# Patient Record
Sex: Female | Born: 1955 | Race: White | Hispanic: No | Marital: Married | Smoking: Former smoker
Health system: Southern US, Community
[De-identification: ages and names within clinical notes are randomized; demographics above are authoritative.]

## PROBLEM LIST (undated history)

## (undated) DIAGNOSIS — M5431 Sciatica, right side: Secondary | ICD-10-CM

## (undated) DIAGNOSIS — M797 Fibromyalgia: Secondary | ICD-10-CM

## (undated) DIAGNOSIS — M1990 Unspecified osteoarthritis, unspecified site: Secondary | ICD-10-CM

## (undated) DIAGNOSIS — I639 Cerebral infarction, unspecified: Secondary | ICD-10-CM

## (undated) DIAGNOSIS — Z87442 Personal history of urinary calculi: Secondary | ICD-10-CM

## (undated) DIAGNOSIS — I10 Essential (primary) hypertension: Secondary | ICD-10-CM

## (undated) DIAGNOSIS — F329 Major depressive disorder, single episode, unspecified: Secondary | ICD-10-CM

## (undated) DIAGNOSIS — R87619 Unspecified abnormal cytological findings in specimens from cervix uteri: Secondary | ICD-10-CM

## (undated) DIAGNOSIS — Z8673 Personal history of transient ischemic attack (TIA), and cerebral infarction without residual deficits: Secondary | ICD-10-CM

## (undated) DIAGNOSIS — F32A Depression, unspecified: Secondary | ICD-10-CM

## (undated) DIAGNOSIS — F419 Anxiety disorder, unspecified: Secondary | ICD-10-CM

## (undated) DIAGNOSIS — I493 Ventricular premature depolarization: Secondary | ICD-10-CM

## (undated) DIAGNOSIS — K219 Gastro-esophageal reflux disease without esophagitis: Secondary | ICD-10-CM

## (undated) DIAGNOSIS — N189 Chronic kidney disease, unspecified: Secondary | ICD-10-CM

## (undated) HISTORY — DX: Personal history of transient ischemic attack (TIA), and cerebral infarction without residual deficits: Z86.73

## (undated) HISTORY — DX: Essential (primary) hypertension: I10

## (undated) HISTORY — PX: APPENDECTOMY: SHX54

## (undated) HISTORY — DX: Major depressive disorder, single episode, unspecified: F32.9

## (undated) HISTORY — DX: Anxiety disorder, unspecified: F41.9

## (undated) HISTORY — PX: ADENOIDECTOMY: SUR15

## (undated) HISTORY — DX: Sciatica, right side: M54.31

## (undated) HISTORY — DX: Unspecified abnormal cytological findings in specimens from cervix uteri: R87.619

## (undated) HISTORY — DX: Depression, unspecified: F32.A

## (undated) HISTORY — PX: WRIST SURGERY: SHX841

## (undated) HISTORY — DX: Chronic kidney disease, unspecified: N18.9

## (undated) HISTORY — PX: TUBAL LIGATION: SHX77

## (undated) HISTORY — PX: BUNIONECTOMY: SHX129

## (undated) HISTORY — DX: Ventricular premature depolarization: I49.3

## (undated) HISTORY — PX: TONSILLECTOMY: SUR1361

---

## 1998-09-27 ENCOUNTER — Ambulatory Visit (HOSPITAL_COMMUNITY): Admission: RE | Admit: 1998-09-27 | Discharge: 1998-09-27 | Payer: Self-pay | Admitting: Neurosurgery

## 1998-09-27 ENCOUNTER — Encounter: Payer: Self-pay | Admitting: Neurosurgery

## 1999-04-21 ENCOUNTER — Inpatient Hospital Stay (HOSPITAL_COMMUNITY): Admission: EM | Admit: 1999-04-21 | Discharge: 1999-04-28 | Payer: Self-pay | Admitting: Neurosurgery

## 1999-04-22 ENCOUNTER — Encounter: Payer: Self-pay | Admitting: Neurosurgery

## 1999-04-26 ENCOUNTER — Encounter: Payer: Self-pay | Admitting: Neurosurgery

## 1999-09-17 ENCOUNTER — Ambulatory Visit (HOSPITAL_COMMUNITY): Admission: RE | Admit: 1999-09-17 | Discharge: 1999-09-17 | Payer: Self-pay | Admitting: Neurosurgery

## 1999-09-17 ENCOUNTER — Encounter: Payer: Self-pay | Admitting: Neurosurgery

## 2002-01-08 ENCOUNTER — Ambulatory Visit (HOSPITAL_COMMUNITY): Admission: RE | Admit: 2002-01-08 | Discharge: 2002-01-08 | Payer: Self-pay | Admitting: Unknown Physician Specialty

## 2002-01-08 ENCOUNTER — Encounter: Payer: Self-pay | Admitting: Unknown Physician Specialty

## 2002-01-29 ENCOUNTER — Encounter (HOSPITAL_COMMUNITY): Admission: RE | Admit: 2002-01-29 | Discharge: 2002-02-28 | Payer: Self-pay | Admitting: Orthopaedic Surgery

## 2002-02-27 ENCOUNTER — Encounter (HOSPITAL_COMMUNITY): Admission: RE | Admit: 2002-02-27 | Discharge: 2002-03-29 | Payer: Self-pay | Admitting: Orthopaedic Surgery

## 2002-03-30 ENCOUNTER — Encounter (HOSPITAL_COMMUNITY): Admission: RE | Admit: 2002-03-30 | Discharge: 2002-04-29 | Payer: Self-pay | Admitting: Orthopaedic Surgery

## 2002-04-29 ENCOUNTER — Encounter (HOSPITAL_COMMUNITY): Admission: RE | Admit: 2002-04-29 | Discharge: 2002-05-29 | Payer: Self-pay | Admitting: Orthopaedic Surgery

## 2003-02-02 ENCOUNTER — Inpatient Hospital Stay (HOSPITAL_COMMUNITY): Admission: AD | Admit: 2003-02-02 | Discharge: 2003-02-06 | Payer: Self-pay | Admitting: Pulmonary Disease

## 2003-07-07 ENCOUNTER — Ambulatory Visit (HOSPITAL_COMMUNITY): Admission: RE | Admit: 2003-07-07 | Discharge: 2003-07-07 | Payer: Self-pay | Admitting: Pulmonary Disease

## 2004-10-26 ENCOUNTER — Ambulatory Visit (HOSPITAL_COMMUNITY): Admission: RE | Admit: 2004-10-26 | Discharge: 2004-10-26 | Payer: Self-pay | Admitting: Pulmonary Disease

## 2004-10-27 ENCOUNTER — Ambulatory Visit: Payer: Self-pay | Admitting: *Deleted

## 2006-09-06 ENCOUNTER — Ambulatory Visit (HOSPITAL_COMMUNITY): Admission: RE | Admit: 2006-09-06 | Discharge: 2006-09-06 | Payer: Self-pay | Admitting: Pulmonary Disease

## 2006-11-13 ENCOUNTER — Ambulatory Visit (HOSPITAL_COMMUNITY): Admission: RE | Admit: 2006-11-13 | Discharge: 2006-11-13 | Payer: Self-pay | Admitting: Pulmonary Disease

## 2007-02-24 ENCOUNTER — Encounter (INDEPENDENT_AMBULATORY_CARE_PROVIDER_SITE_OTHER): Payer: Self-pay | Admitting: General Surgery

## 2007-02-24 ENCOUNTER — Observation Stay (HOSPITAL_COMMUNITY): Admission: RE | Admit: 2007-02-24 | Discharge: 2007-02-25 | Payer: Self-pay | Admitting: Obstetrics and Gynecology

## 2007-05-01 ENCOUNTER — Ambulatory Visit (HOSPITAL_COMMUNITY): Admission: RE | Admit: 2007-05-01 | Discharge: 2007-05-01 | Payer: Self-pay | Admitting: Pulmonary Disease

## 2007-05-05 ENCOUNTER — Ambulatory Visit (HOSPITAL_COMMUNITY): Admission: RE | Admit: 2007-05-05 | Discharge: 2007-05-05 | Payer: Self-pay | Admitting: Obstetrics and Gynecology

## 2007-09-26 ENCOUNTER — Ambulatory Visit (HOSPITAL_COMMUNITY): Admission: RE | Admit: 2007-09-26 | Discharge: 2007-09-26 | Payer: Self-pay | Admitting: Pulmonary Disease

## 2007-09-26 ENCOUNTER — Ambulatory Visit: Payer: Self-pay | Admitting: Cardiology

## 2007-09-30 ENCOUNTER — Ambulatory Visit (HOSPITAL_COMMUNITY): Admission: RE | Admit: 2007-09-30 | Discharge: 2007-09-30 | Payer: Self-pay | Admitting: Pulmonary Disease

## 2008-02-23 ENCOUNTER — Ambulatory Visit: Payer: Self-pay | Admitting: Orthopedic Surgery

## 2008-02-23 DIAGNOSIS — M21619 Bunion of unspecified foot: Secondary | ICD-10-CM

## 2008-03-23 ENCOUNTER — Ambulatory Visit: Admission: RE | Admit: 2008-03-23 | Discharge: 2008-03-23 | Payer: Self-pay | Admitting: Pulmonary Disease

## 2008-04-30 ENCOUNTER — Ambulatory Visit (HOSPITAL_COMMUNITY): Admission: RE | Admit: 2008-04-30 | Discharge: 2008-04-30 | Payer: Self-pay | Admitting: Pulmonary Disease

## 2008-05-06 ENCOUNTER — Ambulatory Visit (HOSPITAL_COMMUNITY): Admission: RE | Admit: 2008-05-06 | Discharge: 2008-05-06 | Payer: Self-pay | Admitting: Pulmonary Disease

## 2008-09-17 ENCOUNTER — Ambulatory Visit (HOSPITAL_COMMUNITY): Admission: RE | Admit: 2008-09-17 | Discharge: 2008-09-17 | Payer: Self-pay | Admitting: Pulmonary Disease

## 2008-10-07 ENCOUNTER — Emergency Department (HOSPITAL_COMMUNITY): Admission: EM | Admit: 2008-10-07 | Discharge: 2008-10-07 | Payer: Self-pay | Admitting: Emergency Medicine

## 2008-11-26 ENCOUNTER — Other Ambulatory Visit: Admission: RE | Admit: 2008-11-26 | Discharge: 2008-11-26 | Payer: Self-pay | Admitting: Obstetrics and Gynecology

## 2008-12-01 ENCOUNTER — Ambulatory Visit (HOSPITAL_COMMUNITY): Admission: RE | Admit: 2008-12-01 | Discharge: 2008-12-01 | Payer: Self-pay | Admitting: Obstetrics and Gynecology

## 2009-01-11 ENCOUNTER — Ambulatory Visit (HOSPITAL_COMMUNITY): Admission: RE | Admit: 2009-01-11 | Discharge: 2009-01-11 | Payer: Self-pay | Admitting: Obstetrics and Gynecology

## 2009-01-14 ENCOUNTER — Ambulatory Visit (HOSPITAL_COMMUNITY): Admission: RE | Admit: 2009-01-14 | Discharge: 2009-01-14 | Payer: Self-pay | Admitting: Obstetrics and Gynecology

## 2009-05-09 ENCOUNTER — Ambulatory Visit (HOSPITAL_COMMUNITY): Admission: RE | Admit: 2009-05-09 | Discharge: 2009-05-09 | Payer: Self-pay | Admitting: Obstetrics and Gynecology

## 2010-01-21 ENCOUNTER — Emergency Department (HOSPITAL_COMMUNITY): Admission: EM | Admit: 2010-01-21 | Discharge: 2010-01-21 | Payer: Self-pay | Admitting: Emergency Medicine

## 2010-06-13 ENCOUNTER — Ambulatory Visit (HOSPITAL_COMMUNITY): Admission: RE | Admit: 2010-06-13 | Discharge: 2010-06-13 | Payer: Self-pay | Admitting: Obstetrics and Gynecology

## 2010-09-10 ENCOUNTER — Encounter: Payer: Self-pay | Admitting: Neurology

## 2010-11-28 LAB — SEDIMENTATION RATE: Sed Rate: 6 mm/hr (ref 0–22)

## 2010-11-28 LAB — BASIC METABOLIC PANEL
BUN: 11 mg/dL (ref 6–23)
CO2: 25 mEq/L (ref 19–32)
Calcium: 9.3 mg/dL (ref 8.4–10.5)
Chloride: 102 mEq/L (ref 96–112)
Creatinine, Ser: 0.71 mg/dL (ref 0.4–1.2)
GFR calc Af Amer: >60 mL/min (ref >60)
GFR calc non Af Amer: >60 mL/min (ref >60)
Glucose, Bld: 93 mg/dL (ref 70–99)
Potassium: 4.5 mEq/L (ref 3.5–5.1)
Sodium: 135 mEq/L (ref 135–145)

## 2010-11-28 LAB — CBC
HCT: 37.5 % (ref 36.0–46.0)
Hemoglobin: 12.9 g/dL (ref 12.0–15.0)
MCHC: 34.5 g/dL (ref 30.0–36.0)
MCV: 84.9 fL (ref 78.0–100.0)
Platelets: 331 10*3/uL (ref 150–400)
RBC: 4.42 MIL/uL (ref 3.87–5.11)
RDW: 15.8 % — ABNORMAL HIGH (ref 11.5–15.5)
WBC: 10.7 10*3/uL — ABNORMAL HIGH (ref 4.0–10.5)

## 2011-01-02 NOTE — H&P (Signed)
NAMEJALAYA, [name]           ACCOUNT NO.:  [id number]   MEDICAL RECORD NO.:  [id number]          PATIENT TYPE:  AMB   LOCATION:  DAY                           FACILITY:  [facility]   PHYSICIAN:  Tilda Burrow, M.D. DATE OF BIRTH:  19-Oct-1955   DATE OF ADMISSION:  DATE OF DISCHARGE:  LH                              HISTORY & PHYSICAL   ADMISSION DIAGNOSES:  1. Pelvic relaxation with rectocele.  2. Chronic fatigue syndrome.  3. 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   HISTORY OF PRESENT ILLNESS:  This 55 year old female is admitted at this  time for posterior repair for symptomatic rectocele which is interfering  with defecation. She also has a small hemorrhoid skin tag which he needs  removed at the same time. Dr. Lovell Sheehan will be consulted to take care of  this. General medical history is extensive. She has a history of a  suspected fibromyalgia managed by Dr. Sandria Manly and Dr. Juanetta Gosling. She sees Dr.  Loa Socks, and she is know to Dr. Channing Mutters for her concerns over deterioration  of vision in the right eye. Her general energy levels have decreased  markedly over the past few years, and with the last decade, she has gone  from high energy back to a person who is someone who sleeps 12 hours a  day and usually sleeps on a sofa. She complains of losing motor strength  in her right hand with dropping of things. She never feels caught up on  rest and has a low stamina. 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  Dr. Nile Riggs has seen her. She now complains of having a foggy sensation  in her right eye.   MEDICATIONS:  1. Cymbalta 60 mg at bedtime.  2. Omeprazole 40 mg p.o. daily along with sodium bicarbonate.  3. Albuterol as taken daily.  4. She is on Pulmicort 180 mcg 4 puffs twice daily.   ALLERGIES:  SHE CANNOT TAKE LYRICA AS IT INCREASES HER ASTHMA.   SOCIAL HISTORY:  She is married.  Four children at home  ages 64, 66, 27,  and 52.   GENERAL EXAM:  Shows a cheerful, moderately overweight, Caucasian female  who is alert and oriented x3 but quite aware of her changes in her  overall condition.  Pupils are equal, round, reactive.  NECK:  Supple.  CHEST:  Clear to auscultation.  ABDOMEN:  Without masses.  EXTERNAL GENITALIA:  Lax introitus. Rectocele present which is  symptomatic. Vaginal apex is well supported.  EXTREMITIES:  Are within normal limits without clubbing, cyanosis, or  edema. She walks without any gait abnormalities in the office.   PLAN:  Posterior repair February 24, 2007, removal of hemorrhoidal skin tag  by Dr. Lovell Sheehan when available.   The patient has questions regarding Lyme disease today. Will investigate  this with patient after surgery.      Tilda Burrow, M.D.  Electronically Signed     JVF/MEDQ  D:  02/20/2007  T:  02/21/2007  Job:  [id number]  cc:   [facility]

## 2011-01-02 NOTE — Op Note (Signed)
[name], [name]           ACCOUNT NO.:  [id number]   MEDICAL RECORD NO.:  [id number]          PATIENT TYPE:  INP   LOCATION:  A208                          FACILITY:  [facility]   PHYSICIAN:  Tilda Burrow, M.D. DATE OF BIRTH:  1956-03-13   DATE OF PROCEDURE:  02/24/2007  DATE OF DISCHARGE:                               OPERATIVE REPORT   PREOPERATIVE DIAGNOSES:  1. Rectocele, symptomatic.  2. External hemorrhoids.   POSTOPERATIVE DIAGNOSES:  1. Rectocele, symptomatic.  2. External hemorrhoids.  3. Central pelvic mass.   PROCEDURES:  1. Posterior repair by Tilda Burrow, M.D.  2. Hemorrhoidectomy x2 by Dalia Heading, M.D.   ANESTHESIA:  General anesthesia.   COMPLICATIONS:  None.   FINDINGS:  Generalized lax pelvic tissues, responding nicely to  posterior repair.  External hemorrhoids removed as per Dr. Lovell Sheehan'  note.  Examination under anesthesia reveals a central pelvic fullness  very high in the midline that cannot be clearly delineated on  examination under anesthesia, only the inferior aspects of firmness, I  do not believe it to be the sacral promontory but will follow up with CT  postoperatively.   DESCRIPTION OF PROCEDURE:  Patient was taken to the operating room,  prepped and draped for vaginal procedure.  Dr. Lovell Sheehan proceeded with  hemorrhoidectomy as per his notes dictated elsewhere.  On his completion  of the procedure, I replaced him in the central surgeon's position and  proceeded with posterior repair by opening the posterior perineal body  at the hymen remnants, elevating the vaginal mucosa off the underlying  fatty tissue and rectovaginal septum.  Lateral dissection was performed  to get on either side of the rectum, whereupon a double gloved digital  right index finger could be placed in the rectum and confirm  satisfactory dissection position.  Allis clamps could then be used to  grasp lateral pararectal tissue on either side. There was  more tissue on  the patient's left than right, indicating a pararectal defect was to the  right of the midline.  A series of 0 Vicryl pop-off interrupted sutures  were used to reapproximate the supportive tissues across the  rectovaginal septum and rebuild the perineal body.  The vaginal mucosa  just beneath the posterior fourchette was dissected free.  The first  layer consisted of a series of four interrupted 0 Vicryl sutures that  were placed, tagged and then after the double glove removed and replaced  with a sterile glove, we tied down the sutures with good construction of  a posterior perineal body.  The second layer was placed just near the  introitus rebuilding the perineum even further reducing introitus size  and elevating the perineal body nicely resulting in normal dimpling on  either side of the perineal body.  Vaginal mucosa was trimmed of a small  amount of redundant tissue and then 2-0 chromic used in an interrupted  fashion to close the vaginal mucosa.  Examination under anesthesia was  repeated and there was absolutely no evidence of any suspicion of  abnormality upon the area of surgical field.  Well above the surgical  field on deep bimanual exam, there was a sense of central mid pelvis  fullness that was difficult to delineate.  I do not believe this was  sacral promontory but there was a central firm irregular surface that  will warrant further investigation.  The ovaries could not be distinctly  felt on exam.  The patient then went to the recovery room with vaginal  packing in place and will be followed up with outpatient observation for  pain management overnight.   ADDENDUM:  Preoperatively, the patient complained of a bad weekend of  neck pain and weakness in her upper arms.  Consideration will be given  to an MRI of the head and neck while in the hospital.      Tilda Burrow, M.D.  Electronically Signed     JVF/MEDQ  D:  02/24/2007  T:  02/24/2007   Job:  [id number]

## 2011-01-02 NOTE — Op Note (Signed)
[name], [name]           ACCOUNT NO.:  [id number]   MEDICAL RECORD NO.:  [id number]          PATIENT TYPE:  AMB   LOCATION:  DAY                           FACILITY:  [facility]   PHYSICIAN:  Tilda Burrow, M.D. DATE OF BIRTH:  07/03/1956   DATE OF PROCEDURE:  DATE OF DISCHARGE:                               OPERATIVE REPORT   PREOPERATIVE DIAGNOSES:  1. Menorrhagia.2.  Dysmenorrhea.   POSTOPERATIVE DIAGNOSES:  1. Menorrhagia.2.  Dysmenorrhea.   PROCEDURE:  Hysteroscopy, dilation and curettage, and endometrial  ablation.   SURGEON:  Tilda Burrow, MD   ASSISTANT:  None.   ANESTHESIA:  General with LMA as well as paracervical block.   COMPLICATIONS:  None.   FINDINGS:  Uterus sounds to 10 cm; scanty, thin endometrium with minimal  tissue obtained on curettage.   INDICATIONS:  A 55 year old female with fibromyalgia, tolerating  periods, progressively worse as the disease progresses, requesting  endometrial ablation for dysmenorrhea.  Secretory endometrium identified  on biopsy earlier during preop assessment.   DETAILS OF PROCEDURE:  The patient was taken to the operating room,  prepped and draped for vaginal procedure with time-out conducted and  confirmed.  Cervix was grasped with single-tooth tenaculum.  Uterus  sounded to 10 cm in the anteflexed position, dilated to 25-French,  allowing introduction of a rigid 30-degree operative hysteroscope with  good visualization of the endometrial cavity including visualization of  both tubal ostia.  Photo documentation of 1 and 2 shows the thin  atrophic endometrium of the fundus.  Photos 3 and 4 showed a lower  uterine segment.  Minimal tissue was obtained on curettage and was  discarded based on prior normal biopsy.  The patient then had the  Gynecare ThermaChoice III endometrial ablation device primed, inserted,  and filled with 14 mL of D5W and 8-minute thermal ablation sequence  activated.  Upon removal of all 14  mL of fluid, the device was  removed and the procedure considered successfully completed without  complications.  Sponge and needle counts were correct.  The patient had  paracervical block with 10 mL of Marcaine solution injected and went to  recovery room in stable condition.      Tilda Burrow, M.D.  Electronically Signed     JVF/MEDQ  D:  01/14/2009  T:  01/15/2009  Job:  [id number]   cc:   Ramon Dredge L. Juanetta Gosling, M.D.  Fax: [phone]   [facility]

## 2011-01-02 NOTE — Procedures (Signed)
NAMEASYAH, CANDLER           ACCOUNT NO.:  [id number]   MEDICAL RECORD NO.:  [id number]          PATIENT TYPE:  OUT   LOCATION:  RAD                           FACILITY:  [facility]   PHYSICIAN:  Gerrit Friends. Dietrich Pates, MD, FACCDATE OF BIRTH:  02/06/1956   DATE OF PROCEDURE:  09/26/2007  DATE OF DISCHARGE:  09/26/2007                                ECHOCARDIOGRAM   CLINICAL DATA:  A 55 year old woman with dyspnea and tachycardia.   M-MODE:  Aorta 3.1, left atrium 3.5, septum 1.6, posterior wall 1.6, LV  diastole 3.1, LV systole 2.8.   1. Technically adequate echocardiographic study.  2. Normal left atrium, right atrium and right ventricle.  3. Normal aortic valve; normal proximal ascending aorta.  4. Normal mitral and tricuspid valve; physiologic tricuspid      regurgitation.  5. Normal left ventricular size; mild hypertrophy; normal regional and      global function.  6. Normal IVC.      Gerrit Friends. Dietrich Pates, MD, [facility]  Electronically Signed     RMR/MEDQ  D:  09/26/2007  T:  09/29/2007  Job:  [id number]

## 2011-01-02 NOTE — Procedures (Signed)
[name], HLADIK           ACCOUNT NO.:  [id number]   MEDICAL RECORD NO.:  [id number]          PATIENT TYPE:  OUT   LOCATION:  SLEE                          FACILITY:  [facility]   PHYSICIAN:  Kofi A. Gerilyn Pilgrim, M.D. DATE OF BIRTH:  08/12/1956   DATE OF PROCEDURE:  03/23/2008  DATE OF DISCHARGE:  03/23/2008                             SLEEP DISORDER REPORT   POLYSOMNOGRAPHY REPORT   REFERRING PHYSICIAN:  Edward L. Juanetta Gosling, MD.   INDICATIONS:  This is a 55 year old lady who presents with daytime  sleepiness and fatigue.  She is being evaluated for possible obstructive  status syndrome.   MEDICATIONS:  Azor, Symbicort, and Ventolin.   Epworth sleepiness scale 13.  BMI 3.   SLEEP STAGE SUMMARY:  The total recording time is 380 minutes.  Sleep  efficiency 85%.  Sleep latency 17 minutes.  REM latency 61 minutes.  Stage N1 10%, N2 61%, N3 0%, and REM sleep 30%.   RESPIRATORY SUMMARY:  Baseline oxygen saturation 98%.  Lowest saturation  91%.  AHI 0.7.   LIMB MOVEMENT SUMMARY:  The PLM index is 0.   ELECTROCARDIOGRAM SUMMARY:  Average heart rate 71 with no arrhythmias  observed.   IMPRESSION:  There is abnormal architecture with increased REM and early  REM latency.  Given the history of hypersomnia, I will suggest sleep  consultation.   Thank you for this referral.      Kofi A. Gerilyn Pilgrim, M.D.  Electronically Signed     KAD/MEDQ  D:  03/29/2008  T:  03/29/2008  Job:  [id number]

## 2011-01-02 NOTE — H&P (Signed)
NAMEJUNI, [name]           ACCOUNT NO.:  [id number]   MEDICAL RECORD NO.:  [id number]          PATIENT TYPE:  AMB   LOCATION:  DAY                           FACILITY:  [facility]   PHYSICIAN:  Tilda Burrow, M.D. DATE OF BIRTH:  [birth year]   DATE OF ADMISSION:  DATE OF DISCHARGE:  LH                              HISTORY & PHYSICAL   ADMISSION DIAGNOSES:  Menorrhagia, dysmenorrhea, fibromyalgia.   HISTORY OF PRESENT ILLNESS:  This 55 year old female well known to our  practice, status post several deliveries over the years, tubal ligation  as well as posterior vaginal repair, who was admitted at this time for D  and C and endometrial ablation.  She has noticed her periods are  becoming more uncomfortable, periods lasting 5 days with cramps and  passage of clots with size of quarter.  This has increased  __________aged and her fibromyalgia has gotten worse.  She has no  intermenstrual bleeding or post coital bleeding.   PAST MEDICAL HISTORY:  1. Positive for fibromyalgia, managed by Dr. Juanetta Gosling.  2. History of multiple sclerosis versus optic neuritis.  3. Asthma.  4. GERD.  5. Weight gain since age 84, turning moderate obesity with BMI of 32.   PHYSICAL EXAMINATION:  VITAL SIGNS:  Weight 210.2, BMI 32, blood  pressure 120/70, height 5 feet 9.5 inches.  HEENT:  Pupils equal, round, and reactive.  NECK:  Supple.  CHEST:  Clear to auscultation.  ABDOMEN:  Normal.  BREASTS:  Normal at last office visit.  ABDOMEN:  Moderate obesity without masses or hernias.  EXTERNAL GENITALIA:  Normal multiparous female with adequate pelvic  support,  menstrual blood in introitus.  Vaginal exam shows adequate  pelvic support, small cervix, nonpurulent.  Posterior vaginal wall  support is adequate, bladder is adequately supported.   MEDICATIONS:  1. Oxycodone 60 mg p.o. nightly, Dr. Juanetta Gosling.  2. Nexium 40 mg daily.  3. Lotrel 10/40 p.o. daily.  4. Bystolic 10 mg p.o. daily.  5. Symbicort  2 tablets daily.   Pelvic ultrasound shows a small uterus with normal follicular  premenstrual endometrium 7 mm in thickness only.  Uterus that measures  9.8 length x 3.9 transverse x 5.9 width with normal ovaries and no  pelvic abnormalities otherwise noted.   PLAN:  Hysteroscopy, D and C, endometrial ablation at [facility] on 11 Jan 2009 at 7:30.      Tilda Burrow, M.D.  Electronically Signed     JVF/MEDQ  D:  01/07/2009  T:  01/08/2009  Job:  [id number]   cc:   [facility]   Oneal Deputy. Juanetta Gosling, M.D.  Fax: [phone]

## 2011-01-02 NOTE — Procedures (Signed)
Lisa, Atkinson           ACCOUNT NO.:  [id number]   MEDICAL RECORD NO.:  [id number]          PATIENT TYPE:  OUT   LOCATION:  RESP                          FACILITY:  [facility]   PHYSICIAN:  Edward L. Juanetta Gosling, M.D.DATE OF BIRTH:  [birth year]   DATE OF PROCEDURE:  09/30/2007  DATE OF DISCHARGE:  09/30/2007                            PULMONARY FUNCTION TEST   1. Spirometry shows no ventilatory defect but evidence of airflow      obstruction.  2. Lung volumes show supranormal lung volume.  3. DLCO is normal.  4. Arterial blood gases are normal.  5. There is no significant bronchodilator response.  This study is      similar to the study of November 13, 2006.      Edward L. Juanetta Gosling, M.D.  Electronically Signed     ELH/MEDQ  D:  10/03/2007  T:  10/04/2007  Job:  [id number]

## 2011-01-05 NOTE — Group Therapy Note (Signed)
   NAME:  [name], KAMM                     ACCOUNT NO.:  [id number]   MEDICAL RECORD NO.:  [id number]                   PATIENT TYPE:  INP   LOCATION:  A219                                 FACILITY:  [facility]   PHYSICIAN:  Edward L. Juanetta Gosling, M.D.             DATE OF BIRTH:  05-29-1956   DATE OF PROCEDURE:  02/05/2003  DATE OF DISCHARGE:                                   PROGRESS NOTE   PROBLEMS:  1. Asthma.  2. Unilateral vision loss.   SUBJECTIVE:  Ms. Wilcher continues to be short of breath and coughing.  In  the past she has cleared very quickly when she has been on maximum steroid  treatment, etc, so my concern is that she may have something else going on  such as pulmonary emboli.  I have discussed this with Dr. Tyron Russell in the  radiology department.  We plan to do a CT of the chest.  In addition to that  she is complaining that she has something stuck in her throat which has been  there and seems to be precipitating some of her problems.  She also has a  unilateral vision loss which is still a problem.  My plan is to go ahead and  have an MRI of the brain done, go ahead with CT of the chest, continue with  her other treatments, and follow.   OBJECTIVE:  Her physical exam today shows her temperature is 99.2, pulse  117, respirations 20, blood pressure 111/58, O2 saturation 98% on room air.  Her chest is still with marked rhonchi.   PLAN:  My plan is to go ahead with the MRI, etc. as mentioned.  In addition  to that I am going to go ahead and order a BNP.                                               Edward L. Juanetta Gosling, M.D.    ELH/MEDQ  D:  02/05/2003  T:  02/05/2003  Job:  [id number]

## 2011-01-05 NOTE — Group Therapy Note (Signed)
   NAME:  [name], [name]                     ACCOUNT NO.:  [id number]   MEDICAL RECORD NO.:  [id number]                   PATIENT TYPE:  INP   LOCATION:  A219                                 FACILITY:  [facility]   PHYSICIAN:  Edward L. Juanetta Gosling, M.D.             DATE OF BIRTH:  15-Aug-1956   DATE OF PROCEDURE:  02/03/2003  DATE OF DISCHARGE:                                   PROGRESS NOTE   PROBLEMS:  1. Asthma with exacerbation.  2. Possible multiple sclerosis.  3. Back pain.   SUBJECTIVE:  [name] said that she is doing a little better as far as  her breathing is concerned but she is still having a lot of cough.  This  morning she has a great deal of back pain.  She is having spasms in the  back.   OBJECTIVE:  Her physical examination otherwise shows that her chest is  actually clearer than it has been, her heart is regular, her abdomen is  soft.  Her temperature is 98.2, pulse 126 but it actually got to 160  earlier, respirations 20, blood pressure 157/80, O2 saturation 100% on 2  liters.   ASSESSMENT:  She has spasm.   PLAN:  Continue her treatments and medications; no changes today.  I have  given her some morphine for her back spasms and we will put her on Flexeril  as well.                                               Edward L. Juanetta Gosling, M.D.    ELH/MEDQ  D:  02/03/2003  T:  02/03/2003  Job:  [id number]

## 2011-01-05 NOTE — Group Therapy Note (Signed)
   NAME:  [name], JOFFE                     ACCOUNT NO.:  [id number]   MEDICAL RECORD NO.:  [id number]                   PATIENT TYPE:  INP   LOCATION:  A219                                 FACILITY:  [facility]   PHYSICIAN:  Edward L. Juanetta Gosling, M.D.             DATE OF BIRTH:  [birth year]   DATE OF PROCEDURE:  DATE OF DISCHARGE:  02/06/2003                                   PROGRESS NOTE   PROBLEMS:  1. Asthma.  2. Unilateral blindness.   SUBJECTIVE:  [name] says she feels great and wants to go home.  She  still has some wheezes but not as bad.   OBJECTIVE:  Her physical exam shows her temperature is 98.5, pulse 103,  respirations 22, blood pressure 147/87, O2 saturation 98%.  Chest shows  rhonchi and minimal wheezes.   ASSESSMENT:  She is doing better.   PLAN:  To discharge her home today.  Please see discharge summary for  details.                                               Edward L. Juanetta Gosling, M.D.    ELH/MEDQ  D:  02/06/2003  T:  02/07/2003  Job:  [id number]

## 2011-01-05 NOTE — Group Therapy Note (Signed)
   NAME:  [name], [name]                     ACCOUNT NO.:  [id number]   MEDICAL RECORD NO.:  [id number]                   PATIENT TYPE:  INP   LOCATION:  A219                                 FACILITY:  [facility]   PHYSICIAN:  Edward L. Juanetta Gosling, M.D.             DATE OF BIRTH:  Dec 13, 1955   DATE OF PROCEDURE:  02/04/2003  DATE OF DISCHARGE:                                   PROGRESS NOTE   PROBLEMS:  1. Asthma.  2. Muscle spasm.   SUBJECTIVE:  [name] says she is feeling pretty well.  She has no  complaints.  She says that everything is going okay and she has no new  difficulties.  She says that she is the best she has been in at least two to  three weeks.   OBJECTIVE:  Exam shows that she looks comfortable.  Temperature is 98, pulse  96, respirations 22, blood pressure 123/69, O2 saturation 98% on 2 liters.  Her chest is clear today.  She does not seem to be having any more muscle  spasms.   ASSESSMENT:  She is much improved.   PLAN:  Discontinue her IV fluids, switch her to a heparin lock, try to get  her up and around, and then I will probably discharge her in the morning.                                               Edward L. Juanetta Gosling, M.D.    ELH/MEDQ  D:  02/04/2003  T:  02/04/2003  Job:  [id number]

## 2011-01-05 NOTE — Procedures (Signed)
NAMELEE, KALT           ACCOUNT NO.:  [id number]   MEDICAL RECORD NO.:  [id number]          PATIENT TYPE:  OUT   LOCATION:  RESP                          FACILITY:  [facility]   PHYSICIAN:  Edward L. Juanetta Gosling, M.D.DATE OF BIRTH:  [birth year]   DATE OF PROCEDURE:  11/13/2006  DATE OF DISCHARGE:                            PULMONARY FUNCTION TEST   PULMONARY FUNCTION TEST:  1. Spirometry shows no definite ventilatory defect but evidence of      significant airflow obstruction.  2. Lung volumes are normal.  3. DLCO is normal.  4. Arterial blood gases are normal.  5. There is significant bronchodilator improvement.      Edward L. Juanetta Gosling, M.D.  Electronically Signed     ELH/MEDQ  D:  11/13/2006  T:  11/14/2006  Job:  [id number]

## 2011-01-05 NOTE — Discharge Summary (Signed)
   NAME:  [name], [name]                     ACCOUNT NO.:  [id number]   MEDICAL RECORD NO.:  [id number]                   PATIENT TYPE:  INP   LOCATION:  A219                                 FACILITY:  [facility]   PHYSICIAN:  Edward L. Juanetta Gosling, M.D.             DATE OF BIRTH:  10-28-1955   DATE OF ADMISSION:  02/02/2003  DATE OF DISCHARGE:  02/06/2003                                 DISCHARGE SUMMARY   FINAL DISCHARGE DIAGNOSES:  1. Status asthmaticus.  2. Unilateral visual loss, etiology unknown.  3. Muscle spasms.   HISTORY:  [name] is a 55 year old who has had a long known history of  asthma.  She was in her usual state of fairly poor health at home, has had  increasing asthma for the last two weeks.  She has also had unilateral  visual loss and is being evaluated for that by Dr. Nile Riggs and Dr. Sandria Manly, the  neurologist, in [place].  She had been sick for several days and just  continued to get worse.  She came to my office clearly was not able to get  home and was admitted from there.   EXAM:  GENERAL:  Exam showed that she was wheezing markedly which was  audible across the room.  CHEST:  Showed wheezes and rhonchi.  HEART:  Regular without gallop.  She did have a tachycardia at about 120.  ABDOMEN:  Soft.   HOSPITAL COURSE:  She was started on IV steroids, antibiotics, given inhaled  bronchodilators.  She improved but then developed muscle spasm which  required acute treatment.  She had an MRI of the brain which did not show  any abnormalities in the brain.  She had a CT of the chest to rule out  pulmonary embolism which was negative.  By the 19th she was feeling much  better and although she was not clear she wanted to go home.   She was discharged home on prednisone 60 mg x 3 days, 50 mg x 3 days, 40 mg  x 3 days and with a continued progression.  She has Tussionex 5 cc q.12h.  p.r.n. cough.  She has antibiotic Avelox and she is going to followup with  Dr. Sandria Manly  next week, followup with my office in about two weeks.  She is also  going to be on Lasix 40 mg daily p.o. and Diflucan 150 mg x one dose.                                                Edward L. Juanetta Gosling, M.D.    ELH/MEDQ  D:  02/06/2003  T:  02/06/2003  Job:  [id number]

## 2011-01-05 NOTE — H&P (Signed)
   NAME:  [name], [name]                     ACCOUNT NO.:  [id number]   MEDICAL RECORD NO.:  [id number]                   PATIENT TYPE:  INP   LOCATION:  A219                                 FACILITY:  [facility]   PHYSICIAN:  Edward L. Juanetta Gosling, M.D.             DATE OF BIRTH:  1955-11-20   DATE OF ADMISSION:  02/02/2003  DATE OF DISCHARGE:                                HISTORY & PHYSICAL   PROBLEM:  Shortness of breath.   HISTORY:  [name] is a 55 year old who has a long known history of  asthma. She had been in her usual state of fairly poor health when about two  weeks ago, she developed increasing shortness of breath.  She became very  congested.  She has had a number of treatments including steroids, inhaled  bronchodilators, etc., but is just not getting much better.  She has  recently had some difficulty with vision in her right eye and saw a  neurologist who told her that she might have multiple sclerosis.  She had  received IV steroids at home for the possible MS but still had problems.   PAST MEDICAL HISTORY:  Positive for the asthma mostly.  She has had some  problems with her back as well.   ALLERGIES:  No known drug allergies.   SOCIAL HISTORY:  She is a nonsmoker.  She does not drink any alcohol.   PAST SURGICAL HISTORY:  Appendectomy and tonsillectomy.   FAMILY HISTORY:  Positive for asthma.   REVIEW OF SYSTEMS:  Except as mentioned is negative.   PHYSICAL EXAMINATION:  GENERAL:  Well developed, well nourished female who  is in no acute distress now.  VITAL SIGNS:  Temperature 97.6, pulse 93, respirations 26, blood pressure  156/68.  White count is 16,800, hemoglobin 13.6, platelets 318.  HEENT:  Pupils are equal, round, and reactive to light and accommodation.  Nose and throat are clear.  NECK:  Supple without masses.  CHEST: Clear without wheezes, rales, or rhonchi.  HEART:  Regular without murmur, gallop, or rub.  ABDOMEN:  Soft.   LABORATORY DATA:   Chest x-ray does not show pneumonia.   ASSESSMENT:  She has asthma.    PLAN:  Treat her with intravenous steroids, inhaled bronchodilators, and  antibiotics and see if she improves.  If she does not improve, she will  probably need CT scan of the chest.                                               Edward L. Juanetta Gosling, M.D.    ELH/MEDQ  D:  02/02/2003  T:  02/02/2003  Job:  [id number]

## 2011-05-11 LAB — BLOOD GAS, ARTERIAL
Acid-base deficit: 2.3 — ABNORMAL HIGH
Bicarbonate: 21.3
O2 Saturation: 97.8
Patient temperature: 37
TCO2: 19.3
pCO2 arterial: 32.4 — ABNORMAL LOW
pH, Arterial: 7.434 — ABNORMAL HIGH
pO2, Arterial: 97.6

## 2011-05-22 ENCOUNTER — Other Ambulatory Visit: Payer: Self-pay | Admitting: Obstetrics and Gynecology

## 2011-05-22 DIAGNOSIS — Z139 Encounter for screening, unspecified: Secondary | ICD-10-CM

## 2011-06-05 LAB — CBC
MCHC: 33
Platelets: 349
RBC: 4.48
WBC: 8.1

## 2011-06-05 LAB — DIFFERENTIAL
Basophils Relative: 1
Eosinophils Absolute: 0.2
Lymphs Abs: 1.4
Monocytes Relative: 9
Neutro Abs: 5.8
Neutrophils Relative %: 72

## 2011-06-05 LAB — BASIC METABOLIC PANEL
BUN: 9
Calcium: 8.8
Creatinine, Ser: 0.81
GFR calc Af Amer: >60

## 2011-06-05 LAB — HCG, SERUM, QUALITATIVE: Preg, Serum: NEGATIVE

## 2011-06-05 LAB — FERRITIN: Ferritin: 6 — ABNORMAL LOW (ref 10–291)

## 2011-06-05 LAB — TYPE AND SCREEN: ABO/RH(D): A POS

## 2011-06-15 ENCOUNTER — Ambulatory Visit (HOSPITAL_COMMUNITY)
Admission: RE | Admit: 2011-06-15 | Discharge: 2011-06-15 | Disposition: A | Discharge status: Home / Self Care | Payer: Medicare Other | Source: Ambulatory Visit | Attending: Obstetrics and Gynecology | Admitting: Obstetrics and Gynecology

## 2011-06-15 DIAGNOSIS — Z139 Encounter for screening, unspecified: Secondary | ICD-10-CM

## 2011-06-15 DIAGNOSIS — Z1231 Encounter for screening mammogram for malignant neoplasm of breast: Secondary | ICD-10-CM | POA: Insufficient documentation

## 2011-09-25 ENCOUNTER — Other Ambulatory Visit (HOSPITAL_COMMUNITY): Payer: Self-pay | Admitting: Pulmonary Disease

## 2011-09-25 DIAGNOSIS — G459 Transient cerebral ischemic attack, unspecified: Secondary | ICD-10-CM

## 2011-09-26 ENCOUNTER — Ambulatory Visit (HOSPITAL_COMMUNITY)
Admission: RE | Admit: 2011-09-26 | Discharge: 2011-09-26 | Disposition: A | Discharge status: Home / Self Care | Payer: Medicare Other | Source: Ambulatory Visit | Attending: Pulmonary Disease | Admitting: Pulmonary Disease

## 2011-09-26 DIAGNOSIS — G459 Transient cerebral ischemic attack, unspecified: Secondary | ICD-10-CM

## 2011-09-26 DIAGNOSIS — R209 Unspecified disturbances of skin sensation: Secondary | ICD-10-CM | POA: Insufficient documentation

## 2011-09-26 DIAGNOSIS — R51 Headache: Secondary | ICD-10-CM | POA: Insufficient documentation

## 2011-09-27 ENCOUNTER — Telehealth: Payer: Self-pay

## 2011-09-27 ENCOUNTER — Other Ambulatory Visit: Payer: Self-pay

## 2011-09-27 DIAGNOSIS — Z139 Encounter for screening, unspecified: Secondary | ICD-10-CM

## 2011-09-27 NOTE — Telephone Encounter (Signed)
Rx and instructions mailed.  

## 2011-09-27 NOTE — Telephone Encounter (Signed)
MOVI PREP SPLIT DOSING, REGULAR BREAKFAST. CLEAR LIQUIDS AFTER 9 AM.  

## 2011-09-27 NOTE — Telephone Encounter (Signed)
Gastroenterology Pre-Procedure Form   Request Date: 09/27/2011             Requesting Physician: Dr, Juanetta Gosling      PATIENT INFORMATION:  [name] is a 56 y.o., female (DOB=21-Mar-1956).  PROCEDURE: Procedure(s) requested: colonoscopy Procedure Reason: screening for colon cancer  PATIENT REVIEW QUESTIONS: The patient reports the following:   1. Diabetes Melitis: no 2. Joint replacements in the past 12 months: no 3. Major health problems in the past 3 months: no 4. Has an artificial valve or MVP:no 5. Has been advised in past to take antibiotics in advance of a procedure like teeth cleaning: no}    MEDICATIONS & ALLERGIES:    Patient reports the following regarding taking any blood thinners:   Plavix? no Aspirin?no Coumadin?  no  Patient confirms/reports the following medications:  Current Outpatient Prescriptions  Medication Sig Dispense Refill  . albuterol (PROVENTIL HFA;VENTOLIN HFA) 108 (90 BASE) MCG/ACT inhaler Inhale 2 puffs into the lungs every 6 (six) hours as needed.      . bisoprolol (ZEBETA) 5 MG tablet Take 5 mg by mouth daily.      . budesonide-formoterol (SYMBICORT) 160-4.5 MCG/ACT inhaler Inhale 2 puffs into the lungs 2 (two) times daily.      . chlorzoxazone (PARAFON) 500 MG tablet Take 500 mg by mouth 4 (four) times daily as needed. Takes only as needed      . esomeprazole (NEXIUM) 40 MG capsule Take 40 mg by mouth daily before breakfast.      . Milnacipran (SAVELLA) 50 MG TABS Take 50 mg by mouth once.      . NON FORMULARY Lidoderm patches/ as directed/ uses one every night      . topiramate (TOPAMAX) 50 MG tablet Take 50 mg by mouth 3 (three) times daily. Pt said she takes three 50 mg tablets bid      . traMADol (ULTRAM) 50 MG tablet Take 50 mg by mouth every 6 (six) hours as needed. Pt said she takes two tablets in the AM and two tablets in the PM        Patient confirms/reports the following allergies:  No Known Allergies  Patient is appropriate to  schedule for requested procedure(s): yes  AUTHORIZATION INFORMATION Primary Insurance:   ID #:   Group #:  Pre-Cert / Auth required: Pre-Cert / Auth #:   Secondary Insurance: ID #:   Group #:  Pre-Cert / Auth required:  Pre-Cert / Auth #:   No orders of the defined types were placed in this encounter.    SCHEDULE INFORMATION: Procedure has been scheduled as follows:  Date: 10/08/2011               Time: 1:45 PM  Location: [facility] Short Stay  This Gastroenterology Pre-Precedure Form is being routed to the following provider(s) for review: Jonette Eva, MD

## 2011-10-05 MED ORDER — SODIUM CHLORIDE 0.45 % IV SOLN
Freq: Once | INTRAVENOUS | Status: AC
  Administered 2011-10-08: 13:00:00 via INTRAVENOUS

## 2011-10-08 ENCOUNTER — Ambulatory Visit (HOSPITAL_COMMUNITY)
Admission: RE | Admit: 2011-10-08 | Discharge: 2011-10-08 | Disposition: A | Discharge status: Home / Self Care | Payer: Medicare Other | Source: Ambulatory Visit | Attending: Gastroenterology | Admitting: Gastroenterology

## 2011-10-08 ENCOUNTER — Other Ambulatory Visit: Payer: Self-pay | Admitting: Gastroenterology

## 2011-10-08 ENCOUNTER — Encounter (HOSPITAL_COMMUNITY)
Admission: RE | Disposition: A | Discharge status: Home / Self Care | Payer: Self-pay | Source: Ambulatory Visit | Attending: Gastroenterology

## 2011-10-08 ENCOUNTER — Encounter (HOSPITAL_COMMUNITY): Payer: Self-pay | Admitting: *Deleted

## 2011-10-08 DIAGNOSIS — Z139 Encounter for screening, unspecified: Secondary | ICD-10-CM

## 2011-10-08 DIAGNOSIS — K648 Other hemorrhoids: Secondary | ICD-10-CM

## 2011-10-08 DIAGNOSIS — Z1211 Encounter for screening for malignant neoplasm of colon: Secondary | ICD-10-CM | POA: Insufficient documentation

## 2011-10-08 DIAGNOSIS — D126 Benign neoplasm of colon, unspecified: Secondary | ICD-10-CM | POA: Insufficient documentation

## 2011-10-08 DIAGNOSIS — K5730 Diverticulosis of large intestine without perforation or abscess without bleeding: Secondary | ICD-10-CM

## 2011-10-08 HISTORY — DX: Fibromyalgia: M79.7

## 2011-10-08 HISTORY — PX: COLONOSCOPY: SHX5424

## 2011-10-08 SURGERY — COLONOSCOPY
Anesthesia: Moderate Sedation

## 2011-10-08 MED ORDER — MIDAZOLAM HCL 5 MG/5ML IJ SOLN
INTRAMUSCULAR | Status: AC
  Filled 2011-10-08: qty 10

## 2011-10-08 MED ORDER — MEPERIDINE HCL 100 MG/ML IJ SOLN
INTRAMUSCULAR | Status: AC
  Filled 2011-10-08: qty 1

## 2011-10-08 MED ORDER — MIDAZOLAM HCL 5 MG/5ML IJ SOLN
INTRAMUSCULAR | Status: DC | PRN
  Administered 2011-10-08: 2 mg via INTRAVENOUS
  Administered 2011-10-08: 1 mg via INTRAVENOUS
  Administered 2011-10-08: 2 mg via INTRAVENOUS

## 2011-10-08 MED ORDER — MEPERIDINE HCL 100 MG/ML IJ SOLN
INTRAMUSCULAR | Status: DC | PRN
  Administered 2011-10-08: 25 mg via INTRAVENOUS
  Administered 2011-10-08: 50 mg via INTRAVENOUS

## 2011-10-08 MED ORDER — STERILE WATER FOR IRRIGATION IR SOLN
Status: DC | PRN
  Administered 2011-10-08: 14:00:00

## 2011-10-08 NOTE — H&P (Signed)
  Primary Care Physician:  Fredirick Maudlin, MD, MD Primary Gastroenterologist:  Dr. Darrick Penna  Pre-Procedure History & Physical: HPI:  [name] is a 56 y.o. female here for COLON CANCER SCREENING.   Past Medical History  Diagnosis Date  . Fibromyalgia   . Asthma     Past Surgical History  Procedure Date  . Appendectomy   . Tonsillectomy   . Bunionectomy   . Tubal ligation     Prior to Admission medications   Medication Sig Start Date End Date Taking? Authorizing Provider  albuterol (PROVENTIL HFA;VENTOLIN HFA) 108 (90 BASE) MCG/ACT inhaler Inhale 2 puffs into the lungs every 6 (six) hours as needed.   Yes Historical Provider, MD  bisoprolol (ZEBETA) 5 MG tablet Take 5 mg by mouth daily.   Yes Historical Provider, MD  budesonide-formoterol (SYMBICORT) 160-4.5 MCG/ACT inhaler Inhale 2 puffs into the lungs 2 (two) times daily.   Yes Historical Provider, MD  esomeprazole (NEXIUM) 40 MG capsule Take 40 mg by mouth daily before breakfast.   Yes Historical Provider, MD  Milnacipran (SAVELLA) 50 MG TABS Take 50 mg by mouth once.   Yes Historical Provider, MD  NON FORMULARY Lidoderm patches/ as directed/ uses one every night   Yes Historical Provider, MD  topiramate (TOPAMAX) 50 MG tablet Take 50 mg by mouth 3 (three) times daily. Pt said she takes three 50 mg tablets bid   Yes Historical Provider, MD  traMADol (ULTRAM) 50 MG tablet Take 50 mg by mouth every 6 (six) hours as needed. Pt said she takes two tablets in the AM and two tablets in the PM   Yes Historical Provider, MD  chlorzoxazone (PARAFON) 500 MG tablet Take 500 mg by mouth 4 (four) times daily as needed. Takes only as needed    Historical Provider, MD    Allergies as of 09/27/2011  . (No Known Allergies)    History reviewed. No pertinent family history.  NO COLON CA OR POLYPS   History   Social History  . Marital Status: Married    Spouse Name: N/A    Number of Children: N/A  . Years of Education: N/A    Occupational History  . Not on file.   Social History Main Topics  . Smoking status: Never Smoker   . Smokeless tobacco: Not on file  . Alcohol Use: Yes  . Drug Use: No  . Sexually Active:    Other Topics Concern  . Not on file   Social History Narrative  . No narrative on file    Review of Systems: See HPI, otherwise negative ROS   Physical Exam: BP 124/80  Pulse 72  Temp(Src) 98.6 F (37 C) (Oral)  Resp 18  Ht 5\' 8"  (1.727 m)  Wt 140 lb (63.504 kg)  BMI 21.29 kg/m2  SpO2 100%  LMP 10/04/2011 General:   Alert,  pleasant and cooperative in NAD Head:  Normocephalic and atraumatic. Neck:  Supple;  Lungs:  Clear throughout to auscultation.    Heart:  Regular rate and rhythm. Abdomen:  Soft, nontender and nondistended. Normal bowel sounds, without guarding, and without rebound.   Neurologic:  Alert and  oriented x4;  grossly normal neurologically.  Impression/Plan:     AVERAGE RISK  PLAN: TCS TODAY

## 2011-10-08 NOTE — Discharge Instructions (Signed)
You had 1 small polyps removed. You have small internal hemorrhoids and diverticulosis.  Next colonoscopy in 10 years with 2 days of  Clear liquids. FOLLOW A HIGH FIBER DIET. AVOID ITEMS THAT CAUSE BLOATING. SEE INFO BELOW. YOUR BIOPSY RESULTS SHOULD BE BACK IN 7 DAYS.  Colonoscopy Care After Read the instructions outlined below and refer to this sheet in the next week. These discharge instructions provide you with general information on caring for yourself after you leave the hospital. While your treatment has been planned according to the most current medical practices available, unavoidable complications occasionally occur. If you have any problems or questions after discharge, call DR. Larrissa Stivers, [phone].  ACTIVITY  You may resume your regular activity, but move at a slower pace for the next 24 hours.   Take frequent rest periods for the next 24 hours.   Walking will help get rid of the air and reduce the bloated feeling in your belly (abdomen).   No driving for 24 hours (because of the medicine (anesthesia) used during the test).   You may shower.   Do not sign any important legal documents or operate any machinery for 24 hours (because of the anesthesia used during the test).    NUTRITION  Drink plenty of fluids.   You may resume your normal diet as instructed by your doctor.   Begin with a light meal and progress to your normal diet. Heavy or fried foods are harder to digest and may make you feel sick to your stomach (nauseated).   Avoid alcoholic beverages for 24 hours or as instructed.    MEDICATIONS  You may resume your normal medications.   WHAT YOU CAN EXPECT TODAY  Some feelings of bloating in the abdomen.   Passage of more gas than usual.   Spotting of blood in your stool or on the toilet paper  .  IF YOU HAD POLYPS REMOVED DURING THE COLONOSCOPY:  Eat a soft diet IF YOU HAVE NAUSEA, BLOATING, ABDOMINAL PAIN, OR VOMITING.    FINDING OUT THE  RESULTS OF YOUR TEST Not all test results are available during your visit. DR. Darrick Penna WILL CALL YOU WITHIN 7 DAYS OF YOUR PROCEDUE WITH YOUR RESULTS. Do not assume everything is normal if you have not heard from DR. Esther Bradstreet IN ONE WEEK, CALL HER OFFICE AT [phone].  SEEK IMMEDIATE MEDICAL ATTENTION AND CALL THE OFFICE: [phone] IF:  You have more than a spotting of blood in your stool.   Your belly is swollen (abdominal distention).   You are nauseated or vomiting.   You have a temperature over 101F.   You have abdominal pain or discomfort that is severe or gets worse throughout the day.  Polyps, Colon  A polyp is extra tissue that grows inside your body. Colon polyps grow in the large intestine. The large intestine, also called the colon, is part of your digestive system. It is a long, hollow tube at the end of your digestive tract where your body makes and stores stool. Most polyps are not dangerous. They are benign. This means they are not cancerous. But over time, some types of polyps can turn into cancer. Polyps that are smaller than a pea are usually not harmful. But larger polyps could someday become or may already be cancerous. To be safe, doctors remove all polyps and test them.   WHO GETS POLYPS? Anyone can get polyps, but certain people are more likely than others. You may have a greater chance of getting  polyps if:  You are over 50.   You have had polyps before.   Someone in your family has had polyps.   Someone in your family has had cancer of the large intestine.   Find out if someone in your family has had polyps. You may also be more likely to get polyps if you:   Eat a lot of fatty foods   Smoke   Drink alcohol   Do not exercise  Eat too much   TREATMENT  The caregiver will remove the polyp during sigmoidoscopy or colonoscopy.  PREVENTION There is not one sure way to prevent polyps. You might be able to lower your risk of getting them if you:  Eat  more fruits and vegetables and less fatty food.   Do not smoke.   Avoid alcohol.   Exercise every day.   Lose weight if you are overweight.   Eating more calcium and folate can also lower your risk of getting polyps. Some foods that are rich in calcium are milk, cheese, and broccoli. Some foods that are rich in folate are chickpeas, kidney beans, and spinach.   High-Fiber Diet A high-fiber diet changes your normal diet to include more whole grains, legumes, fruits, and vegetables. Changes in the diet involve replacing refined carbohydrates with unrefined foods. The calorie level of the diet is essentially unchanged. The Dietary Reference Intake (recommended amount) for adult males is 38 grams per day. For adult females, it is 25 grams per day. Pregnant and lactating women should consume 28 grams of fiber per day. Fiber is the intact part of a plant that is not broken down during digestion. Functional fiber is fiber that has been isolated from the plant to provide a beneficial effect in the body. PURPOSE  Increase stool bulk.   Ease and regulate bowel movements.   Lower cholesterol.  INDICATIONS THAT YOU NEED MORE FIBER  Constipation and hemorrhoids.   Uncomplicated diverticulosis (intestine condition) and irritable bowel syndrome.   Weight management.   As a protective measure against hardening of the arteries (atherosclerosis), diabetes, and cancer.   GUIDELINES FOR INCREASING FIBER IN THE DIET  Start adding fiber to the diet slowly. A gradual increase of about 5 more grams (2 slices of whole-wheat bread, 2 servings of most fruits or vegetables, or 1 bowl of high-fiber cereal) per day is best. Too rapid an increase in fiber may result in constipation, flatulence, and bloating.   Drink enough water and fluids to keep your urine clear or pale yellow. Water, juice, or caffeine-free drinks are recommended. Not drinking enough fluid may cause constipation.   Eat a variety of  high-fiber foods rather than one type of fiber.   Try to increase your intake of fiber through using high-fiber foods rather than fiber pills or supplements that contain small amounts of fiber.   The goal is to change the types of food eaten. Do not supplement your present diet with high-fiber foods, but replace foods in your present diet.  INCLUDE A VARIETY OF FIBER SOURCES  Replace refined and processed grains with whole grains, canned fruits with fresh fruits, and incorporate other fiber sources. White rice, white breads, and most bakery goods contain little or no fiber.   Brown whole-grain rice, buckwheat oats, and many fruits and vegetables are all good sources of fiber. These include: broccoli, Brussels sprouts, cabbage, cauliflower, beets, sweet potatoes, white potatoes (skin on), carrots, tomatoes, eggplant, squash, berries, fresh fruits, and dried fruits.   Cereals  appear to be the richest source of fiber. Cereal fiber is found in whole grains and bran. Bran is the fiber-rich outer coat of cereal grain, which is largely removed in refining. In whole-grain cereals, the bran remains. In breakfast cereals, the largest amount of fiber is found in those with "bran" in their names. The fiber content is sometimes indicated on the label.   You may need to include additional fruits and vegetables each day.   In baking, for 1 cup white flour, you may use the following substitutions:   1 cup whole-wheat flour minus 2 tablespoons.   1/2 cup white flour plus 1/2 cup whole-wheat flour.   Diverticulosis Diverticulosis is a common condition that develops when small pouches (diverticula) form in the wall of the colon. The risk of diverticulosis increases with age. It happens more often in people who eat a low-fiber diet. Most individuals with diverticulosis have no symptoms. Those individuals with symptoms usually experience belly (abdominal) pain, constipation, or loose stools (diarrhea).  HOME CARE  INSTRUCTIONS  Increase the amount of fiber in your diet as directed by your caregiver or dietician. This may reduce symptoms of diverticulosis.   Drink at least 6 to 8 glasses of water each day to prevent constipation.   Try not to strain when you have a bowel movement.   Avoiding nuts and seeds to prevent complications is still an uncertain benefit.       FOODS HAVING HIGH FIBER CONTENT INCLUDE:  Fruits. Apple, peach, pear, tangerine, raisins, prunes.   Vegetables. Brussels sprouts, asparagus, broccoli, cabbage, carrot, cauliflower, romaine lettuce, spinach, summer squash, tomato, winter squash, zucchini.   Starchy Vegetables. Baked beans, kidney beans, lima beans, split peas, lentils, potatoes (with skin).   Grains. Whole wheat bread, brown rice, bran flake cereal, plain oatmeal, white rice, shredded wheat, bran muffins.    SEEK IMMEDIATE MEDICAL CARE IF:  You develop increasing pain or severe bloating.   You have an oral temperature above 101F.   You develop vomiting or bowel movements that are bloody or black.   Hemorrhoids Hemorrhoids are dilated (enlarged) veins around the rectum. Sometimes clots will form in the veins. This makes them swollen and painful. These are called thrombosed hemorrhoids. Causes of hemorrhoids include:  Constipation.   Straining to have a bowel movement.   HEAVY LIFTING HOME CARE INSTRUCTIONS  Eat a well balanced diet and drink 6 to 8 glasses of water every day to avoid constipation. You may also use a bulk laxative.   Avoid straining to have bowel movements.   Keep anal area dry and clean.   Do not use a donut shaped pillow or sit on the toilet for long periods. This increases blood pooling and pain.   Move your bowels when your body has the urge; this will require less straining and will decrease pain and pressure.

## 2011-10-10 NOTE — Op Note (Signed)
[facility] [address] [place], [place]  [address]  COLONOSCOPY PROCEDURE REPORT  PATIENT:  [name], [name]  MR#:  [id number] BIRTHDATE:  23-Jun-1956, 56 yrs. old  GENDER:  female  ENDOSCOPIST:  Jonette Eva, MD REF. BY:  Kari Baars, M.D. ASSISTANT:  PROCEDURE DATE:  10/08/2011 PROCEDURE:  Colonoscopy with biopsy  INDICATIONS:  SCREENING  MEDICATIONS:   Demerol 75 mg IV, Versed 5 mg IV  DESCRIPTION OF PROCEDURE:    Physical exam was performed. Informed consent was obtained from the patient after explaining the benefits, risks, and alternatives to procedure.  The patient was connected to monitor and placed in left lateral position. Continuous oxygen was provided by nasal cannula and IV medicine administered through an indwelling cannula.  After administration of sedation and rectal exam, the patient's rectum was intubated and the EC-[id number] ([id number]) colonoscope was advanced under direct visualization to the cecum.  The scope was removed slowly by carefully examining the color, texture, anatomy, and integrity mucosa on the way out.  The patient was recovered in endoscopy and discharged home in satisfactory condition. <<PROCEDUREIMAGES>>  FINDINGS:  A 4 MM sessile polyp was found in the cecum & REMOVED VIA COLD FORCEPS.  FREQUENT Diverticula were found throughout the colon.  SMALL Internal Hemorrhoids were found.  PREP QUALITY: GOOD CECAL W/D TIME:    27 minutes  COMPLICATIONS:    None  ENDOSCOPIC IMPRESSION: 1) Sessile polyp in the cecum 2) Diverticula throughout the colon 3) Internal hemorrhoids  RECOMMENDATIONS: TCS IN 10 YEARS WITH 2 DAYS OF CLEAR LIQUIDS HIGH FIBER DIET AWAIT BIOPSIES  REPEAT EXAM:  No  ______________________________ Jonette Eva, MD  CC:  Kari Baars, M.D.  n. eSIGNED:   Shikita Vaillancourt at 10/10/2011 07:40 AM  Eda Keys, [phone]

## 2011-10-12 ENCOUNTER — Telehealth: Payer: Self-pay | Admitting: Gastroenterology

## 2011-10-12 NOTE — Telephone Encounter (Signed)
Records Cc to PCP & 99yr reminder is in the computer

## 2011-10-12 NOTE — Telephone Encounter (Signed)
Please call pt. She had A HYPERPLASTIC POLYP removed from her colon. TCS in 10 years. High fiber diet.  

## 2011-10-15 NOTE — Telephone Encounter (Signed)
LMOM to call.

## 2011-10-16 ENCOUNTER — Encounter (HOSPITAL_COMMUNITY): Payer: Self-pay | Admitting: Gastroenterology

## 2011-10-16 NOTE — Telephone Encounter (Signed)
Called and informed pt.  

## 2012-01-23 ENCOUNTER — Other Ambulatory Visit: Payer: Self-pay | Admitting: Neurology

## 2012-01-23 DIAGNOSIS — R200 Anesthesia of skin: Secondary | ICD-10-CM

## 2012-01-26 ENCOUNTER — Ambulatory Visit
Admission: RE | Admit: 2012-01-26 | Discharge: 2012-01-26 | Disposition: A | Discharge status: Home / Self Care | Payer: Medicare Other | Source: Ambulatory Visit | Attending: Neurology | Admitting: Neurology

## 2012-01-26 DIAGNOSIS — R200 Anesthesia of skin: Secondary | ICD-10-CM

## 2012-06-03 ENCOUNTER — Other Ambulatory Visit: Payer: Self-pay | Admitting: Obstetrics and Gynecology

## 2012-06-03 DIAGNOSIS — Z139 Encounter for screening, unspecified: Secondary | ICD-10-CM

## 2012-06-16 ENCOUNTER — Ambulatory Visit (HOSPITAL_COMMUNITY)
Admission: RE | Admit: 2012-06-16 | Discharge: 2012-06-16 | Disposition: A | Discharge status: Home / Self Care | Payer: Medicare Other | Source: Ambulatory Visit | Attending: Obstetrics and Gynecology | Admitting: Obstetrics and Gynecology

## 2012-06-16 DIAGNOSIS — Z139 Encounter for screening, unspecified: Secondary | ICD-10-CM

## 2012-06-16 DIAGNOSIS — Z1231 Encounter for screening mammogram for malignant neoplasm of breast: Secondary | ICD-10-CM | POA: Insufficient documentation

## 2012-12-01 ENCOUNTER — Ambulatory Visit (HOSPITAL_COMMUNITY)
Admission: RE | Admit: 2012-12-01 | Discharge: 2012-12-01 | Disposition: A | Discharge status: Home / Self Care | Payer: Medicare Other | Source: Ambulatory Visit | Attending: Rheumatology | Admitting: Rheumatology

## 2012-12-01 DIAGNOSIS — IMO0001 Reserved for inherently not codable concepts without codable children: Secondary | ICD-10-CM | POA: Insufficient documentation

## 2012-12-01 DIAGNOSIS — M6281 Muscle weakness (generalized): Secondary | ICD-10-CM | POA: Insufficient documentation

## 2012-12-01 DIAGNOSIS — M545 Low back pain, unspecified: Secondary | ICD-10-CM | POA: Insufficient documentation

## 2012-12-01 NOTE — Evaluation (Signed)
Physical Therapy Evaluation  Patient Details  Name: [name] MRN: [id number] Date of Birth: August 16, 1956  Today's Date: 12/01/2012 Time: 1478-2956 PT Time Calculation (min): 39 min Charges: 1 evaluation             Visit#: 1 of 8  Re-eval: 12/31/12 Assessment Diagnosis: Cervical - DDD, Lumbar/ITB pain Next MD Visit: Dr, Corliss Skains - unscheulded  Authorization: Medicare    Authorization Time Period:    Authorization Visit#: 1 of 10   Past Medical History:  Past Medical History  Diagnosis Date  . Fibromyalgia   . Asthma    Past Surgical History:  Past Surgical History  Procedure Laterality Date  . Appendectomy    . Tonsillectomy    . Bunionectomy    . Tubal ligation    . Colonoscopy  10/08/2011    Procedure: COLONOSCOPY;  Surgeon: Arlyce Harman, MD;  Location: [facility];  Service: Endoscopy;  Laterality: N/A;  1:45 PM    Subjective Symptoms/Limitations Symptoms: Significant PMH: Fibromyaligia, migraines Pertinent History: Pt reports that she has back pain for as long as she can remember.  She recently had 2 cortisone shots to her neck and rt hip. C/o is pain, difficulty going up and down steps, increased stumbling, stiffnes throughout the day. How long can you sit comfortably?: less than 30 minutes How long can you stand comfortably?: 10 minutes  How long can you walk comfortably?: 1/4 mile Patient Stated Goals: "Decrease pain, be able to go up and steps."  Pain Assessment Currently in Pain?: Yes Pain Score:   7 Pain Location: Back Pain Type: Chronic pain Pain Onset: More than a month ago Pain Frequency: Constant Pain Relieving Factors: Pain medication Effect of Pain on Daily Activities: difficulty   Precautions/Restrictions  Precautions Precautions: None  Balance Screening Balance Screen Has the patient fallen in the past 6 months: No ("stumbling into my walls") Has the patient had a decrease in activity level because of a fear of falling? :  No Is the patient reluctant to leave their home because of a fear of falling? : No  Prior Functioning  Home Living Lives With: Family Prior Function Comments: She is a mother of 5 children with 2 children at home. States she likes to read occoasionally.   Sensation/Coordination/Flexibility/Functional Tests Coordination Gross Motor Movements are Fluid and Coordinated: No Coordination and Movement Description: requires VC and TC for Transverese abdominous (TrA) awareness, VC for Pelvic Floor (PF) control.  Has imporved TrA contraction with PF contraction.  Hodges Score: TrA: 1/10, PF: 3/10 Flexibility Thomas: Positive 90/90: Positive (Positive Ely's)  Assessment RLE Assessment RLE Assessment: Within Functional Limits LLE Assessment LLE Assessment: Within Functional Limits Cervical Assessment Cervical Assessment: Within Functional Limits Lumbar AROM Lumbar Flexion: decreased 25% with increased pain to her R side Lumbar Extension: decreased 50% Lumbar - Right Side Bend: WNL - no pain Lumbar - Left Side Bend: WNL - stretching to Rt side Palpation Palpation: moderate fascial restrictions to Rt lumbar and gluteal region, Upper trapezius and deltoid muscle  Mobility/Balance  Ambulation/Gait Ambulation/Gait: Yes Gait Pattern: Antalgic Posture/Postural Control Posture/Postural Control: Postural limitations Postural Limitations: slouched Static Standing Balance Static Standing - Comment/# of Minutes: impaired hip and ankle strategy. Single Leg Stance - Right Leg: 5 Single Leg Stance - Left Leg: 4 Tandem Stance - Right Leg: 8 Tandem Stance - Left Leg: 10 Rhomberg - Eyes Opened: 10 Rhomberg - Eyes Closed: 10   Exercise/Treatments Stretches Piriformis Stretch: 1 rep;30 seconds (supine knee to  opp shoulder) Seated Other Seated Lumbar Exercises: Heel and Toe Roll in and outs 2x10 sec holds each Supine Ab Set: Limitations AB Set Limitations: NMR with VC and TC unable to  independently contract Bridge: 10 reps Other Supine Lumbar Exercises: PF: 3x5 sec holds  Physical Therapy Assessment and Plan PT Assessment and Plan Clinical Impression Statement: Pt is a 57 year old female referred to PT for low back DDD with significant hx of fibromyalgia and "fibro fog" causing her difficulty to retain information.  Pt will benefit from skilled therapeutic intervention in order to improve on the following deficits: Decreased balance;Pain;Decreased range of motion;Decreased mobility;Increased fascial restricitons;Impaired perceived functional ability;Impaired flexibility;Decreased strength Rehab Potential: Good PT Frequency: Min 2X/week PT Duration: 6 weeks PT Treatment/Interventions: Functional mobility training;Therapeutic activities;Therapeutic exercise;Balance training;Neuromuscular re-education;Patient/family education;Manual techniques;Modalities PT Plan: Continue with core NMR (TrA, multifidius and PF),  Continue to progress LE functional strength (progress towards stair training). Continue with manual techniques to cervical and lumbar/pirifromis region (light pressure)    Goals Home Exercise Program Pt will Perform Home Exercise Program: Independently PT Goal: Perform Home Exercise Program - Progress: Goal set today PT Short Term Goals Time to Complete Short Term Goals: 3 weeks PT Short Term Goal 1: Pt will report pain less than a 5/10 for 75% of her day.  PT Short Term Goal 2: Pt will improve her independent core coordination in order to present with appropriate posture.  PT Long Term Goals Time to Complete Long Term Goals:  (6 weeks) PT Long Term Goal 1: Pt will improve her core strength in order to ascend and descend steps with alternating pattern with out increased pain for greater ease when entering family and friends households.  PT Long Term Goal 2: Pt will present with minimal fascial restrictions and tolerate moderate pressure to her Rt low back region for  imporved QOL.  Long Term Goal 3: Pt will verablaize approprirate posture in order to decrease secondary impairments.   Problem List Patient Active Problem List  Diagnosis  . BUNION  . Low back pain    PT Plan of Care PT Home Exercise Plan: see scanned report PT Patient Instructions: importance of posture, core muscles, increasing time for herself,  and answered questions about diagnosis.  Consulted and Agree with Plan of Care: Patient  GP Functional Assessment Tool Used: ODI: 58% Functional Limitation: Self care Self Care Current Status (Z6109): At least 60 percent but less than 80 percent impaired, limited or restricted Self Care Goal Status (U0454): At least 40 percent but less than 60 percent impaired, limited or restricted   Levada Bowersox, PT 12/01/2012, 1:25 PM  Physician Documentation Your signature is required to indicate approval of the treatment plan as stated above.  Please sign and either send electronically or make a copy of this report for your files and return this physician signed original.   Please mark one 1.__approve of plan  2. ___approve of plan with the following conditions.   ______________________________                                                          _____________________ Physician Signature  Date  

## 2012-12-03 ENCOUNTER — Ambulatory Visit (HOSPITAL_COMMUNITY)
Admission: RE | Admit: 2012-12-03 | Discharge: 2012-12-03 | Disposition: A | Discharge status: Home / Self Care | Payer: Medicare Other | Source: Ambulatory Visit | Attending: Rheumatology | Admitting: Rheumatology

## 2012-12-03 NOTE — Progress Notes (Signed)
Physical Therapy Treatment Patient Details  Name: [name] MRN: [id number] Date of Birth: 08-17-1956  Today's Date: 12/03/2012 Time: 4098-1191 PT Time Calculation (min): 41 min  Visit#: 2 of 8  Re-eval: 12/31/12 Charges: Therex x 38'  Authorization: Medicare  Authorization Visit#: 2 of 10   Subjective: Symptoms/Limitations Symptoms: Pt states that the cold weather has increased her fibro pain. Pain Assessment Currently in Pain?: Yes Pain Score:   5 Pain Location: Back Pain Orientation: Right   Exercise/Treatments Stretches Piriformis Stretch: 2 reps;30 seconds;Limitations Piriformis Stretch Limitations: knee to opposite shoulder Seated Other Seated Lumbar Exercises: Heel and Toe Roll in and outs 5x5 seconds Supine Ab Set: 10 reps AB Set Limitations: NMR with VC and TC; required head lift to contract Bent Knee Raise: 10 reps Bridge: 10 reps Other Supine Lumbar Exercises:  (HEP) Sidelying Clam: 10 reps Hip Abduction: 10 reps Prone  Straight Leg Raise: 10 reps  Physical Therapy Assessment and Plan PT Assessment and Plan Clinical Impression Statement: Pt has moderate difficulty contracting transverse abdominus secondary to decrease strength and coordination. Pt requires head lift to facilitate contraction. Pt completes all other exercises with minima difficulty after initial cueing. HEP given.  Pt will benefit from skilled therapeutic intervention in order to improve on the following deficits: Pain;Decreased range of motion;Decreased mobility;Increased fascial restricitons;Impaired perceived functional ability;Impaired flexibility;Decreased strength;Decreased balance Rehab Potential: Good PT Frequency: Min 2X/week PT Duration: 6 weeks PT Treatment/Interventions: Functional mobility training;Therapeutic activities;Therapeutic exercise;Balance training;Neuromuscular re-education;Patient/family education;Manual techniques;Modalities PT Plan: Continue with core NMR  (TrA, multifidius and PF),  Continue to progress LE functional strength (progress towards stair training). Continue with manual techniques to cervical and lumbar/pirifromis region (light pressure)     Problem List Patient Active Problem List  Diagnosis  . BUNION  . Low back pain    PT - End of Session Activity Tolerance: Patient tolerated treatment well General Behavior During Therapy: WFL for tasks assessed/performed Cognition: WFL for tasks performed  GP Functional Assessment Tool Used: ODI: 58%  Seth Bake, PTA  12/03/2012, 9:56 AM

## 2012-12-08 ENCOUNTER — Ambulatory Visit (HOSPITAL_COMMUNITY)
Admission: RE | Admit: 2012-12-08 | Discharge: 2012-12-08 | Disposition: A | Discharge status: Home / Self Care | Payer: Medicare Other | Source: Ambulatory Visit | Attending: Rheumatology | Admitting: Rheumatology

## 2012-12-08 NOTE — Progress Notes (Addendum)
Physical Therapy Treatment Patient Details  Name: [name] MRN: [id number] Date of Birth: 01-21-1956  Today's Date: 12/08/2012 Time: 4696-2952 PT Time Calculation (min): 41 min  Visit#: 3 of 8  Re-eval: 12/31/12 Charges: Therex x 39'  Authorization: Medicare  Authorization Visit#: 3 of 10   Subjective: Symptoms/Limitations Symptoms: Pt states that her neck is hurting today but she's not having any pain in her back currently. Pain Assessment Currently in Pain?: Yes Pain Score:   5 Pain Location: Back Pain Orientation: Right   Exercise/Treatments Supine Ab Set: 10 reps AB Set Limitations: NMR with VC and TC; required head lift to contract Bent Knee Raise: 10 reps Bridge: 10 reps Isometric Hip Flexion: 10 reps;5 seconds Sidelying Clam: 10 reps Hip Abduction: 10 reps Prone  Straight Leg Raise: 10 reps  Physical Therapy Assessment and Plan PT Assessment and Plan Clinical Impression Statement: Pt continues to require multimodal cueing to initiate transverse abdominus but displays improved contraction once facilitated. Pt also requires multimodal cueing for proper form with side lying clams. Pt will benefit from skilled therapeutic intervention in order to improve on the following deficits: Pain;Decreased range of motion;Decreased mobility;Increased fascial restricitons;Impaired perceived functional ability;Impaired flexibility;Decreased strength;Decreased balance Rehab Potential: Good PT Frequency: Min 2X/week PT Duration: 6 weeks PT Treatment/Interventions: Functional mobility training;Therapeutic activities;Therapeutic exercise;Balance training;Neuromuscular re-education;Patient/family education;Manual techniques;Modalities PT Plan: Continue with core NMR (TrA, multifidius and PF),  Continue to progress LE functional strength (progress towards stair training). Continue with manual techniques to cervical and lumbar/pirifromis region (light pressure)     Problem  List Patient Active Problem List  Diagnosis  . BUNION  . Low back pain    PT - End of Session Activity Tolerance: Patient tolerated treatment well General Behavior During Therapy: WFL for tasks assessed/performed Cognition: WFL for tasks performed   Seth Bake, PTA  12/08/2012, 9:35 AM

## 2012-12-10 ENCOUNTER — Ambulatory Visit (HOSPITAL_COMMUNITY)
Admission: RE | Admit: 2012-12-10 | Discharge: 2012-12-10 | Disposition: A | Discharge status: Home / Self Care | Payer: Medicare Other | Source: Ambulatory Visit | Attending: Rheumatology | Admitting: Rheumatology

## 2012-12-10 NOTE — Progress Notes (Signed)
Physical Therapy Treatment Patient Details  Name: [name] MRN: [id number] Date of Birth: 08/20/1956  Today's Date: 12/10/2012 Time: 0802-0845 PT Time Calculation (min): 43 min Charge: Therex 12'  Visit#: 4 of 8  Re-eval: 12/31/12    Authorization: Medicare  Authorization Time Period:    Authorization Visit#: 4 of 10   Subjective: Symptoms/Limitations Symptoms: Pt reported increased stress with family medical issues.  Increased symptoms from fibromyaglia today. Pt stated LBP scale 5/10 Pain Assessment Currently in Pain?: Yes Pain Score:   5 Pain Location: Back Pain Orientation: Right  Objective:   Exercise/Treatments Stretches Piriformis Stretch: 2 reps;30 seconds;Limitations Piriformis Stretch Limitations: knee to opposite shoulder Seated Other Seated Lumbar Exercises: anterior/posterior SI rotation for multifiducs activation Supine Ab Set: 10 reps AB Set Limitations: NMR with VC and TC; required head lift to contract Bent Knee Raise: 10 reps Bridge: 10 reps Isometric Hip Flexion: 10 reps;5 seconds Other Supine Lumbar Exercises: NMR for PFC 5x 10" holds Sidelying Clam: 10 reps Hip Abduction: 10 reps Prone  Straight Leg Raise: 10 reps Other Prone Lumbar Exercises: multifidus NMR max vc/tc   Physical Therapy Assessment and Plan PT Assessment and Plan Clinical Impression Statement: Multimodal cueing for multifidus and transverse abdominis activation, improved activation with tactile cueing each rep.   PT Plan: Continue with core NMR (TrA, multifidius and PF),  Continue to progress LE functional strength (progress towards stair training). Continue with manual techniques to cervical and lumbar/pirifromis region (light pressure)    Goals    Problem List Patient Active Problem List  Diagnosis  . BUNION  . Low back pain    PT - End of Session Activity Tolerance: Patient tolerated treatment well General Behavior During Therapy: WFL for tasks  assessed/performed Cognition: WFL for tasks performed  GP    Juel Burrow 12/10/2012, 10:38 AM

## 2012-12-15 ENCOUNTER — Ambulatory Visit (HOSPITAL_COMMUNITY)
Admission: RE | Admit: 2012-12-15 | Discharge: 2012-12-15 | Disposition: A | Discharge status: Home / Self Care | Payer: Medicare Other | Source: Ambulatory Visit | Attending: Rheumatology | Admitting: Rheumatology

## 2012-12-15 NOTE — Progress Notes (Signed)
Physical Therapy Treatment Patient Details  Name: [name] MRN: [id number] Date of Birth: Aug 08, 1956  Today's Date: 12/15/2012 Time: 5643-3295 PT Time Calculation (min): 42 min Charge: Therex 24', Manual 18'  Visit#: 5 of 8  Re-eval: 12/31/12 Assessment Diagnosis: Cervical - DDD, Lumbar/ITB pain Next MD Visit: Dr, Corliss Skains - unscheulded  Authorization: Medicare  Authorization Time Period:    Authorization Visit#: 5 of 10   Subjective: Symptoms/Limitations Symptoms: Pt reported increased stress with brother medical problems.  Pt reported noncompliance with HEP due to family issues.  Pt reoprted LBP pain scale 8/10 today. Pain Assessment Currently in Pain?: Yes Pain Score:   8 Pain Location: Back Pain Orientation: Lower;Right  Objective:  Exercise/Treatments Seated Other Seated Lumbar Exercises: anterior/posterior SI rotation for multifiducs activation Supine Dead Bug: 10 reps;5 seconds Bridge: 10 reps Straight Leg Raise: 10 reps Other Supine Lumbar Exercises: NMR for PFC 3x 10" holds with increased TC and VC required  today Prone  Other Prone Lumbar Exercises: multifidus NMR max vc/tc 5x 10"  Manual Therapy Manual Therapy: Myofascial release Myofascial Release: Rt gluteal region in prone position f/b soft tissue massage x 18 minutes  Physical Therapy Assessment and Plan PT Assessment and Plan Clinical Impression Statement: Pt with increased difficulty with core activation today due to secondary stress with family issues.  Session focus on manual techniques to reduce fascial restrictions to Rt gluteal region for pain relief. PT Plan: Continue with core NMR (TrA, multifidius and PF),  Continue to progress LE functional strength (progress towards stair training). Continue with manual techniques to cervical and lumbar/pirifromis region (light pressure)    Goals    Problem List Patient Active Problem List  Diagnosis  . BUNION  . Low back pain    PT -  End of Session Activity Tolerance: Patient tolerated treatment well;Patient limited by pain General Behavior During Therapy: [facility] for tasks assessed/performed Cognition: WFL for tasks performed  GP    Juel Burrow 12/15/2012, 9:37 AM

## 2012-12-17 ENCOUNTER — Ambulatory Visit (HOSPITAL_COMMUNITY)
Admission: RE | Admit: 2012-12-17 | Discharge: 2012-12-17 | Disposition: A | Discharge status: Home / Self Care | Payer: Medicare Other | Source: Ambulatory Visit | Attending: Rheumatology | Admitting: Rheumatology

## 2012-12-17 NOTE — Progress Notes (Signed)
Physical Therapy Treatment Patient Details  Name: [name] MRN: [id number] Date of Birth: 24-May-1956  Today's Date: 12/17/2012 Time: 0840-0920 PT Time Calculation (min): 40 min  Visit#: 6 of 8  Re-eval: 12/31/12 Charges: Therex x 38'  Authorization: Medicare  Authorization Visit#: 6 of 10   Subjective: Symptoms/Limitations Symptoms: Pt reports HEP compliance. Pain Assessment Currently in Pain?: Yes Pain Score:   4 Pain Location: Back Pain Orientation: Lower   Exercise/Treatments Standing Scapular Retraction: 10 reps;Theraband Theraband Level (Scapular Retraction): Level 3 (Green) Row: 10 reps;Theraband Theraband Level (Row): Level 3 (Green) Shoulder Extension: 10 reps;Theraband Theraband Level (Shoulder Extension): Level 3 (Green) Supine Dead Bug: 10 reps;5 seconds Bridge: 15 reps Straight Leg Raise: 10 reps Sidelying Clam: 10 reps Hip Abduction: 15 reps Prone  Straight Leg Raise: 10 reps Other Prone Lumbar Exercises: multifidus NMR max vc/tc 5x 10"  Physical Therapy Assessment and Plan PT Assessment and Plan Clinical Impression Statement: Pt has improved tolerance for exercises this session secondary to decreased pain. Pt completes therex well with minimal vc's for technique. Began scapular tband exercises to improve posture. Pt reports no change in pain at end of session. PT Plan: Continue with core NMR (TrA, multifidius and PF),  Continue to progress LE functional strength (progress towards stair training). Continue with manual techniques to cervical and lumbar/pirifromis region (light pressure)     Problem List Patient Active Problem List   Diagnosis Date Noted  . Low back pain 12/01/2012  . BUNION 02/23/2008    PT - End of Session Activity Tolerance: Patient tolerated treatment well General Behavior During Therapy: [facility] for tasks assessed/performed Cognition: WFL for tasks performed  Seth Bake, PTA  12/17/2012, 9:29 AM

## 2012-12-22 ENCOUNTER — Ambulatory Visit (HOSPITAL_COMMUNITY)
Admission: RE | Admit: 2012-12-22 | Discharge: 2012-12-22 | Disposition: A | Discharge status: Home / Self Care | Payer: Medicare Other | Source: Ambulatory Visit | Attending: Rheumatology | Admitting: Rheumatology

## 2012-12-22 DIAGNOSIS — M545 Low back pain, unspecified: Secondary | ICD-10-CM | POA: Insufficient documentation

## 2012-12-22 DIAGNOSIS — IMO0001 Reserved for inherently not codable concepts without codable children: Secondary | ICD-10-CM | POA: Insufficient documentation

## 2012-12-22 DIAGNOSIS — M6281 Muscle weakness (generalized): Secondary | ICD-10-CM | POA: Insufficient documentation

## 2012-12-22 NOTE — Evaluation (Signed)
Physical Therapy Re-Evaluation  Patient Details  Name: [name] MRN: [id number] Date of Birth: [birth year]  Today's Date: 12/22/2012 Time: 0840-0930 PT Time Calculation (min): 50 min Charges: 1 ROM, TE x30, Manual x18'             Visit#: 7 of 15  Re-eval: 01/21/13 Assessment Diagnosis: Cervical - DDD, Lumbar/ITB pain Next MD Visit: Dr, Corliss Skains - unscheulded  Authorization: Medicare    Authorization Time Period:    Authorization Visit#: 7 of 10   Subjective Symptoms/Limitations Symptoms: Pt states that her low back is doing well.  Reports increased pain and spasms to her right upper trapezius region.  States she is aware of her muscles when she is thinking about them, but is not using them when standing or transitioning.  Pain Assessment Currently in Pain?: Yes Pain Score:   7 Pain Location: Neck  Cognition/Observation Observation/Other Assessments Observations: able to ascend and descend steps 1 round trip with significant fatigue,  ascend and descending steps.   Cervical AROM Cervical - Left Side Bend: decreased 50% (was [facility]) Cervical - Right Rotation: decreased 50% WFL Lumbar AROM Lumbar Flexion: WNL  (was decreased 25% w/increased pain) Lumbar Extension: WNL (decreased 50%) Lumbar - Right Side Bend: WNL (no pain) Lumbar - Left Side Bend: WNL (WNL ) Palpation Palpation: without fascial restrictions to lumbar spine.  Significant fascial restrictions to cervical region to right upper trapezius muscle. decreased cervical 3-4 mobility.   Exercise/Treatments Stretches Upper Trapezius Stretch: 2 reps;20 seconds Levator Stretch: 2 reps;20 seconds Corner Stretch: 2 reps;30 seconds Machines for Strengthening UBE (Upper Arm Bike): 4 minutes backwards 1.0 for postural strength Theraband Exercises Shoulder Extension: 10 reps;Red Rows: 15 reps;Red;Limitations Rows Limitations: TC and VC for contro; Stairs: alternating independent x10 steps w/reports of  fatigue Supine Straight Leg Raises: Both;5 reps;Limitations Straight Leg Raises Limitations: 5 sec holds Sidelying Hip ABduction: Both;5 reps;Limitations Hip ABduction Limitations: 5 sec holds Hip ADduction: Both;5 reps;Limitations Hip ADduction Limitations: 5 sec holds Prone  Hip Extension: Both;5 reps;Limitations Hip Extension Limitations: 5 sec holds   Manual Therapy Manual Therapy: Joint mobilization Joint Mobilization: C3-4 posterior-anterior to spinous process, C3-4 Rt trasnverese process anterior-posteior w/STM after to cervical region to decrease fascial restrictions and muscular spasms.   Physical Therapy Assessment and Plan PT Assessment and Plan Clinical Impression Statement: [name] has attended 7 OP PT visits over the past 4 weeks to address LBP/ITB pain and cervical pain with the following findings: she has significant improvement in core coordination and strength, improved lumbar AROM and decreased pain to lumbar region, she is now able to focus on her cervical pain and dcecreased motion.  Today with manual therapy she was able to have significant release to her cervical region and is able to demonstrate relaxation techniques to decrease her pain. She still has signiificant limitations with activity tolerance and has notable fatigue with climbing 10 steps.  Pt will benefit from skilled therapeutic intervention in order to improve on the following deficits: Pain;Decreased range of motion;Decreased activity tolerance Rehab Potential: Good PT Frequency: Min 2X/week PT Duration: 4 weeks PT Plan: Focus on cervical region and activity tolerance.  Benefit from [employer] next visit for UE and LE strengthening and improve activity tolerance.  Update HEP and provide with theraband and LE exercises performed today to improve activity tolerance and strength.     Goals Home Exercise Program Pt will Perform Home Exercise Program: Independently PT Goal: Perform Home Exercise Program -  Progress: Progressing toward goal PT  Short Term Goals Time to Complete Short Term Goals: 3 weeks PT Short Term Goal 1: Pt will report pain less than a 5/10 for 75% of her day.  PT Short Term Goal 1 - Progress: Progressing toward goal PT Short Term Goal 2: Pt will improve her independent core coordination in order to present with appropriate posture.  PT Short Term Goal 2 - Progress: Met PT Long Term Goals Time to Complete Long Term Goals:  (6 weeks) PT Long Term Goal 1: Pt will improve her core strength in order to ascend and descend steps with alternating pattern with out increased pain for greater ease when entering family and friends households.  PT Long Term Goal 1 - Progress: Progressing toward goal PT Long Term Goal 2: Pt will present with minimal fascial restrictions and tolerate moderate pressure to her Rt low back region for imporved QOL.  PT Long Term Goal 2 - Progress: Met Long Term Goal 3: Pt will verablaize approprirate posture in order to decrease secondary impairments.  Long Term Goal 3 Progress: Progressing toward goal  Problem List Patient Active Problem List   Diagnosis Date Noted  . Low back pain 12/01/2012  . BUNION 02/23/2008    PT - End of Session Activity Tolerance: Patient tolerated treatment well General Behavior During Therapy: WFL for tasks assessed/performed Cognition: WFL for tasks performed PT Plan of Care PT Patient Instructions: pt independent with postural awareness.  discussed continuing with diaphragmatic breathing, core activitation and awareness in standing.  Consulted and Agree with Plan of Care: Patient  GP    Annett Fabian, MPT, ATC 12/22/2012, 10:05 AM  Physician Documentation Your signature is required to indicate approval of the treatment plan as stated above.  Please sign and either send electronically or make a copy of this report for your files and return this physician signed original.   Please mark one 1.__approve of plan  2.  ___approve of plan with the following conditions.   ______________________________                                                          _____________________ Physician Signature                                                                                                             Date

## 2012-12-24 ENCOUNTER — Ambulatory Visit (HOSPITAL_COMMUNITY): Payer: Medicare Other | Admitting: Physical Therapy

## 2012-12-29 ENCOUNTER — Ambulatory Visit (HOSPITAL_COMMUNITY)
Admission: RE | Admit: 2012-12-29 | Discharge: 2012-12-29 | Disposition: A | Discharge status: Home / Self Care | Payer: Medicare Other | Source: Ambulatory Visit | Attending: Rheumatology | Admitting: Rheumatology

## 2012-12-29 NOTE — Progress Notes (Signed)
Physical Therapy Treatment Patient Details  Name: [name] MRN: [id number] Date of Birth: [birth year]  Today's Date: 12/29/2012 Time: 7829-5621 PT Time Calculation (min): 46 min  Visit#: 8 of 15  Re-eval: 01/21/13 Charges: Therex x 32' Manual x 8'  Authorization: Medicare  Authorization Visit#: 8 of 10   Subjective: Symptoms/Limitations Symptoms: Pt states that her pain is always about a six and she's used to it.  Pain Assessment Currently in Pain?: Yes Pain Score:   6 Pain Location: Neck Pain Orientation: Lower  Precautions/Restrictions     Exercise/Treatments Mobility/Balance        Stretches Upper Trapezius Stretch: 2 reps;30 seconds Levator Stretch: 2 reps;30 seconds Corner Stretch: 2 reps;30 seconds Machines for Strengthening UBE (Upper Arm Bike): 4 minutes backwards 1.0 for postural strength Theraband Exercises Shoulder Extension: 15 reps;Red Rows: 15 reps;Green Supine Straight Leg Raises: 10 reps;Both Straight Leg Raises Limitations: 5 sec holds Sidelying Hip ABduction: 10 reps Hip ABduction Limitations: 5 sec holds Hip ADduction: 10 reps Hip ADduction Limitations: 5 sec holds Prone  Hip Extension: 10 reps Hip Extension Limitations: 5 sec holds   Manual Therapy Manual Therapy: Joint mobilization Joint Mobilization: C3-4 posterior-anterior to spinous process, C3-4 Rt trasnverese process anterior-posteior w/STM after to cervical region to decrease fascial restrictions and muscular spasms. (Completed by Annett Fabian, PT)  Physical Therapy Assessment and Plan PT Assessment and Plan Clinical Impression Statement: Pt completes mat exercises with good control and form. Pt requires multimodal cueing with scapular tband exercises. Manual therapy completed at end of session by Annett Fabian, PT. Pt had difficulty relaxing this session. Pt wound benefit from being in a private room with fewer distractions. Pt will benefit from skilled therapeutic  intervention in order to improve on the following deficits: Pain;Decreased range of motion;Decreased activity tolerance Rehab Potential: Good PT Frequency: Min 2X/week PT Duration: 4 weeks PT Plan: Focus on cervical region and activity tolerance.  Benefit from NuStep next visit for UE and LE strengthening and improve activity tolerance.  Update HEP and provide with theraband and LE exercises performed today to improve activity tolerance and strength.      Problem List Patient Active Problem List   Diagnosis Date Noted  . Low back pain 12/01/2012  . BUNION 02/23/2008    PT - End of Session Activity Tolerance: Patient tolerated treatment well General Behavior During Therapy: [facility] for tasks assessed/performed Cognition: WFL for tasks performed  Seth Bake, PTA  12/29/2012, 9:50 AM

## 2012-12-31 ENCOUNTER — Ambulatory Visit (HOSPITAL_COMMUNITY): Payer: Medicare Other

## 2013-01-02 ENCOUNTER — Telehealth (HOSPITAL_COMMUNITY): Payer: Self-pay

## 2013-01-05 ENCOUNTER — Ambulatory Visit (HOSPITAL_COMMUNITY): Payer: Medicare Other | Admitting: *Deleted

## 2013-01-06 ENCOUNTER — Telehealth (HOSPITAL_COMMUNITY): Payer: Self-pay

## 2013-01-07 ENCOUNTER — Ambulatory Visit (HOSPITAL_COMMUNITY): Payer: Medicare Other | Admitting: *Deleted

## 2013-01-14 ENCOUNTER — Ambulatory Visit (HOSPITAL_COMMUNITY): Payer: Medicare Other | Admitting: Physical Therapy

## 2013-01-16 ENCOUNTER — Ambulatory Visit (HOSPITAL_COMMUNITY): Payer: Medicare Other

## 2013-01-19 ENCOUNTER — Other Ambulatory Visit (HOSPITAL_COMMUNITY): Payer: Self-pay | Admitting: Obstetrics and Gynecology

## 2013-01-21 ENCOUNTER — Other Ambulatory Visit (HOSPITAL_COMMUNITY): Payer: Self-pay | Admitting: Obstetrics and Gynecology

## 2013-01-21 DIAGNOSIS — F988 Other specified behavioral and emotional disorders with onset usually occurring in childhood and adolescence: Secondary | ICD-10-CM

## 2013-01-22 ENCOUNTER — Telehealth: Payer: Self-pay | Admitting: *Deleted

## 2013-01-26 NOTE — Telephone Encounter (Signed)
RX sent to pharmacy for phentermine

## 2013-03-05 ENCOUNTER — Other Ambulatory Visit (HOSPITAL_COMMUNITY): Payer: Self-pay | Admitting: Obstetrics and Gynecology

## 2013-03-05 DIAGNOSIS — R5382 Chronic fatigue, unspecified: Secondary | ICD-10-CM

## 2013-03-09 ENCOUNTER — Telehealth: Payer: Self-pay | Admitting: Obstetrics and Gynecology

## 2013-03-09 NOTE — Telephone Encounter (Signed)
Patient aware that Rx is here at office.

## 2013-03-09 NOTE — Telephone Encounter (Signed)
Patient will need to be notified to pick up rx.

## 2013-07-24 ENCOUNTER — Other Ambulatory Visit (HOSPITAL_COMMUNITY): Payer: Self-pay | Admitting: Pulmonary Disease

## 2013-07-24 ENCOUNTER — Ambulatory Visit (HOSPITAL_COMMUNITY)
Admission: RE | Admit: 2013-07-24 | Discharge: 2013-07-24 | Disposition: A | Discharge status: Home / Self Care | Payer: Medicare Other | Source: Ambulatory Visit | Attending: Pulmonary Disease | Admitting: Pulmonary Disease

## 2013-07-24 DIAGNOSIS — M25519 Pain in unspecified shoulder: Secondary | ICD-10-CM | POA: Insufficient documentation

## 2013-07-24 DIAGNOSIS — M25511 Pain in right shoulder: Secondary | ICD-10-CM

## 2013-07-29 ENCOUNTER — Encounter: Payer: Self-pay | Admitting: Orthopedic Surgery

## 2013-07-29 ENCOUNTER — Ambulatory Visit (INDEPENDENT_AMBULATORY_CARE_PROVIDER_SITE_OTHER): Payer: Medicare Other | Admitting: Orthopedic Surgery

## 2013-07-29 VITALS — Ht 68.0 in | Wt 146.0 lb

## 2013-07-29 DIAGNOSIS — M75102 Unspecified rotator cuff tear or rupture of left shoulder, not specified as traumatic: Secondary | ICD-10-CM

## 2013-07-29 DIAGNOSIS — M67919 Unspecified disorder of synovium and tendon, unspecified shoulder: Secondary | ICD-10-CM

## 2013-07-29 DIAGNOSIS — M7501 Adhesive capsulitis of right shoulder: Secondary | ICD-10-CM

## 2013-07-29 DIAGNOSIS — M7500 Adhesive capsulitis of unspecified shoulder: Secondary | ICD-10-CM

## 2013-07-29 NOTE — Patient Instructions (Addendum)
Call to arrange[facility]arpy at [facility]  Adhesive Capsulitis Sometimes the shoulder becomes stiff and is painful to move. Some people say it feels as if the shoulder is frozen in place. Because of this, the condition is called "frozen shoulder." Its medical name is adhesive capsulitis.  The shoulder joint is made up of strong connective tissue that attaches the ball of the humerus to the shallow shoulder socket. This strong connective tissue is called the joint capsule. This tissue can become stiff and swollen. That is when adhesive capsulitis sets in. CAUSES  It is not always clear just what the cause adhesive capsulitis. Possibilities include:  Injury to the shoulder joint.  Strain. This is a repetitive injury brought about by overuse.  Lack of use. Perhaps your arm or hand was otherwise injured. It might have been in a sling for awhile. Or perhaps you were not using it to avoid pain.  Referred pain. This is a sort of trick the body plays. You feel pain in the shoulder. But, the pain actually comes from an injury somewhere else in the body.  Long-standing health problems. Several diseases can cause adhesive capsulitis. They include diabetes, heart disease, stroke, thyroid problems, rheumatoid arthritis and lung disease.  Being a women older than 40. Anyone can develop adhesive capsulitis but it is most common in women in this age group. SYMPTOMS   Pain.  It occurs when the arm is moved.  Parts of the shoulder might hurt if they are touched.  Pain is worse at night or when resting.  Soreness. It might not be strong enough to be called pain. But, the shoulder aches.  The shoulder does not move freely.  Muscle spasms.  Trouble sleeping because of shoulder ache or pain. DIAGNOSIS  To decide if you have adhesive capsulitis, your healthcare provider will probably:  Ask about symptoms you have noticed.  Ask about your history of joint pain and anything that might have caused the  pain.  Ask about your overall health.  Use hands to feel your shoulder and neck.  Ask you to move your shoulder in specific directions. This may indicate the origin of the pain.  Order imaging tests; pictures of the shoulder. They help pinpoint the source of the problem. An X-ray might be used. For more detail, an MRI is often used. An MRI details the tendons, muscles and ligaments as well as the joint. TREATMENT  Adhesive capsulitis can be treated several ways. Most treatments can be done in a clinic or in your healthcare provider's office. Be sure to discuss the different options with your caregiver. They include:  Physical therapy. You will work on specific exercises to get your shoulder moving again. The exercises usually involve stretching. A physical therapist (a caregiver with special training) can show you what to do and what not to do. The exercises will need to be done daily.  Medication.  Over-the-counter medicines may relieve pain and inflammation (the body's way of reacting to injury or infection).  Corticosteroids. These are stronger drugs to reduce pain and inflammation. They are given by injection (shots) into the shoulder joint. Frequent treatment is not recommended.  Muscle relaxants. Medication may be prescribed to ease muscle spasms.  Treatment of underlying conditions. This means treating another condition that is causing your shoulder problem. This might be a rotator cuff (tendon) problem  Shoulder manipulation. The shoulder will be moved by your healthcare provider. You would be under general anesthesia (given a drug that puts you  to sleep). You would not feel anything. Sometimes the joint will be injected with salt water (saline) at high pressure to break down internal scarring in the joint capsule.  Surgery. This is rarely needed. It may be suggested in advanced cases after all other treatment has failed. PROGNOSIS  In time, most people recover from adhesive  capsulitis. Sometimes, however, the pain goes away but full movement of the shoulder does not return.  HOME CARE INSTRUCTIONS   Take any pain medications recommended by your healthcare provider. Follow the directions carefully.  If you have physical therapy, follow through with the therapist's suggestions. Be sure you understand the exercises you will be doing. You should understand:  How often the exercises should be done.  How many times each exercise should be repeated.  How long they should be done.  What other activities you should do, or not do.  That you should warm up before doing any exercise. Just 5 to 10 minutes will help. Small, gentle movements should get your shoulder ready for more.  Avoid high-demand exercise that involves your shoulder such as throwing. This type of exercise can make pain worse.  Consider using cold packs. Cold may ease swelling and pain. Ask your healthcare provider if a cold pack might help you. If so, get directions on how and when to use them. SEEK MEDICAL CARE IF:   You have any questions about your medications.  Your pain continues to increase. Document Released: 06/03/2009 Document Revised: 10/29/2011 Document Reviewed: 06/03/2009 [facility] Patient Information 2014 [place], [place].

## 2013-07-29 NOTE — Progress Notes (Signed)
Patient ID: [name], female   DOB: May 14, 1956, 57 y.o.   MRN: [id number]  Chief Complaint  Patient presents with  . Shoulder Pain    Right shoulder pain, no injury. Referred by Dr. Juanetta Gosling for consult and treat.    Consult requested by Dr. Kari Baars  Chief complaint right shoulder pain  Pain for 8 months. Gradual onset. She received 6 IM cortisone injections.  Symptoms pain constant throbbing right shoulder deltoid area. Intensity 10. Constant. No improvement even with oxycodone. Worse with movement. Loss of motion. Numbness in the upper arm. Denies neck pain other than chronic symptoms.  Review of systems fibromyalgia related muscle and joint pain otherwise normal  The past family and social history as recorded and reviewed  Vital signs Ht 5\' 8"  (1.727 m)  Wt 146 lb (66.225 kg)  BMI 22.20 kg/m2  LMP 07/14/2013   General appearance: Development, nutrition are normal. Body habitus small thin No gross deformities are noted and grooming normal.  Peripheral vascular system no swelling or varicose veins are noted and pulses are palpable without tenderness, temperature warm to touch no edema.  No palpable lymph nodes are noted in the cervical area or axillae.  The skin overlying the right and left shoulder cervical and thoracic spine is normal without rash, lesion or ulceration  Deep tendon reflexes are normal and equal. And pathologic reflexes such as Hoffman sign are negative.  Sensation remains normal.  The patient is oriented to person place and time, the mood and affect are normal  Ambulation remains normal  Cervical spine no mass or tenderness. Range of motion is normal. Muscle tone is normal. Skin is normal.  Right shoulder inspection reveals tenderness in the peri-acromial region without crepitation. Her external rotation is limited to 30 forward elevation limited active and passive to 110. Anterior subluxation test normal. Internal and external rotation  strength normal.   Left shoulder  inspection reveals no tenderness or malalignment. There is no crepitation. The range of motion remains full flexion internal and external rotation. Stability tests are normal in abduction external rotation inferior subluxation test as well as the posterior stress test. Manual muscle testing of the supraspinatus, internal and external rotators 5 over 5  Impingement sign is normal, Hawkins maneuver normal, a.c. joint stress test normal.  X-rays inconclusive for any acute process bone normal  Impression Encounter Diagnoses  Name Primary?  . Adhesive capsulitis, right Yes  . Rotator cuff syndrome of left shoulder     Our recommendations are for physical therapy and subacromial injection  Shoulder Injection Procedure Note   Pre-operative Diagnosis: right  RC Syndrome  Post-operative Diagnosis: same  Indications: pain   Anesthesia: ethyl chloride   Procedure Details   Verbal consent was obtained for the procedure. The shoulder was prepped withalcohol and the skin was anesthetized. A 20 gauge needle was advanced into the subacromial space through posterior approach without difficulty  The space was then injected with 3 ml 1% lidocaine and 1 ml of depomedrol. The injection site was cleansed with isopropyl alcohol and a dressing was applied.  Complications:  None; patient tolerated the procedure well.

## 2013-08-11 ENCOUNTER — Telehealth: Payer: Self-pay | Admitting: Orthopedic Surgery

## 2013-08-11 NOTE — Telephone Encounter (Signed)
Call received from [facility] Flo Shanks, therapist, ph# [phone].  Requests a separate order/prescription for Dexamethasone dilution, per orders for Iontophoresis, due to policy change regarding this medication. According to policy, patient must carry the medication back & forth to visits, and therapy facility can no longer keep there for patients.  A separate fax will be sent to our office outlining the policy change.  In addition, Flo Shanks notes that patient was prescribed a tens unit also.  They request a separate order/prescription for this item as well. Physical therapist also notes that patient has been seen as scheduled for evaluation, and is willing to proceed with the iontophoresis treatment, but has concerns about continuing physical therapy due to cost.  Her next scheduled appointment is 09/29/13.  Patient's ph# is [phone] (Home).  [facility] Ph#(Above) / Fax# [phone].  Please advise.

## 2013-08-17 NOTE — Telephone Encounter (Signed)
Routing to Dr Harrison 

## 2013-08-17 NOTE — Telephone Encounter (Signed)
ok 

## 2013-08-21 ENCOUNTER — Telehealth: Payer: Self-pay | Admitting: Orthopedic Surgery

## 2013-08-21 NOTE — Telephone Encounter (Signed)
Please call [facility], [facility] about Naoma Boxell therapy.  Left a message about Lyra's progress and also about needing  A prescription before continuing  Therapy. Jody's # [phone]

## 2013-08-24 ENCOUNTER — Other Ambulatory Visit: Payer: Self-pay | Admitting: *Deleted

## 2013-08-24 DIAGNOSIS — M7501 Adhesive capsulitis of right shoulder: Secondary | ICD-10-CM

## 2013-08-24 NOTE — Telephone Encounter (Signed)
Addendum... Patient stopped by the office, and was saying that she did not say she didn't want to got to therapy. She was not satisfied with hand and rehab and is willing to go to [facility] for therapy. I advised patient I would send the order over to them.

## 2013-08-24 NOTE — Telephone Encounter (Signed)
Returned call to [name], PT with hand and rehab, she wanted to make Dr. Aline Brochure aware the patient is only wanting to come to therapy for one visit, due to cost. She said She didn't feel that ordering the Iontophresis for one visit would be beneficial.She is asking if she can cancel the order for that? She is also concerned that the patient's shoulder is going to remain stiff, however she is aware the patient has that right.I did try to call patient, but I had to leave a message. Please advise on cancelling order.Thanks.

## 2013-08-31 ENCOUNTER — Ambulatory Visit (HOSPITAL_COMMUNITY)
Admission: RE | Admit: 2013-08-31 | Discharge: 2013-08-31 | Disposition: A | Discharge status: Home / Self Care | Payer: Medicare Other | Source: Ambulatory Visit | Attending: Orthopedic Surgery | Admitting: Orthopedic Surgery

## 2013-08-31 DIAGNOSIS — IMO0001 Reserved for inherently not codable concepts without codable children: Secondary | ICD-10-CM | POA: Insufficient documentation

## 2013-08-31 DIAGNOSIS — M25519 Pain in unspecified shoulder: Secondary | ICD-10-CM | POA: Insufficient documentation

## 2013-08-31 DIAGNOSIS — M7500 Adhesive capsulitis of unspecified shoulder: Secondary | ICD-10-CM | POA: Insufficient documentation

## 2013-08-31 DIAGNOSIS — M25619 Stiffness of unspecified shoulder, not elsewhere classified: Secondary | ICD-10-CM | POA: Insufficient documentation

## 2013-08-31 DIAGNOSIS — M7501 Adhesive capsulitis of right shoulder: Secondary | ICD-10-CM | POA: Insufficient documentation

## 2013-09-02 ENCOUNTER — Ambulatory Visit (HOSPITAL_COMMUNITY)
Admission: RE | Admit: 2013-09-02 | Discharge: 2013-09-02 | Disposition: A | Discharge status: Home / Self Care | Payer: Medicare Other | Source: Ambulatory Visit | Attending: Pulmonary Disease | Admitting: Pulmonary Disease

## 2013-09-08 ENCOUNTER — Ambulatory Visit (HOSPITAL_COMMUNITY): Payer: Medicare Other | Admitting: Occupational Therapy

## 2013-09-08 NOTE — Progress Notes (Signed)
Occupational Therapy Treatment Patient Details  Name: [name] MRN: [id number] Date of Birth: [birth year]  Today's Date: 09/02/2013 Time: 0805-0850 OT Time Calculation (min): 45 min Manual 0805-0830 (25') TherExercises 2585-2778 (20')  Visit#: 2 of 8  Re-eval: 09/28/13    Authorization: [employer] [place] Medicare  Authorization Time Period: before 10th visit  Authorization Visit#: 2 of 10  Subjective Symptoms/Limitations Symptoms: S:  I can get my arm behind my back better than i could two days ago! Pain Assessment Currently in Pain?: Yes Pain Score: 4  Pain Location: Shoulder Pain Orientation: Right   Exercise/Treatments Supine Protraction: PROM;AAROM;10 reps Horizontal ABduction: PROM;AAROM;10 reps External Rotation: PROM;AAROM;10 reps (increased facilitation/encouragement from OTR for AAROM) Internal Rotation: PROM;AAROM;10 reps Flexion: PROM;AAROM;10 reps ABduction: PROM;AAROM;10 reps Seated Elevation: AROM;12 reps Extension: AROM;12 reps Row: AROM;12 reps Other Seated Exercises: attempted combination extension/IR movement x 5 reps wtih mod difficulty   ROM / Strengthening / Isometric Strengthening Proximal Shoulder Strengthening, Supine: 1'      Modalities Modalities: Iontophoresis Manual Therapy Manual Therapy: Myofascial release Myofascial Release: MFR and manual stretching to right upper arm, trapezius and scapular region to improve fascial restrictions for improved range/functional use Iontophoresis Type of Iontophoresis: Dexamethasone Location: right anterior shoulder  Dose: 40 mA Time: review precautions/wear/removal  Occupational Therapy Assessment and Plan OT Assessment and Plan Clinical Impression Statement: A:  Patient with increased complaint of tightness, min increase in pain and reluctant to perform ER movement this date.  demos/reports increased ability to reach behind her with decreased pain this date.  No report of difficulties from  initial ionto treatment.  progressed HEP this date to include exension and combination extension/IR movments with verbalization of good understandning and return demo.  OT Plan: P:  Iontophoresis; pending progress/pain increase seated exercises, wall washing   Goals Short Term Goals Short Term Goal 1: Patient will be educated on HEP Short Term Goal 1 Progress: Progressing toward goal Short Term Goal 2: Patient will increase RUE strength by at least 1/2 muscle grade to improve functional use Short Term Goal 2 Progress: Progressing toward goal Short Term Goal 3: Patient will decrease complaint of pain at rest to </= 3/10 to improve ability to relax/sleep for improved quality of life Short Term Goal 3 Progress: Progressing toward goal Short Term Goal 4: Patient will increase RUE flexion and abduction by at least 10 degrees to increase functional reach for daily activities Short Term Goal 4 Progress: Progressing toward goal Long Term Goals Long Term Goal 1: Patient will return to maxium independent level as prior with RUE for daily tasks/activities (ie. reaching in closet, upper kitchen cabinets, overhead) Long Term Goal 1 Progress: Progressing toward goal Long Term Goal 2: Patient will demo RUE range of motion to [facility] with complaint of pain </= 3/10 Long Term Goal 2 Progress: Progressing toward goal Long Term Goal 3: Patient will demo 4+/5 muscle strength in RUE to improve independent use in daily activities Long Term Goal 3 Progress: Progressing toward goal Long Term Goal 4: Patient will report ability to complete dressing tasks independently with no complaint of pain Long Term Goal 4 Progress: Progressing toward goal  Problem List Patient Active Problem List   Diagnosis Date Noted  . Adhesive capsulitis of right shoulder 08/31/2013  . Adhesive capsulitis 07/29/2013  . Rotator cuff syndrome of left shoulder 07/29/2013  . Low back pain 12/01/2012  . BUNION 02/23/2008    End of  Session Activity Tolerance: Patient tolerated treatment well General Behavior During  Therapy: [facility] for tasks assessed/performed  GO    Donney Rankins, OTR/L 09/02/2013, 12:45 PM

## 2013-09-08 NOTE — Evaluation (Signed)
Occupational Therapy Evaluation  Patient Details  Name: [name] MRN: [id number] Date of Birth: [birth year]  Today's Date: 08/31/2013 Time: 0815-0850 OT Time Calculation (min): 35 min Evaluation 4627-0350 (64') TherExercises 0938-1829 (10')  Visit#: 1 of 8  Re-eval: 09/28/13     Authorization: BCBS of  Medicare  Authorization Time Period: before 10th visit  Authorization Visit#: 1 of 10   Past Medical History:  Past Medical History  Diagnosis Date  . Fibromyalgia   . Asthma    Past Surgical History:  Past Surgical History  Procedure Laterality Date  . Appendectomy    . Tonsillectomy    . Bunionectomy    . Tubal ligation    . Colonoscopy  10/08/2011    Procedure: COLONOSCOPY;  Surgeon: Dorothyann Peng, MD;  Location: [facility];  Service: Endoscopy;  Laterality: N/A;  1:45 PM    Subjective Symptoms/Limitations Symptoms: S:  I have been working on this at home and I have broken it lose alot in the last month  Pertinent History: Patient arrives stating she has had difficulty with her right arm tightening on her and limiting her range of motion as well as functional use  of her non-dominant UE.  Patient states she recently had evaluation at another clinic and now arrives with order for eval and treat this date Patient Stated Goals: to regain full use of  RUE  Pain Assessment Currently in Pain?: Yes Pain Score: 5  Pain Location: Shoulder Pain Orientation: Right Pain Type: Acute pain (patient reports this has been ongoing for last 9 months) Multiple Pain Sites: No   Balance Screening Balance Screen Has the patient fallen in the past 6 months: No Has the patient had a decrease in activity level because of a fear of falling? : No Is the patient reluctant to leave their home because of a fear of falling? : No    Assessment     08/31/13 0800  Assessment  Diagnosis Adhesive capsulitis right shoulder   Next MD Visit 09/2013  Prior Therapy patient states  she had an eval at another company at end of December 2014 and again she had therapy earlier in 2014  Balance Screen  Has the patient fallen in the past 6 months No  Has the patient had a decrease in activity level because of a fear of falling?  No  Is the patient reluctant to leave their home because of a fear of falling?  No  Home Living  Family/patient expects to be discharged to: Private residence  Living Arrangements Spouse/significant other  Prior Function  Level of [place] with basic ADLs;Independent with homemaking with ambulation  Driving Yes  Leisure [place]  ADL  ADL Comments reports increased ease with ability to reach overhead and wash hair, etc however states she has pain   Vision - History  Baseline Vision Wears glasses all the time  Cognition  Overall Cognitive Status Within Functional Limits for tasks assessed  Arousal/Alertness Awake/alert  Orientation Level Oriented X4  Sensation  Light Touch Appears Intact  Coordination  Gross Motor Movements are Fluid and Coordinated Yes  Fine Motor Movements are Fluid and Coordinated Yes  RUE AROM (degrees)  RUE Overall AROM Comments assessed in standing   Right Shoulder Extension 49 Degrees  Right Shoulder Flexion 124 Degrees  Right Shoulder ABduction 107 Degrees  Right Shoulder Internal Rotation 65 Degrees  Right Shoulder External Rotation 35 Degrees  RUE Strength  RUE Overall Strength Comments increased complaint of pain  with MMT 7/10  Right Shoulder Extension 3/5  Right Shoulder Flexion 3-/5  Right Shoulder ABduction 3-/5  Right Shoulder Internal Rotation 3/5  Right Shoulder External Rotation 2+/5  LUE Assessment  LUE Assessment WFL  Palpation  Palpation moderate fascial restrictions noted throughout right trapezius, scapular and upper arm region  Written Expression  Dominant Hand Left    Exercise/Treatments    Modalities Modalities: Iontophoresis Iontophoresis Type of Iontophoresis:  Dexamethasone Location: right anterior shoulder  Dose: 40 mA Time:  education this date on purpose, precautions, patch wear, removal, skin care.   Occupational Therapy Assessment and Plan OT Assessment and Plan Clinical Impression Statement: A: Patient presents with increased pain and fascial restrictions,  decreased mobility and strength in her right shoulder due to adhesive capsulitis. Patient will benefit from skilled OT intervention to decrease pain and fascial restrictions and increase pain free mobility in her right shoulder region for return to prior level of independence with all B/IADL and leisure activites.  Pt will benefit from skilled therapeutic intervention in order to improve on the following deficits: Decreased strength;Decreased range of motion;Impaired UE functional use;Decreased activity tolerance;Pain;Increased fascial restricitons Rehab Potential: Good OT Frequency: Min 2X/week OT Treatment/Interventions: Self-care/ADL training;Therapeutic activities;Therapeutic exercise;Manual therapy;Modalities;Patient/family education OT Plan: P: Patient will benefit from skilled OT intervention to decrease pain and fascial restrictions and increase pain free mobility in her right shoulder region for return to prior level of independence with all daily tasks/activities and leisure interests.     Treatment Plan: MFR , iontophoresis and manual stretching to shoulder region, AAROM, AROM exercises, proximal shoulder stabilty exercises.    Goals Home Exercise Program Pt/caregiver will Perform Home Exercise Program: For increased ROM;For increased strengthening PT Goal: Perform Home Exercise Program - Progress: Goal set today Short Term Goals Time to Complete Short Term Goals: 2 weeks Short Term Goal 1: Patient will be educated on HEP Short Term Goal 2: Patient will increase RUE strength by at least 1/2 muscle grade to improve functional use Short Term Goal 3: Patient will decrease complaint  of pain at rest to </= 3/10 to improve ability to relax/sleep for improved quality of life Short Term Goal 4: Patient will increase RUE flexion and abduction by at least 10 degrees to increase functional reach for daily activities Long Term Goals Long Term Goal 1: Patient will return to maxium independent level as prior with RUE for daily tasks/activities (ie. reaching in closet, upper kitchen cabinets, overhead) Long Term Goal 2: Patient will demo RUE range of motion to [facility] with complaint of pain </= 3/10 Long Term Goal 3: Patient will demo 4+/5 muscle strength in RUE to improve independent use in daily activities Long Term Goal 4: Patient will report ability to complete dressing tasks independently with no complaint of pain  Problem List Patient Active Problem List   Diagnosis Date Noted  . Adhesive capsulitis of right shoulder 08/31/2013  . Adhesive capsulitis 07/29/2013  . Rotator cuff syndrome of left shoulder 07/29/2013  . Low back pain 12/01/2012  . BUNION 02/23/2008    End of Session Activity Tolerance: Patient tolerated treatment well General Behavior During Therapy: [facility] for tasks assessed/performed OT Plan of Care OT Home Exercise Plan: educated on table stretches OT Patient Instructions: verbalization and return demo Consulted and Agree with Plan of Care: Patient  GO Functional Assessment Tool Used: FOTO 45/100 Functional Limitation: Carrying, moving and handling objects Carrying, Moving and Handling Objects Current Status [id number]): At least 40 percent but less than 60 percent  impaired, limited or restricted Carrying, Moving and Handling Objects Goal Status [phone]): At least 1 percent but less than 20 percent impaired, limited or restricted  Donney Rankins, OTR/L 08/31/2013, 12:14 PM  Physician Documentation Your signature is required to indicate approval of the treatment plan as stated above.  Please sign and either send electronically or make a copy of this report for  your files and return this physician signed original.  Please mark one 1.__approve of plan  2. ___approve of plan with the following conditions.   ______________________________                                                          _____________________ Physician Signature                                                                                                             Date

## 2013-09-09 ENCOUNTER — Ambulatory Visit (HOSPITAL_COMMUNITY)
Admission: RE | Admit: 2013-09-09 | Discharge: 2013-09-09 | Disposition: A | Discharge status: Home / Self Care | Payer: Medicare Other | Source: Ambulatory Visit | Attending: Orthopedic Surgery | Admitting: Orthopedic Surgery

## 2013-09-10 ENCOUNTER — Ambulatory Visit (HOSPITAL_COMMUNITY)
Admission: RE | Admit: 2013-09-10 | Discharge: 2013-09-10 | Disposition: A | Discharge status: Home / Self Care | Payer: Medicare Other | Source: Ambulatory Visit | Attending: Orthopedic Surgery | Admitting: Orthopedic Surgery

## 2013-09-10 NOTE — Progress Notes (Signed)
Occupational Therapy Treatment Patient Details  Name: [name] MRN: [id number] Date of Birth: Jan 27, 1956  Today's Date: 09/09/2013 Time: 0258-5277 OT Time Calculation (min): 44 min Manual 1436-1451 (15') E-stim 1451-1506 (15') Self Cares 1506-1520 (14')  Visit#: 3 of 8  Re-eval: 09/28/13    Authorization: BCBS of [place] Medicare  Authorization Time Period: before 10th visit  Authorization Visit#: 3 of    Subjective Symptoms/Limitations Symptoms: S:  I do so much.... I need to get myself healed before I can do for everybody else.... Pain Assessment Currently in Pain?: Yes Pain Score: 7  Pain Location: Shoulder Pain Orientation: Right Pain Type: Acute pain      Exercise/Treatments    Modalities Modalities: Electrical Stimulation;Iontophoresis Manual Therapy Manual Therapy: Myofascial release Myofascial Release: MFR and manual stretching to right upper arm, trapezius and scapular region to improve fascial restrictions for improved range/functional use Electrical Stimulation Electrical Stimulation Location: right shoulder  Electrical Stimulation Action: interfrential Electrical Stimulation Parameters: sweeping 80/150, 14.0 intensity Electrical Stimulation Goals: Pain Iontophoresis Type of Iontophoresis: Dexamethasone Location: right anterior shoulder / bicep tendon, muscle area Dose: 40 mA Time: review precautions/wear/removal Activities of Daily Living Activities of Daily Living: TENS unit issued this date.  educated patient on unit, placement of pads, precautions, setup.  verbalized good understanding.    Occupational Therapy Assessment and Plan OT Assessment and Plan Clinical Impression Statement: A:  pataient arrives this date with increased complaint of pain in bicep area. reports she has been too tender to do very much of her exercises this date.  tolerated MFR and gentle manual stretching followed by interfrential E-stim to assist in pain/spasm management  with no signs/symptoms of difficulties following.  Issued TENS unit this date.  went over unit, setup, precautions and home use with verbalization of good understanding.   Patient with decreased complaint of pain 2/10 following treatment this date.   OT Plan: P:  follow up on TENS unit - if patient has questions.  Iontophoresis; pending progress/pain  seated exercises, wall washing   Goals Short Term Goals Short Term Goal 1: Patient will be educated on HEP Short Term Goal 1 Progress: Progressing toward goal Short Term Goal 2: Patient will increase RUE strength by at least 1/2 muscle grade to improve functional use Short Term Goal 2 Progress: Progressing toward goal Short Term Goal 3: Patient will decrease complaint of pain at rest to </= 3/10 to improve ability to relax/sleep for improved quality of life Short Term Goal 3 Progress: Progressing toward goal Short Term Goal 4: Patient will increase RUE flexion and abduction by at least 10 degrees to increase functional reach for daily activities Short Term Goal 4 Progress: Progressing toward goal Long Term Goals Long Term Goal 1: Patient will return to maxium independent level as prior with RUE for daily tasks/activities (ie. reaching in closet, upper kitchen cabinets, overhead) Long Term Goal 1 Progress: Progressing toward goal Long Term Goal 2: Patient will demo RUE range of motion to [facility] with complaint of pain </= 3/10 Long Term Goal 2 Progress: Progressing toward goal Long Term Goal 3: Patient will demo 4+/5 muscle strength in RUE to improve independent use in daily activities Long Term Goal 3 Progress: Progressing toward goal Long Term Goal 4: Patient will report ability to complete dressing tasks independently with no complaint of pain Long Term Goal 4 Progress: Progressing toward goal  Problem List Patient Active Problem List   Diagnosis Date Noted  . Adhesive capsulitis of right shoulder 08/31/2013  .  Adhesive capsulitis 07/29/2013   . Rotator cuff syndrome of left shoulder 07/29/2013  . Low back pain 12/01/2012  . BUNION 02/23/2008    End of Session Activity Tolerance: Patient tolerated treatment well General Behavior During Therapy: [facility] for tasks assessed/performed  GO    Donney Rankins, OTR/L  09/09/2013, 4:59 PM

## 2013-09-11 NOTE — Progress Notes (Signed)
Occupational Therapy Treatment Patient Details  Name: [name] MRN: [id number] Date of Birth: Dec 22, 1955  Today's Date: 09/10/2013 Time: 0928-1010 OT Time Calculation (min): 42 min Manual 3818-2993 *27') TherExercises 0955-1010 (15')   Visit#: 4 of 8  Re-eval: 09/28/13    Authorization: BCBS of [place] Medicare  Authorization Time Period: before 10th visit  Authorization Visit#: 4 of 10  Subjective Symptoms/Limitations Symptoms: S:  It is back up in the joint today.... it is an aggrivating ache all the time . Pain Assessment Currently in Pain?: Yes Pain Score: 6  Pain Location: Shoulder Pain Orientation: Right Pain Type: Acute pain     Exercise/Treatments  09/10/13 1000  Shoulder Exercises: Supine  Protraction PROM;AAROM;10 reps  Horizontal ABduction PROM;AAROM;10 reps  External Rotation PROM;AAROM;10 reps (increased facilitation/encouragement from OTR)  Internal Rotation PROM;AAROM;10 reps  Flexion PROM;AAROM;10 reps  ABduction PROM;AAROM;10 reps  Other Supine Exercises ellbow flexion, extension, supination, pronation   Other Supine Exercises ER with arm ABducted to ~90 degrees x 5 reps with decreased discomfort and noted improved movement.      09/10/13 1000  Neck Exercises: Supine  Neck Retraction 5 reps  Lateral Flexion Left;5 reps  Upper Extremity D1 5 reps  Upper Extremity D2 5 reps         Modalities Modalities: Iontophoresis Manual Therapy Manual Therapy: Myofascial release Myofascial Release: MFR and manual stretching to right upper arm, trapezius and scapular region, gentle cervical distraction and MFR to improve fascial restrictions for improved range/functional  Iontophoresis Type of Iontophoresis: Dexamethasone Location: right anterior shoulder Dose: 40 mA Time: review precautions/wear/removal Activities of Daily Living Activities of Daily Living: patient states she was contacted by [facility] rep last night for TENS unit.  Patient reports  no questions regarding unit at this time.  instructed to use as needed at home.   Occupational Therapy Assessment and Plan OT Assessment and Plan Clinical Impression Statement: A: pataient arrives this date with increased complaint of pain in bicep area. reports she has been too tender to do very much of her exercises this date. tolerated MFR and gentle manual stretching followed by interfrential E-stim to assist in pain/spasm management with no signs/symptoms of difficulties following. Issued TENS unit this date. went over unit, setup, precautions and home use with verbalization of good understanding. Patient with decreased complaint of pain 2/10 following treatment this date.  OT Plan: P:  follow up on TENS unit - if patient has questions.  Iontophoresis; pending progress/pain  seated exercises, wall washing   Goals Short Term Goals Short Term Goal 1: Patient will be educated on HEP Short Term Goal 1 Progress: Progressing toward goal Short Term Goal 2: Patient will increase RUE strength by at least 1/2 muscle grade to improve functional use Short Term Goal 2 Progress: Progressing toward goal Short Term Goal 3 Progress: Progressing toward goal Short Term Goal 4: Patient will increase RUE flexion and abduction by at least 10 degrees to increase functional reach for daily activities Short Term Goal 4 Progress: Progressing toward goal Long Term Goals Long Term Goal 1: Patient will return to maxium independent level as prior with RUE for daily tasks/activities (ie. reaching in closet, upper kitchen cabinets, overhead) Long Term Goal 1 Progress: Progressing toward goal Long Term Goal 2: Patient will demo RUE range of motion to [facility] with complaint of pain </= 3/10 Long Term Goal 2 Progress: Progressing toward goal Long Term Goal 3: Patient will demo 4+/5 muscle strength in RUE to improve independent use in  daily activities Long Term Goal 3 Progress: Progressing toward goal Long Term Goal 4: Patient  will report ability to complete dressing tasks independently with no complaint of pain Long Term Goal 4 Progress: Progressing toward goal  Problem List Patient Active Problem List   Diagnosis Date Noted  . Adhesive capsulitis of right shoulder 08/31/2013  . Adhesive capsulitis 07/29/2013  . Rotator cuff syndrome of left shoulder 07/29/2013  . Low back pain 12/01/2012  . BUNION 02/23/2008    End of Session Activity Tolerance: Patient tolerated treatment well General Behavior During Therapy: [facility] for tasks assessed/performed  GO    Donney Rankins, OTR/L  09/10/2013, 11:11 AM

## 2013-09-15 ENCOUNTER — Ambulatory Visit (HOSPITAL_COMMUNITY)
Admission: RE | Admit: 2013-09-15 | Discharge: 2013-09-15 | Disposition: A | Discharge status: Home / Self Care | Payer: Medicare Other | Source: Ambulatory Visit | Attending: Orthopedic Surgery | Admitting: Orthopedic Surgery

## 2013-09-15 NOTE — Progress Notes (Signed)
Occupational Therapy Treatment Patient Details  Name: [name] MRN: [id number] Date of Birth: 1955-11-05  Today's Date: 09/15/2013 Time: 7902-4097 OT Time Calculation (min): 45 min Manual 3532-9924 (15') Therexercises 2683-4196 (30')  Visit#: 5 of 8  Re-eval: 09/28/13    Authorization: BCBS of [place] Medicare  Authorization Time Period: before 10th visit  Authorization Visit#: 5 of 10  Subjective Symptoms/Limitations Symptoms: S:  I have been using my tens unit and it seems to be helping me.  Pain Assessment Currently in Pain?: Yes Pain Score: 1  Pain Location: Shoulder (middle of bicep area) Pain Orientation: Right Pain Type: Acute pain Multiple Pain Sites: No      Exercise/Treatments Supine Protraction: AROM;12 reps;Weights Protraction Weight (lbs): 2 Horizontal ABduction: AROM;12 reps;Weights Horizontal ABduction Weight (lbs): 2 External Rotation: AROM;12 reps;Weights (to ~10 degrees) External Rotation Weight (lbs): 2 Internal Rotation: AROM;12 reps;Weights Internal Rotation Weight (lbs): 2 Flexion: AROM;12 reps;Weights Shoulder Flexion Weight (lbs): 2 ABduction: AROM;12 reps;Weights Shoulder ABduction Weight (lbs): 2 Other Supine Exercises: D2 diagonals with 2# hand weight x 12 reps  Standing External Rotation: AROM;12 reps;Theraband Theraband Level (Shoulder External Rotation): Level 2 (Red) Flexion: AROM;12 reps;Theraband Theraband Level (Shoulder Flexion): Level 2 (Red) Extension: AROM;12 reps;Theraband;Weights (combination movement extension/IR with dowel rod ) Theraband Level (Shoulder Extension): Level 2 (Red) Extension Weight (lbs): 2# dowel rod  Row: AROM;12 reps;Theraband Theraband Level (Shoulder Row): Level 2 (Red) Other Standing Exercises: scaption wiht 2# hand weights to 90degrees x 12 reps   Modalities Modalities: Iontophoresis Manual Therapy Manual Therapy: Myofascial release Myofascial Release: MFR and manual stretching to right  upper arm, trapezius and scapular region, gentle cervical distraction and MFR to improve fascial restrictions for improved range/functional Iontophoresis Type of Iontophoresis: Dexamethasone Location: right anterior shoulder Dose: 40 mA Time: review precautions/wear/removal  Occupational Therapy Assessment and Plan OT Assessment and Plan Clinical Impression Statement: A:  Patient arrives with significantly decreased pain this date.  Excellent tolerance of MFR and exercises this date.  She reports good use/follow through with TENS unit at home with great success.  Patient tolerated strengtheing exercises this date wtih good return demo and verbalization of understanding.  issued red theraband for home.  OT Plan: P:  follow up with theraband exercises. issue/progress band exercises for HEP.  wall washing    Goals Short Term Goals Short Term Goal 1: Patient will be educated on HEP Short Term Goal 1 Progress: Progressing toward goal Short Term Goal 2: Patient will increase RUE strength by at least 1/2 muscle grade to improve functional use Short Term Goal 2 Progress: Progressing toward goal Short Term Goal 3: Patient will decrease complaint of pain at rest to </= 3/10 to improve ability to relax/sleep for improved quality of life Short Term Goal 3 Progress: Progressing toward goal Short Term Goal 4: Patient will increase RUE flexion and abduction by at least 10 degrees to increase functional reach for daily activities Short Term Goal 4 Progress: Progressing toward goal Long Term Goals Long Term Goal 1: Patient will return to maxium independent level as prior with RUE for daily tasks/activities (ie. reaching in closet, upper kitchen cabinets, overhead) Long Term Goal 1 Progress: Progressing toward goal Long Term Goal 2: Patient will demo RUE range of motion to [facility] with complaint of pain </= 3/10 Long Term Goal 2 Progress: Progressing toward goal Long Term Goal 3: Patient will demo 4+/5 muscle  strength in RUE to improve independent use in daily activities Long Term Goal 3 Progress: Progressing toward  goal Long Term Goal 4: Patient will report ability to complete dressing tasks independently with no complaint of pain Long Term Goal 4 Progress: Progressing toward goal  Problem List Patient Active Problem List   Diagnosis Date Noted  . Adhesive capsulitis of right shoulder 08/31/2013  . Adhesive capsulitis 07/29/2013  . Rotator cuff syndrome of left shoulder 07/29/2013  . Low back pain 12/01/2012  . BUNION 02/23/2008    End of Session Activity Tolerance: Patient tolerated treatment well General Behavior During Therapy: [facility] for tasks assessed/performed  GO    Donney Rankins, OTR/L  09/15/2013, 10:16 AM

## 2013-09-17 ENCOUNTER — Ambulatory Visit (HOSPITAL_COMMUNITY)
Admission: RE | Admit: 2013-09-17 | Discharge: 2013-09-17 | Disposition: A | Discharge status: Home / Self Care | Payer: Medicare Other | Source: Ambulatory Visit | Attending: Pulmonary Disease | Admitting: Pulmonary Disease

## 2013-09-17 NOTE — Progress Notes (Signed)
Occupational Therapy Treatment Patient Details  Name: [name] MRN: [id number] Date of Birth: [birth year]  Today's Date: 09/17/2013 Time: 0850-0930 OT Time Calculation (min): 40 min TherExercises 7829-5621 (76')  Visit#: 6 of    Re-eval: 09/28/13    Authorization: [employer] of [place] Medicare  Authorization Time Period: before 10th visit  Authorization Visit#: 6 of 10  Subjective Symptoms/Limitations Symptoms: S:  Really no pain today... I am just weak Pain Assessment Currently in Pain?: No/denies   Exercise/Treatments  09/17/13 0800  Shoulder Exercises: Seated  Elevation Strengthening;12 reps;Weights  Elevation Weight (lbs) 3  Extension Strengthening;12 reps;Weights  Extension Weight (lbs) 3  Retraction Strengthening;12 reps;Weights  Retraction Weight (lbs) 3  Row Strengthening;12 reps;Weights  Row Weight (lbs) 3  Horizontal ABduction Strengthening;12 reps;Weights  Horizontal ABduction Weight (lbs) 3  External Rotation Strengthening;12 reps;Weights  External Rotation Weight (lbs) 3  Internal Rotation Strengthening;12 reps;Weights  Internal Rotation Weight (lbs) 3  Flexion Strengthening;12 reps;Weights  Flexion Weight (lbs) 3  Abduction Strengthening;12 reps;Weights;Limitations  ABduction Weight (lbs) 3  ABduction Limitations to ~45 degrees  Other Seated Exercises scaption x12 reps with 3# hand weights  Shoulder Exercises: Standing  External Rotation AROM;12 reps;Theraband  Theraband Level (Shoulder External Rotation) Level 3 (Green)  Flexion AROM;12 reps;Theraband  Theraband Level (Shoulder Flexion) Level 3 (Green)  ABduction AROM;12 reps;Theraband  Theraband Level (Shoulder ABduction) Level 3 (Green)  Extension AROM;12 reps;Theraband;Weights  Theraband Level (Shoulder Extension) Level 3 (Green)  Row AROM;12 reps;Theraband  Theraband Level (Shoulder Row) Level 3 (Green)  Other Standing Exercises scaption with green theraband x 12 reps         Modalities Modalities: Iontophoresis Iontophoresis Type of Iontophoresis: Dexamethasone Location: right anterior shoulder Dose: 40 mA  Occupational Therapy Assessment and Plan OT Assessment and Plan Clinical Impression Statement: A:  Patient arrives this date with no complaint of pain.  Reports good use of TENS unit at home with great success.  Progressed patient with weighted exercises this date as well as theraband with handout given.  Patient tolerated exercises well, however with noted difficulties with ABduction in both weighted and theraband with no pain.   Applied last ionto patch/treatment this date.  patient has had excellent progress/success with treatments OT Plan: P:  follow up theraband exercises/handouts.  weighted exercises, machines   Goals Short Term Goals Short Term Goal 1: Patient will be educated on HEP Short Term Goal 1 Progress: Progressing toward goal Short Term Goal 2: Patient will increase RUE strength by at least 1/2 muscle grade to improve functional use Short Term Goal 2 Progress: Progressing toward goal Short Term Goal 3: Patient will decrease complaint of pain at rest to </= 3/10 to improve ability to relax/sleep for improved quality of life Short Term Goal 3 Progress: Progressing toward goal Short Term Goal 4: Patient will increase RUE flexion and abduction by at least 10 degrees to increase functional reach for daily activities Short Term Goal 4 Progress: Progressing toward goal Long Term Goals Long Term Goal 1: Patient will return to maxium independent level as prior with RUE for daily tasks/activities (ie. reaching in closet, upper kitchen cabinets, overhead) Long Term Goal 1 Progress: Progressing toward goal Long Term Goal 2: Patient will demo RUE range of motion to [facility] with complaint of pain </= 3/10 Long Term Goal 2 Progress: Progressing toward goal Long Term Goal 3: Patient will demo 4+/5 muscle strength in RUE to improve independent use in daily  activities Long Term Goal 3 Progress: Progressing toward goal  Long Term Goal 4: Patient will report ability to complete dressing tasks independently with no complaint of pain Long Term Goal 4 Progress: Progressing toward goal  Problem List Patient Active Problem List   Diagnosis Date Noted  . Adhesive capsulitis of right shoulder 08/31/2013  . Adhesive capsulitis 07/29/2013  . Rotator cuff syndrome of left shoulder 07/29/2013  . Low back pain 12/01/2012  . BUNION 02/23/2008    End of Session Activity Tolerance: Patient tolerated treatment well General Behavior During Therapy: [facility] for tasks assessed/performed  GO    Donney Rankins, OTR/L  09/17/2013, 12:35 PM

## 2013-09-21 ENCOUNTER — Ambulatory Visit (HOSPITAL_COMMUNITY)
Admission: RE | Admit: 2013-09-21 | Discharge: 2013-09-21 | Disposition: A | Discharge status: Home / Self Care | Payer: Medicare Other | Source: Ambulatory Visit | Attending: Pulmonary Disease | Admitting: Pulmonary Disease

## 2013-09-21 DIAGNOSIS — IMO0001 Reserved for inherently not codable concepts without codable children: Secondary | ICD-10-CM | POA: Insufficient documentation

## 2013-09-21 DIAGNOSIS — M7500 Adhesive capsulitis of unspecified shoulder: Secondary | ICD-10-CM | POA: Insufficient documentation

## 2013-09-21 DIAGNOSIS — M25519 Pain in unspecified shoulder: Secondary | ICD-10-CM | POA: Insufficient documentation

## 2013-09-21 DIAGNOSIS — M25619 Stiffness of unspecified shoulder, not elsewhere classified: Secondary | ICD-10-CM | POA: Insufficient documentation

## 2013-09-22 NOTE — Progress Notes (Signed)
Occupational Therapy Treatment Patient Details  Name: [name] MRN: [id number] Date of Birth: 04/02/1956  Today's Date: 09/21/2013 Time: 5284-1324 OT Time Calculation (min): 52 min Manual 4010-2725 (24') Ultrasound 0912-0920 (8') TherExercises 3664-4034 (20')  Visit#: 7 of 8  Re-eval: 09/28/13    Authorization: [employer] of [place] Medicare  Authorization Time Period: before 10th visit  Authorization Visit#: 7 of 10  Subjective Symptoms/Limitations Symptoms: S:  I am going to have to go to Dr Aline Brochure this week.... I just cant wait any longer because it is almost back to where we  Pain Assessment Currently in Pain?: Yes Pain Score: 2  Pain Location: Shoulder Pain Orientation: Right Pain Type: Acute pain;Chronic pain   Exercise/Treatments   09/21/13 0800  Shoulder Exercises: Seated  Elevation Strengthening;12 reps;Weights  Elevation Weight (lbs) 3  Extension Strengthening;12 reps;Weights  Extension Weight (lbs) 3  Retraction Strengthening;12 reps;Weights  Retraction Weight (lbs) 3  Row Strengthening;12 reps;Weights  Row Weight (lbs) 3  Horizontal ABduction Strengthening;12 reps;Weights  Horizontal ABduction Weight (lbs) 3  External Rotation Strengthening;12 reps;Weights  External Rotation Weight (lbs) 3  Internal Rotation Strengthening;12 reps;Weights  Internal Rotation Weight (lbs) 3  Flexion Strengthening;12 reps;Weights  Flexion Weight (lbs) 3  Abduction Strengthening;12 reps;Weights;Limitations  ABduction Weight (lbs) 3  ABduction Limitations to ~45 degrees  Other Seated Exercises scaption x12 reps with 3# hand weights  Shoulder Exercises: Standing  External Rotation AROM;12 reps;Theraband  Theraband Level (Shoulder External Rotation) Level 3 (Green)  Flexion AROM;12 reps;Theraband  Theraband Level (Shoulder Flexion) Level 3 (Green)  ABduction AROM;12 reps;Theraband  Theraband Level (Shoulder ABduction) Level 3 (Green)  Extension AROM;12  reps;Theraband;Weights  Theraband Level (Shoulder Extension) Level 3 (Green)  Row AROM;12 reps;Theraband  Theraband Level (Shoulder Row) Level 3 (Green)  Other Standing Exercises scaption with green theraband x 12 reps         Modalities Modalities: Ultrasound Manual Therapy Manual Therapy: Myofascial release Myofascial Release: MFR and manual stretching to right upper arm, trapezius and scapular region, gentle cervical distraction and MFR to improve fascial restrictions for improved range/functional Ultrasound Ultrasound Location: right shoulder  Ultrasound Parameters: pulsed (50%) x 8 mins at 0.5 Ultrasound Goals: Pain  Occupational Therapy Assessment and Plan OT Assessment and Plan Clinical Impression Statement: A:  Patient arrives with incrased complaint of pain this date.  She reports religiously utilizing her TENS unit at home.  Patient with good tolerance of Ultrasound this date for pain management.  Cont to improve RUE strength and demos good tolerance with exercises this date.  OT Plan: P:  Discharge??  follow up on ultrasound.    Goals Short Term Goals Short Term Goal 1: Patient will be educated on HEP Short Term Goal 1 Progress: Progressing toward goal Short Term Goal 2: Patient will increase RUE strength by at least 1/2 muscle grade to improve functional use Short Term Goal 2 Progress: Progressing toward goal Short Term Goal 3: Patient will decrease complaint of pain at rest to </= 3/10 to improve ability to relax/sleep for improved quality of life Short Term Goal 3 Progress: Progressing toward goal Short Term Goal 4: Patient will increase RUE flexion and abduction by at least 10 degrees to increase functional reach for daily activities Short Term Goal 4 Progress: Progressing toward goal Long Term Goals Long Term Goal 1: Patient will return to maxium independent level as prior with RUE for daily tasks/activities (ie. reaching in closet, upper kitchen cabinets,  overhead) Long Term Goal 1 Progress: Progressing toward goal Long  Term Goal 2: Patient will demo RUE range of motion to [facility] with complaint of pain </= 3/10 Long Term Goal 2 Progress: Progressing toward goal Long Term Goal 3: Patient will demo 4+/5 muscle strength in RUE to improve independent use in daily activities Long Term Goal 3 Progress: Progressing toward goal Long Term Goal 4: Patient will report ability to complete dressing tasks independently with no complaint of pain Long Term Goal 4 Progress: Progressing toward goal  Problem List Patient Active Problem List   Diagnosis Date Noted  . Adhesive capsulitis of right shoulder 08/31/2013  . Adhesive capsulitis 07/29/2013  . Rotator cuff syndrome of left shoulder 07/29/2013  . Low back pain 12/01/2012  . BUNION 02/23/2008    End of Session Activity Tolerance: Patient tolerated treatment well General Behavior During Therapy: [facility] for tasks assessed/performed  GO     Donney Rankins, OTR/L  09/21/2013, 1:41 PM

## 2013-09-23 ENCOUNTER — Ambulatory Visit (HOSPITAL_COMMUNITY)
Admission: RE | Admit: 2013-09-23 | Discharge: 2013-09-23 | Disposition: A | Discharge status: Home / Self Care | Payer: Medicare Other | Source: Ambulatory Visit | Attending: Pulmonary Disease | Admitting: Pulmonary Disease

## 2013-09-23 NOTE — Evaluation (Signed)
Occupational Therapy Discharge   Patient Details  Name: CAPRISHA BRIDGETT MRN: [id number] Date of Birth: Jun 23, 1956  Today's Date: 09/23/2013 Time: 0254-2706 OT Time Calculation (min): 43 min Reassessment 2376-2831 (12') TherExercises 5176-1607 (21') FOTO (no charge)  1007-1017 (10')  Visit#: 8 of    Re-eval:    Assessment Diagnosis: Adhesive capsulitis right shoulder  Next MD Visit: 09/2013  Authorization: [employer] of [place] Medicare  Authorization Time Period: before 10th visit  Authorization Visit#: 8 of 10   Past Medical History:  Past Medical History  Diagnosis Date  . Fibromyalgia   . Asthma    Past Surgical History:  Past Surgical History  Procedure Laterality Date  . Appendectomy    . Tonsillectomy    . Bunionectomy    . Tubal ligation    . Colonoscopy  10/08/2011    Procedure: COLONOSCOPY;  Surgeon: Dorothyann Peng, MD;  [facility];  Service: Endoscopy;  Laterality: N/A;  1:45 PM    Subjective Symptoms/Limitations Symptoms: S:  This should be over with by now but I am so much better.  Pain Assessment Currently in Pain?: Yes Pain Score: 1  Pain Location: Shoulder Pain Orientation: Right Pain Type: Acute pain;Chronic pain     Balance Screening Balance Screen Has the patient fallen in the past 6 months: No  Prior [place] expects to be discharged to:: Private residence Prior Function Level of Independence: Independent with basic ADLs;Independent with homemaking with ambulation  Assessment ADL/Vision/Perception ADL ADL Comments: reports cont difficulty with pulling hair up into ponytail, however she is able to wash independently  Dominant Hand: Left Vision - History Baseline Vision: Wears glasses all the time  Cognition/Observation Cognition Overall Cognitive Status: Within Functional Limits for tasks assessed   Additional Assessments RUE AROM (degrees) RUE Overall AROM Comments: assessed in standing   Right Shoulder Extension: 48 Degrees Right Shoulder Flexion: 168 Degrees Right Shoulder ABduction: 138 Degrees Right Shoulder Internal Rotation: 84 Degrees Right Shoulder External Rotation: 43 Degrees RUE Strength RUE Overall Strength Comments: increased complaint of pain with MMT 7/10 LUE Assessment LUE Assessment: Within Functional Limits Palpation Palpation: trace fascial restrictions noted throughout right trapezius, scapular and upper arm region     Exercise/Treatments Standing Horizontal ABduction: AROM;15 reps;Theraband Theraband Level (Shoulder Horizontal ABduction): Level 2 (Red) External Rotation: AROM;15 reps;Theraband Theraband Level (Shoulder External Rotation): Level 2 (Red) Internal Rotation: AROM;15 reps;Theraband Theraband Level (Shoulder Internal Rotation): Level 2 (Red) Flexion: AROM;15 reps;Theraband Theraband Level (Shoulder Flexion): Level 2 (Red) ABduction: AROM;15 reps;Theraband Theraband Level (Shoulder ABduction): Level 2 (Red) Extension: AROM;15 reps;Theraband Theraband Level (Shoulder Extension): Level 2 (Red) Row: AROM;15 reps;Theraband Theraband Level (Shoulder Row): Level 2 (Red) Other Standing Exercises: scaptionx 15 reps with red theraband.   ROM / Strengthening / Isometric Strengthening UBE (Upper Arm Bike): 2' forward , 2' backward at level 1.0        Occupational Therapy Assessment and Plan OT Assessment and Plan Clinical Impression Statement: A:  Reassessment completed this date.  Patient with improved pain however  reports she has been unable to utilize her TENs unit due to time constraints.  She has met 3/4 STG and 3/4 LTG, partially meeting remaining 2 goals with continued difficulty with sleeping secondary to pain and cont difficulty with overhead, pulling her hair up.  She has excellent follow through with HEP and is independent with use of TENS unit at home.  Patinet reports  she is able to have relief at times and then  the same pain  in her bicep area will 'flare up'.  Patient will  follow up with MD.  Encouraged to continue HEP for strengthening and use of TENS uint for pain management.   OT Plan: P:  discharge from skilled OT services at this time.  Recommend cont HEP for strengthening and use of TENS unit as needed.    Goals Short Term Goals Short Term Goal 1: Patient will be educated on HEP Short Term Goal 1 Progress: Met Short Term Goal 2: Patient will increase RUE strength by at least 1/2 muscle grade to improve functional use Short Term Goal 2 Progress: Met Short Term Goal 3: Patient will decrease complaint of pain at rest to </= 3/10 to improve ability to relax/sleep for improved quality of life (decreased pain however cont with sleeping/resting difficulty) Short Term Goal 3 Progress: Partly met Short Term Goal 4: Patient will increase RUE flexion and abduction by at least 10 degrees to increase functional reach for daily activities Short Term Goal 4 Progress: Met Long Term Goals Long Term Goal 1: Patient will return to maxium independent level as prior with RUE for daily tasks/activities (ie. reaching in closet, upper kitchen cabinets, overhead) (difficulty with flexion/ER combination  movemennt.) Long Term Goal 1 Progress: Met Long Term Goal 2: Patient will demo RUE range of motion to [facility] with complaint of pain </= 3/10 Long Term Goal 2 Progress: Met Long Term Goal 3: Patient will demo 4+/5 muscle strength in RUE to improve independent use in daily activities (cont difficulty with pulling her hair up ) Long Term Goal 3 Progress: Partly met Long Term Goal 4: Patient will report ability to complete dressing tasks independently with no complaint of pain Long Term Goal 4 Progress: Met  Problem List Patient Active Problem List   Diagnosis Date Noted  . Adhesive capsulitis of right shoulder 08/31/2013  . Adhesive capsulitis 07/29/2013  . Rotator cuff syndrome of left shoulder 07/29/2013  . Low back pain  12/01/2012  . BUNION 02/23/2008    End of Session Activity Tolerance: Patient tolerated treatment well General Behavior During Therapy: WFL for tasks assessed/performed  GO Functional Assessment Tool Used: FOTO 55/100 (initial 45/100) Functional Limitation: Carrying, moving and handling objects Carrying, Moving and Handling Objects Goal Status ([id number]): At least 1 percent but less than 20 percent impaired, limited or restricted Carrying, Moving and Handling Objects Discharge Status [id number]): At least 20 percent but less than 40 percent impaired, limited or restricted (patient score has her at 45% impaired however patient reports she feels much more independent  being only limiited ~20%)  Donney Rankins, OTR/L  09/23/2013, 12:32 PM  Physician Documentation Your signature is required to indicate approval of the treatment plan as stated above.  Please sign and either send electronically or make a copy of this report for your files and return this physician signed original.  Please mark one 1.__approve of plan  2. ___approve of plan with the following conditions.   ______________________________                                                          _____________________ Physician Signature  Date  

## 2013-09-29 ENCOUNTER — Ambulatory Visit (INDEPENDENT_AMBULATORY_CARE_PROVIDER_SITE_OTHER): Payer: Medicare Other | Admitting: Orthopedic Surgery

## 2013-09-29 ENCOUNTER — Encounter: Payer: Self-pay | Admitting: Orthopedic Surgery

## 2013-09-29 VITALS — BP 129/60 | Ht 68.0 in | Wt 146.0 lb

## 2013-09-29 DIAGNOSIS — S43429A Sprain of unspecified rotator cuff capsule, initial encounter: Secondary | ICD-10-CM

## 2013-09-29 DIAGNOSIS — M75101 Unspecified rotator cuff tear or rupture of right shoulder, not specified as traumatic: Secondary | ICD-10-CM

## 2013-09-29 DIAGNOSIS — M7500 Adhesive capsulitis of unspecified shoulder: Secondary | ICD-10-CM

## 2013-09-29 NOTE — Progress Notes (Signed)
Patient ID: [name], female   DOB: 1955-12-04, 58 y.o.   MRN: [id number]  Chief Complaint  Patient presents with  . Follow-up    2 month recheck right shoulder s/p therapy    Patient is undergoing treatment for these capsulitis. She also has some fibromyalgia symptoms. She recently lost her son about a year ago. He's having trouble sleeping at night. She takes Ultram Valium and another sleeping aid and so can't sleep she relates pain to the anterior shoulder joint. Her physical therapy sessions of gone well and she has improved her overall range of motion  However, just pain out of proportion to her disease process. I am going to order an MRI scan of her shoulder to check for a significant rotator cuff tear. I'm not looking for partial tears or intrasubstance tears but a full-thickness tear with retraction to try to explain this. She's also had several MRIs of her cervical spine and they show spondylosis but she has not had any treatment. Her pain is so far out of proportion to her physical findings that she is either having neurologic symptoms with nerve pain or a combination of fibromyalgia and emotional distress from the loss of her son are contributing.  Exam shows approximately 40 of external rotation 100 of abduction but only 100 of forward elevation. She has tenderness over the anterior shoulder joint line cervical spine and trapezius muscle. Neurovascular exam is intact rotator cuff seems intact  Continue physical therapy, usually adhesive capsulitis take 6-9 months of physical therapy before surgical manipulation is recommended. In this setting with the emotional distress that she's been under surgery is not a good option.  MRI the shoulder rule out rotator cuff tear  Continue therapy

## 2013-09-29 NOTE — Patient Instructions (Signed)
MRI ordered  Continue therapy

## 2013-10-09 ENCOUNTER — Ambulatory Visit (HOSPITAL_COMMUNITY): Payer: Medicare Other

## 2013-10-12 ENCOUNTER — Ambulatory Visit (HOSPITAL_COMMUNITY)
Admission: RE | Admit: 2013-10-12 | Discharge: 2013-10-12 | Disposition: A | Discharge status: Home / Self Care | Payer: Medicare Other | Source: Ambulatory Visit | Attending: Orthopedic Surgery | Admitting: Orthopedic Surgery

## 2013-10-12 DIAGNOSIS — M75101 Unspecified rotator cuff tear or rupture of right shoulder, not specified as traumatic: Secondary | ICD-10-CM

## 2013-10-12 DIAGNOSIS — R937 Abnormal findings on diagnostic imaging of other parts of musculoskeletal system: Secondary | ICD-10-CM | POA: Insufficient documentation

## 2013-10-12 DIAGNOSIS — M67919 Unspecified disorder of synovium and tendon, unspecified shoulder: Secondary | ICD-10-CM | POA: Insufficient documentation

## 2013-10-12 DIAGNOSIS — M719 Bursopathy, unspecified: Principal | ICD-10-CM | POA: Insufficient documentation

## 2013-10-12 DIAGNOSIS — M25519 Pain in unspecified shoulder: Secondary | ICD-10-CM | POA: Insufficient documentation

## 2013-10-20 ENCOUNTER — Telehealth: Payer: Self-pay | Admitting: Orthopedic Surgery

## 2013-10-20 NOTE — Telephone Encounter (Signed)
Patient called requesting MRI results, from [facility], 11/09/13. Her ph# is [phone].

## 2013-10-21 ENCOUNTER — Telehealth: Payer: Self-pay | Admitting: Orthopedic Surgery

## 2013-10-21 ENCOUNTER — Telehealth (HOSPITAL_COMMUNITY): Payer: Self-pay

## 2013-10-21 NOTE — Telephone Encounter (Signed)
Called  Relayed results   On roxicet for pain  TENS UNIT   NEEDS REHAB BUT VERY COSTLY  WORKING ON [employer]

## 2013-10-22 ENCOUNTER — Ambulatory Visit (HOSPITAL_COMMUNITY): Payer: Medicare Other

## 2014-04-21 ENCOUNTER — Telehealth: Payer: Self-pay | Admitting: *Deleted

## 2014-04-21 DIAGNOSIS — G9332 Myalgic encephalomyelitis/chronic fatigue syndrome: Secondary | ICD-10-CM

## 2014-04-21 DIAGNOSIS — R5382 Chronic fatigue, unspecified: Secondary | ICD-10-CM

## 2014-04-21 NOTE — Telephone Encounter (Signed)
Pt informed messages routed to Dr. Glo Herring.

## 2014-04-22 MED ORDER — PHENTERMINE HCL 37.5 MG PO TABS: ORAL_TABLET | ORAL | Status: DC

## 2014-04-22 NOTE — Telephone Encounter (Signed)
Phentermine refilled

## 2014-05-03 ENCOUNTER — Other Ambulatory Visit: Payer: Self-pay | Admitting: Obstetrics and Gynecology

## 2014-05-03 DIAGNOSIS — G9332 Myalgic encephalomyelitis/chronic fatigue syndrome: Secondary | ICD-10-CM

## 2014-05-03 DIAGNOSIS — R5382 Chronic fatigue, unspecified: Secondary | ICD-10-CM

## 2014-05-03 MED ORDER — PHENTERMINE HCL 37.5 MG PO TABS: ORAL_TABLET | ORAL | Status: DC

## 2014-07-20 ENCOUNTER — Other Ambulatory Visit: Payer: Self-pay | Admitting: Neurology

## 2014-07-20 NOTE — Telephone Encounter (Signed)
Patient has not been seen since Sept 2013. Called patient, got no answer.  Left message.

## 2014-07-21 NOTE — Telephone Encounter (Signed)
I spoke with patient who said the pharmacy should have contacted Dr Luan Pulling instead of our office.  She will call the pharmacy regarding this.

## 2014-09-16 ENCOUNTER — Other Ambulatory Visit: Payer: Self-pay | Admitting: Obstetrics and Gynecology

## 2014-09-16 DIAGNOSIS — Z1231 Encounter for screening mammogram for malignant neoplasm of breast: Secondary | ICD-10-CM

## 2014-09-24 ENCOUNTER — Ambulatory Visit (HOSPITAL_COMMUNITY)
Admission: RE | Admit: 2014-09-24 | Discharge: 2014-09-24 | Disposition: A | Discharge status: Home / Self Care | Payer: PPO | Source: Ambulatory Visit | Attending: Obstetrics and Gynecology | Admitting: Obstetrics and Gynecology

## 2014-09-24 DIAGNOSIS — Z1231 Encounter for screening mammogram for malignant neoplasm of breast: Secondary | ICD-10-CM | POA: Insufficient documentation

## 2014-09-29 ENCOUNTER — Telehealth: Payer: Self-pay | Admitting: *Deleted

## 2014-09-29 NOTE — Telephone Encounter (Signed)
-----   Message from [name], MD sent at 09/28/2014  5:24 PM EST ----- Normal mammogram. Please call patient, for her peace of mind.

## 2014-09-29 NOTE — Telephone Encounter (Signed)
Pt aware of results of mammogram.

## 2014-10-13 ENCOUNTER — Other Ambulatory Visit (HOSPITAL_COMMUNITY): Payer: Self-pay | Admitting: Pulmonary Disease

## 2014-10-13 DIAGNOSIS — R410 Disorientation, unspecified: Secondary | ICD-10-CM

## 2014-10-20 ENCOUNTER — Ambulatory Visit (HOSPITAL_COMMUNITY)
Admission: RE | Admit: 2014-10-20 | Discharge: 2014-10-20 | Disposition: A | Discharge status: Home / Self Care | Payer: PPO | Source: Ambulatory Visit | Attending: Pulmonary Disease | Admitting: Pulmonary Disease

## 2014-10-20 DIAGNOSIS — R410 Disorientation, unspecified: Secondary | ICD-10-CM | POA: Insufficient documentation

## 2014-10-20 DIAGNOSIS — R51 Headache: Secondary | ICD-10-CM | POA: Insufficient documentation

## 2014-11-29 ENCOUNTER — Other Ambulatory Visit (HOSPITAL_COMMUNITY)
Admission: RE | Admit: 2014-11-29 | Discharge: 2014-11-29 | Disposition: A | Discharge status: Home / Self Care | Payer: PPO | Source: Ambulatory Visit | Attending: Obstetrics and Gynecology | Admitting: Obstetrics and Gynecology

## 2014-11-29 ENCOUNTER — Ambulatory Visit (INDEPENDENT_AMBULATORY_CARE_PROVIDER_SITE_OTHER): Payer: PPO | Admitting: Obstetrics and Gynecology

## 2014-11-29 ENCOUNTER — Encounter: Payer: Self-pay | Admitting: Obstetrics and Gynecology

## 2014-11-29 VITALS — BP 150/86 | Ht 68.0 in | Wt 136.0 lb

## 2014-11-29 DIAGNOSIS — R87810 Cervical high risk human papillomavirus (HPV) DNA test positive: Secondary | ICD-10-CM | POA: Insufficient documentation

## 2014-11-29 DIAGNOSIS — G9332 Myalgic encephalomyelitis/chronic fatigue syndrome: Secondary | ICD-10-CM

## 2014-11-29 DIAGNOSIS — Z1151 Encounter for screening for human papillomavirus (HPV): Secondary | ICD-10-CM | POA: Diagnosis present

## 2014-11-29 DIAGNOSIS — N950 Postmenopausal bleeding: Secondary | ICD-10-CM

## 2014-11-29 DIAGNOSIS — Z01419 Encounter for gynecological examination (general) (routine) without abnormal findings: Secondary | ICD-10-CM | POA: Diagnosis not present

## 2014-11-29 DIAGNOSIS — R5382 Chronic fatigue, unspecified: Secondary | ICD-10-CM

## 2014-11-29 DIAGNOSIS — Z124 Encounter for screening for malignant neoplasm of cervix: Secondary | ICD-10-CM

## 2014-11-29 MED ORDER — PHENTERMINE HCL 37.5 MG PO TABS: ORAL_TABLET | ORAL | Status: DC

## 2014-11-29 NOTE — Progress Notes (Signed)
Patient ID: [name], female   DOB: September 17, 1955, 59 y.o.   MRN: [id number] Pt here today for annual exam. Pt wants to discuss medication for hemorrhoids.

## 2014-11-29 NOTE — Progress Notes (Signed)
Patient ID: [name], female   DOB: 1956/08/15, 59 y.o.   MRN: [id number]   Assessment:  Annual Gyn Exam  rectocele  Plan:  1. pap smear done, next pap due 3 years 2. return annually or prn 3    Annual mammogram advised Subjective:  [name] is a 59 y.o. female No obstetric history on file. who presents for annual exam. Patient's last menstrual period was 11/23/2014. The patient has complaints today of constant, moderate lower back pain that started several weeks ago. Her LNMP was last week. Pt continues to have regular, normal menstrual cycles.  The following portions of the patient's history were reviewed and updated as appropriate: allergies, current medications, past family history, past medical history, past social history, past surgical history and problem list. Past Medical History  Diagnosis Date  . Fibromyalgia   . Asthma     Past Surgical History  Procedure Laterality Date  . Appendectomy    . Tonsillectomy    . Bunionectomy    . Tubal ligation    . Colonoscopy  10/08/2011    Procedure: COLONOSCOPY;  Surgeon: Dorothyann Peng, MD;  Location[facility];  Service: Endoscopy;  Laterality: N/A;  1:45 PM     Current outpatient prescriptions:  .  albuterol (PROVENTIL HFA;VENTOLIN HFA) 108 (90 BASE) MCG/ACT inhaler, Inhale 2 puffs into the lungs every 6 (six) hours as needed., Disp: , Rfl:  .  budesonide-formoterol (SYMBICORT) 160-4.5 MCG/ACT inhaler, Inhale 2 puffs into the lungs 2 (two) times daily., Disp: , Rfl:  .  lubiprostone (AMITIZA) 24 MCG capsule, Take 24 mcg by mouth 2 (two) times daily with a meal., Disp: , Rfl:  .  NON FORMULARY, Lidoderm patches/ as directed/ uses one every night, Disp: , Rfl:  .  pantoprazole (PROTONIX) 40 MG tablet, Take 40 mg by mouth daily., Disp: , Rfl:  .  temazepam (RESTORIL) 22.5 MG capsule, Take 22.5 mg by mouth at bedtime as needed for sleep., Disp: , Rfl:  .  topiramate (TOPAMAX) 50 MG tablet, Take 50 mg by mouth  3 (three) times daily. Pt said she takes three 50 mg tablets bid, Disp: , Rfl:  .  traMADol (ULTRAM) 50 MG tablet, Take 50 mg by mouth every 6 (six) hours as needed. Pt said she takes two tablets in the AM and two tablets in the PM, Disp: , Rfl:   Review of Systems Constitutional: negative Gastrointestinal: negative Genitourinary: negative  Objective:  BP 150/86 mmHg  Ht 5\' 8"  (1.727 m)  Wt 136 lb (61.689 kg)  BMI 20.68 kg/m2  LMP 11/23/2014   BMI: Body mass index is 20.68 kg/(m^2).  General Appearance: Alert, appropriate appearance for age. No acute distress excess tanning HEENT: Grossly normal Neck / Thyroid:  Cardiovascular: RRR; normal S1, S2, no murmur Lungs: CTA bilaterally Back: No CVAT Breast Exam: No dimpling, nipple retraction or discharge. No masses or nodes., Normal to inspection and No masses or nodes.No dimpling, nipple retraction or discharge. Gastrointestinal: Soft, non-tender, no masses or organomegaly Pelvic Exam: Vulva and vagina appear normal. Bimanual exam reveals normal uterus and adnexa. External genitalia: normal general appearance Vaginal: normal mucosa without prolapse or lesions Cervix: normal appearance Adnexa: normal bimanual exam Uterus: normal single, nontender; uterus mobile and well-supported; anterior anteflexed  Rectovaginal: rectocele noted; small hemorrhoid; hemoccult negative Lymphatic Exam: Non-palpable nodes in neck, clavicular, axillary, or inguinal regions  Skin: no rash or abnormalities Neurologic: Normal gait and speech, no tremor  Psychiatric: Alert and  oriented, appropriate affect.  Urinalysis:Not done  Hemoccult: negative  [name]. MD Pgr [phone] 3:21 PM    This chart was scribed for Lisa Kind, MD by Tula Nakayama, ED Scribe. This patient was seen in room 1 and the patient's care was started at 3:21 PM.   I personally performed the services described in this documentation, which was SCRIBED in my presence.  The recorded information has been reviewed and considered accurate. It has been edited as necessary during review. Lisa Kind, MD

## 2014-11-29 NOTE — Addendum Note (Signed)
Addended by: [name] on: 11/29/2014 03:43 PM   Modules accepted: Orders, Level of Service

## 2014-12-01 LAB — CYTOLOGY - PAP

## 2014-12-06 ENCOUNTER — Other Ambulatory Visit: Payer: Self-pay | Admitting: Obstetrics and Gynecology

## 2014-12-06 DIAGNOSIS — N950 Postmenopausal bleeding: Secondary | ICD-10-CM

## 2014-12-07 ENCOUNTER — Ambulatory Visit (INDEPENDENT_AMBULATORY_CARE_PROVIDER_SITE_OTHER): Payer: PPO

## 2014-12-07 DIAGNOSIS — N950 Postmenopausal bleeding: Secondary | ICD-10-CM | POA: Diagnosis not present

## 2014-12-07 NOTE — Progress Notes (Signed)
PELVIC US T/A &TV, heterogenous anteverted uterus,no uterine masses seen,EEC 3.71mm,2.8 x 2.7 x 2.2 cm rt simple ov cyst, 4.2 x 3.9 x 3.5cm simple lt ov cyst,no free fluid seen

## 2015-02-14 ENCOUNTER — Telehealth: Payer: Self-pay | Admitting: *Deleted

## 2015-02-15 NOTE — Telephone Encounter (Signed)
Pt informed of abnormal pap results (LSIL and HPV +)from 11/29/2014. [place] abnormal results. Pt informed would need to have an appt scheduled for Colposcopy and also per Dr. Johnnye Sima note would need endometrial biopsy. Pt informed would clarify with Dr. Glo Herring exactly what type of appt pt need and would contact pt. Pt verbalized understanding.

## 2015-03-11 ENCOUNTER — Encounter: Payer: Self-pay | Admitting: Obstetrics and Gynecology

## 2015-03-11 ENCOUNTER — Other Ambulatory Visit: Payer: Self-pay | Admitting: Obstetrics and Gynecology

## 2015-03-11 ENCOUNTER — Ambulatory Visit (INDEPENDENT_AMBULATORY_CARE_PROVIDER_SITE_OTHER): Payer: PPO | Admitting: Obstetrics and Gynecology

## 2015-03-11 VITALS — BP 130/90

## 2015-03-11 DIAGNOSIS — N840 Polyp of corpus uteri: Secondary | ICD-10-CM | POA: Diagnosis not present

## 2015-03-11 DIAGNOSIS — N950 Postmenopausal bleeding: Secondary | ICD-10-CM | POA: Diagnosis not present

## 2015-03-11 DIAGNOSIS — R87618 Other abnormal cytological findings on specimens from cervix uteri: Secondary | ICD-10-CM | POA: Diagnosis not present

## 2015-03-11 NOTE — Progress Notes (Signed)
Patient ID: [name], female   DOB: 03-19-1956, 59 y.o.   MRN: [id number]  [name] 59 y.o. No obstetric history on file. here for colposcopy for low-grade squamous intraepithelial neoplasia (LGSIL - encompassing HPV,mild dysplasia,CIN I) pap smear on 11/29/2014.  Discussed role for HPV in cervical dysplasia, need for surveillance. Pt SWEARS SHE IS STILL HAVINGPatient given informed consent, signed copy in the chart, time out was performed.  Placed in lithotomy position. Cervix viewed with speculum and colposcope after application of acetic acid.   Colposcopy adequate? Yes  no visible lesions, no mosaicism, no punctation and no abnormal vasculature; biopsies not obtained. Smooth cervix.  ECC specimen obtained.  Some tissue fragments consistent with endometrial tissue obtained along with streaks of dark blood All specimens were labelled and sent to pathology.  Colposcopy IMPRESSION: no visible dysplasia  Patient was given post procedure instructions. Will follow up pathology and manage accordingly.  Routine preventative health maintenance measures emphasized.  Endometrial Biopsy: Patient given informed consent, signed copy in the chart, time out was performed. Time out taken. The patient was placed in the lithotomy position and the cervix brought into view with sterile speculum.  Portio of cervix cleansed x 2 with betadine swabs.  A tenaculum was placed in the anterior lip of the cervix. The uterus was sounded for depth of 6 cm. SOME OLD DARK BLOOD NOTED AT CERVICAL SO Milex uterine Explora 3 mm was introduced to into the uterus, suction created,  and an endometrial sample was obtained. All equipment was removed and accounted for.   SMALL AMOUNT OF TISSUE OBTIANED, MOSTLY FROM LOWER UT SEGMENT The patient tolerated the procedure well.    Patient given post procedure instructions.  Followup: Results by phone in 1 week    This chart was SCRIBED for [name], MD by  Stephania Fragmin, ED Scribe. This patient was seen in room 2, and the patient's care was started at 11:21 AM.  I personally performed the services described in this documentation, which was SCRIBED in my presence. The recorded information has been reviewed and considered accurate. It has been edited as necessary during review. [name], MD

## 2015-03-11 NOTE — Progress Notes (Signed)
Patient ID: [name], female   DOB: 04/19/1956, 59 y.o.   MRN: [id number] Pt here today for colposcopy and endometrial tissue biopsy. Procedures explained and consent signed. Pt is very anxious about the procedures and very concerned about what is wrong.

## 2015-03-18 ENCOUNTER — Telehealth: Payer: Self-pay | Admitting: *Deleted

## 2015-03-18 DIAGNOSIS — Z780 Asymptomatic menopausal state: Secondary | ICD-10-CM

## 2015-03-18 NOTE — Telephone Encounter (Signed)
Pt states Dr. Glo Herring told her at her appt on 03/11/2015 if she had not heard back from him by today to call our office. Pt requesting results from [facility] and Biopsy from 03/11/2015. Pt informed of no malignancy or dyplasia, but would send message to Dr. Glo Herring and let him discuss in more depth the full results. Pt states Dr. Glo Herring also mention to her about a procedure she  needed to have on her "butt" and would like to discuss with Dr. Glo Herring too.

## 2015-03-21 NOTE — Telephone Encounter (Signed)
Pt aware of negative ECC and benign endometrial biopsy with an unusual finding suggesting necrotic tissues from endometrial curetting. Will check FSH value to confirm postmenopausal status.

## 2015-03-22 NOTE — Addendum Note (Signed)
Addended by: Doyne Keel on: 03/22/2015 09:15 AM   Modules accepted: Orders

## 2015-03-23 LAB — TSH: TSH: 1.8 u[IU]/mL (ref 0.450–4.500)

## 2015-03-23 LAB — FOLLICLE STIMULATING HORMONE: FSH: 22.5 m[IU]/mL

## 2015-04-14 ENCOUNTER — Telehealth: Payer: Self-pay | Admitting: Obstetrics and Gynecology

## 2015-04-14 NOTE — Telephone Encounter (Signed)
Left message, endometrial biopsy is benign, and colpo ECC benign, with Koilocytic atypia. Thyroid fxn normal.

## 2015-07-19 ENCOUNTER — Other Ambulatory Visit: Payer: Self-pay | Admitting: *Deleted

## 2015-07-19 DIAGNOSIS — R5382 Chronic fatigue, unspecified: Secondary | ICD-10-CM

## 2015-07-19 DIAGNOSIS — G9332 Myalgic encephalomyelitis/chronic fatigue syndrome: Secondary | ICD-10-CM

## 2015-07-20 MED ORDER — PHENTERMINE HCL 37.5 MG PO TABS: ORAL_TABLET | ORAL | Status: DC

## 2015-09-19 ENCOUNTER — Other Ambulatory Visit: Payer: Self-pay | Admitting: Obstetrics and Gynecology

## 2015-09-19 DIAGNOSIS — Z1231 Encounter for screening mammogram for malignant neoplasm of breast: Secondary | ICD-10-CM

## 2015-09-30 ENCOUNTER — Other Ambulatory Visit: Payer: Self-pay | Admitting: Obstetrics and Gynecology

## 2015-09-30 ENCOUNTER — Ambulatory Visit (HOSPITAL_COMMUNITY)
Admission: RE | Admit: 2015-09-30 | Discharge: 2015-09-30 | Disposition: A | Discharge status: Home / Self Care | Payer: PPO | Source: Ambulatory Visit | Attending: Obstetrics and Gynecology | Admitting: Obstetrics and Gynecology

## 2015-09-30 ENCOUNTER — Ambulatory Visit (HOSPITAL_COMMUNITY): Payer: No Typology Code available for payment source

## 2015-09-30 DIAGNOSIS — Z1231 Encounter for screening mammogram for malignant neoplasm of breast: Secondary | ICD-10-CM | POA: Insufficient documentation

## 2015-10-11 ENCOUNTER — Telehealth: Payer: Self-pay | Admitting: Obstetrics and Gynecology

## 2015-10-11 DIAGNOSIS — B351 Tinea unguium: Secondary | ICD-10-CM | POA: Diagnosis not present

## 2015-10-11 NOTE — Telephone Encounter (Signed)
Pt informed of normal mammogram.

## 2015-10-21 DIAGNOSIS — J209 Acute bronchitis, unspecified: Secondary | ICD-10-CM | POA: Diagnosis not present

## 2015-10-21 DIAGNOSIS — I120 Hypertensive chronic kidney disease with stage 5 chronic kidney disease or end stage renal disease: Secondary | ICD-10-CM | POA: Diagnosis not present

## 2015-10-21 DIAGNOSIS — J45909 Unspecified asthma, uncomplicated: Secondary | ICD-10-CM | POA: Diagnosis not present

## 2015-10-24 ENCOUNTER — Ambulatory Visit (HOSPITAL_COMMUNITY)
Admission: RE | Admit: 2015-10-24 | Discharge: 2015-10-24 | Disposition: A | Discharge status: Home / Self Care | Payer: PPO | Source: Ambulatory Visit | Attending: Pulmonary Disease | Admitting: Pulmonary Disease

## 2015-10-24 ENCOUNTER — Other Ambulatory Visit (HOSPITAL_COMMUNITY): Payer: Self-pay | Admitting: Pulmonary Disease

## 2015-10-24 DIAGNOSIS — I10 Essential (primary) hypertension: Secondary | ICD-10-CM | POA: Diagnosis not present

## 2015-10-24 DIAGNOSIS — R918 Other nonspecific abnormal finding of lung field: Secondary | ICD-10-CM | POA: Diagnosis not present

## 2015-10-24 DIAGNOSIS — R05 Cough: Secondary | ICD-10-CM | POA: Insufficient documentation

## 2015-10-24 DIAGNOSIS — R059 Cough, unspecified: Secondary | ICD-10-CM

## 2015-10-24 DIAGNOSIS — J441 Chronic obstructive pulmonary disease with (acute) exacerbation: Secondary | ICD-10-CM | POA: Diagnosis not present

## 2015-11-29 DIAGNOSIS — Z0100 Encounter for examination of eyes and vision without abnormal findings: Secondary | ICD-10-CM | POA: Diagnosis not present

## 2015-11-29 DIAGNOSIS — H349 Unspecified retinal vascular occlusion: Secondary | ICD-10-CM | POA: Diagnosis not present

## 2015-12-27 ENCOUNTER — Encounter (HOSPITAL_COMMUNITY): Payer: Self-pay | Admitting: Emergency Medicine

## 2015-12-27 ENCOUNTER — Emergency Department (HOSPITAL_COMMUNITY): Payer: PPO

## 2015-12-27 ENCOUNTER — Emergency Department (HOSPITAL_COMMUNITY)
Admission: EM | Admit: 2015-12-27 | Discharge: 2015-12-27 | Disposition: A | Discharge status: Home / Self Care | Payer: PPO | Attending: Emergency Medicine | Admitting: Emergency Medicine

## 2015-12-27 DIAGNOSIS — F17200 Nicotine dependence, unspecified, uncomplicated: Secondary | ICD-10-CM | POA: Diagnosis not present

## 2015-12-27 DIAGNOSIS — R1031 Right lower quadrant pain: Secondary | ICD-10-CM | POA: Diagnosis not present

## 2015-12-27 DIAGNOSIS — Z791 Long term (current) use of non-steroidal anti-inflammatories (NSAID): Secondary | ICD-10-CM | POA: Diagnosis not present

## 2015-12-27 DIAGNOSIS — K529 Noninfective gastroenteritis and colitis, unspecified: Secondary | ICD-10-CM | POA: Diagnosis not present

## 2015-12-27 DIAGNOSIS — J45909 Unspecified asthma, uncomplicated: Secondary | ICD-10-CM | POA: Diagnosis not present

## 2015-12-27 DIAGNOSIS — Z79899 Other long term (current) drug therapy: Secondary | ICD-10-CM | POA: Insufficient documentation

## 2015-12-27 DIAGNOSIS — R1030 Lower abdominal pain, unspecified: Secondary | ICD-10-CM | POA: Diagnosis not present

## 2015-12-27 LAB — CBC WITH DIFFERENTIAL/PLATELET
BASOS ABS: 0.1 10*3/uL (ref 0.0–0.1)
Basophils Relative: 1 %
EOS PCT: 2 %
Eosinophils Absolute: 0.3 10*3/uL (ref 0.0–0.7)
HEMATOCRIT: 37.6 % (ref 36.0–46.0)
HEMOGLOBIN: 11.7 g/dL — AB (ref 12.0–15.0)
LYMPHS ABS: 1.1 10*3/uL (ref 0.7–4.0)
LYMPHS PCT: 7 %
MCH: 25.8 pg — AB (ref 26.0–34.0)
MCHC: 31.1 g/dL (ref 30.0–36.0)
MCV: 83 fL (ref 78.0–100.0)
Monocytes Absolute: 0.9 10*3/uL (ref 0.1–1.0)
Monocytes Relative: 6 %
NEUTROS ABS: 12.9 10*3/uL — AB (ref 1.7–7.7)
Neutrophils Relative %: 85 %
PLATELETS: 340 10*3/uL (ref 150–400)
RBC: 4.53 MIL/uL (ref 3.87–5.11)
RDW: 17.3 % — ABNORMAL HIGH (ref 11.5–15.5)
WBC: 15.2 10*3/uL — AB (ref 4.0–10.5)

## 2015-12-27 LAB — COMPREHENSIVE METABOLIC PANEL
ALK PHOS: 51 U/L (ref 38–126)
ALT: 11 U/L — AB (ref 14–54)
AST: 17 U/L (ref 15–41)
Albumin: 4.1 g/dL (ref 3.5–5.0)
Anion gap: 8 (ref 5–15)
BILIRUBIN TOTAL: 0.4 mg/dL (ref 0.3–1.2)
BUN: 23 mg/dL — AB (ref 6–20)
CALCIUM: 8.7 mg/dL — AB (ref 8.9–10.3)
CHLORIDE: 107 mmol/L (ref 101–111)
CO2: 22 mmol/L (ref 22–32)
CREATININE: 1.13 mg/dL — AB (ref 0.44–1.00)
GFR, EST NON AFRICAN AMERICAN: 52 mL/min — AB (ref >60)
Glucose, Bld: 141 mg/dL — ABNORMAL HIGH (ref 65–99)
Potassium: 3.6 mmol/L (ref 3.5–5.1)
Sodium: 137 mmol/L (ref 135–145)
Total Protein: 7.1 g/dL (ref 6.5–8.1)

## 2015-12-27 LAB — URINALYSIS, ROUTINE W REFLEX MICROSCOPIC
BILIRUBIN URINE: NEGATIVE
Glucose, UA: NEGATIVE mg/dL
HGB URINE DIPSTICK: NEGATIVE
KETONES UR: NEGATIVE mg/dL
Leukocytes, UA: NEGATIVE
Nitrite: NEGATIVE
PROTEIN: NEGATIVE mg/dL
Specific Gravity, Urine: 1.01 (ref 1.005–1.030)
pH: 7 (ref 5.0–8.0)

## 2015-12-27 LAB — PREGNANCY, URINE: PREG TEST UR: NEGATIVE

## 2015-12-27 MED ORDER — OXYCODONE HCL 5 MG PO TABS: 5.0000 mg | ORAL_TABLET | ORAL | Status: DC | PRN

## 2015-12-27 MED ORDER — PROMETHAZINE HCL 25 MG PO TABS: 25.0000 mg | ORAL_TABLET | Freq: Four times a day (QID) | ORAL | Status: DC | PRN

## 2015-12-27 MED ORDER — MORPHINE SULFATE (PF) 4 MG/ML IV SOLN
4.0000 mg | Freq: Once | INTRAVENOUS | Status: AC
  Administered 2015-12-27: 4 mg via INTRAVENOUS
  Filled 2015-12-27: qty 1

## 2015-12-27 MED ORDER — ONDANSETRON HCL 4 MG/2ML IJ SOLN
4.0000 mg | Freq: Once | INTRAMUSCULAR | Status: AC
  Administered 2015-12-27: 4 mg via INTRAVENOUS
  Filled 2015-12-27: qty 2

## 2015-12-27 MED ORDER — DIATRIZOATE MEGLUMINE & SODIUM 66-10 % PO SOLN
ORAL | Status: AC
  Filled 2015-12-27: qty 30

## 2015-12-27 MED ORDER — CIPROFLOXACIN HCL 500 MG PO TABS: 500.0000 mg | ORAL_TABLET | Freq: Two times a day (BID) | ORAL | Status: DC

## 2015-12-27 MED ORDER — METRONIDAZOLE 500 MG PO TABS: 500.0000 mg | ORAL_TABLET | Freq: Three times a day (TID) | ORAL | Status: DC

## 2015-12-27 MED ORDER — CIPROFLOXACIN IN D5W 400 MG/200ML IV SOLN
400.0000 mg | Freq: Once | INTRAVENOUS | Status: AC
  Administered 2015-12-27: 400 mg via INTRAVENOUS
  Filled 2015-12-27: qty 200

## 2015-12-27 MED ORDER — OXYCODONE-ACETAMINOPHEN 5-325 MG PO TABS: 1.0000 | ORAL_TABLET | ORAL | Status: DC | PRN

## 2015-12-27 MED ORDER — KETOROLAC TROMETHAMINE 30 MG/ML IJ SOLN
30.0000 mg | Freq: Once | INTRAMUSCULAR | Status: AC
  Administered 2015-12-27: 30 mg via INTRAVENOUS
  Filled 2015-12-27: qty 1

## 2015-12-27 MED ORDER — METRONIDAZOLE IN NACL 5-0.79 MG/ML-% IV SOLN
500.0000 mg | Freq: Once | INTRAVENOUS | Status: AC
  Administered 2015-12-27: 500 mg via INTRAVENOUS
  Filled 2015-12-27: qty 100

## 2015-12-27 MED ORDER — SODIUM CHLORIDE 0.9 % IV BOLUS (SEPSIS)
500.0000 mL | Freq: Once | INTRAVENOUS | Status: AC
  Administered 2015-12-27: 500 mL via INTRAVENOUS

## 2015-12-27 MED ORDER — IOPAMIDOL (ISOVUE-300) INJECTION 61%
100.0000 mL | Freq: Once | INTRAVENOUS | Status: AC | PRN
  Administered 2015-12-27: 100 mL via INTRAVENOUS

## 2015-12-27 NOTE — ED Notes (Signed)
EDP at bedside updating patient. 

## 2015-12-27 NOTE — ED Notes (Signed)
Woke up this am and having right groin pain, rates pain 10/10.  Denies any injury.  Took Midol because she thought it was cramps.

## 2015-12-27 NOTE — ED Provider Notes (Addendum)
CSN: [id number]     Arrival date & time 12/27/15  [id number] History  By signing my name below, I, Rayna Sexton, attest that this documentation has been prepared under the direction and in the presence of Nat Christen, MD. Electronically Signed: Rayna Sexton, ED Scribe. 12/27/2015. 8:27 AM.   Chief Complaint  Patient presents with  . Groin Pain   The history is provided by the patient. No language interpreter was used.    HPI Comments: [name] is a 60 y.o. female who presents to the Emergency Department complaining of constant, moderate, RLQ pain x 3 hours. Pt reports a SHx including appendectomy and denies this pain is similar to past occurences. Her pain worsens with palpation. Pt took Midol for pain management without significant relief. She denies a PMHx of kidney stones. Pt denies dysuria, hematuria, fevers, chills or any other associated symptoms at this time.   Past Medical History  Diagnosis Date  . Fibromyalgia   . Asthma   . Abnormal Pap smear of cervix    Past Surgical History  Procedure Laterality Date  . Appendectomy    . Tonsillectomy    . Bunionectomy    . Tubal ligation    . Colonoscopy  10/08/2011    Procedure: COLONOSCOPY;  Surgeon: Dorothyann Peng, MD;  Location: [facility];  Service: Endoscopy;  Laterality: N/A;  1:45 PM   Family History  Problem Relation Age of Onset  . Hypertension Mother   . Aneurysm Father   . Hypertension Father   . Cancer Brother   . COPD Brother    Social History  Substance Use Topics  . Smoking status: Current Some Day Smoker  . Smokeless tobacco: Never Used  . Alcohol Use: No   OB History    No data available     Review of Systems  Constitutional: Negative for fever and chills.  Gastrointestinal: Positive for abdominal pain.  Genitourinary: Negative for dysuria and hematuria.  All other systems reviewed and are negative.   Allergies  Review of patient's allergies indicates no known allergies.  Home  Medications   Prior to Admission medications   Medication Sig Start Date End Date Taking? Authorizing Provider  amLODipine (NORVASC) 10 MG tablet Take 10 mg by mouth daily.  12/17/15  Yes Historical Provider, MD  budesonide-formoterol (SYMBICORT) 160-4.5 MCG/ACT inhaler Inhale 2 puffs into the lungs 2 (two) times daily.   Yes Historical Provider, MD  esomeprazole (NEXIUM) 20 MG capsule Take 20 mg by mouth at bedtime.   Yes Historical Provider, MD  Ibuprofen (MIDOL PO) Take 2 capsules by mouth daily as needed (for pain).   Yes Historical Provider, MD  lubiprostone (AMITIZA) 24 MCG capsule Take 24 mcg by mouth 2 (two) times daily with a meal.   Yes Historical Provider, MD  pantoprazole (PROTONIX) 40 MG tablet Take 40 mg by mouth daily.   Yes Historical Provider, MD  topiramate (TOPAMAX) 50 MG tablet Take 150 mg by mouth 2 (two) times daily. Pt said she takes three 50 mg tablets bid   Yes Historical Provider, MD  traMADol (ULTRAM) 50 MG tablet Take 100 mg by mouth at bedtime. Pt said she takes two tablets in the AM and two tablets in the PM   Yes Historical Provider, MD  ciprofloxacin (CIPRO) 500 MG tablet Take 1 tablet (500 mg total) by mouth 2 (two) times daily. 12/27/15   Nat Christen, MD  metroNIDAZOLE (FLAGYL) 500 MG tablet Take 1 tablet (500 mg total)  by mouth 3 (three) times daily. 12/27/15   Nat Christen, MD  oxyCODONE-acetaminophen (PERCOCET/ROXICET) 5-325 MG tablet Take 1-2 tablets by mouth every 4 (four) hours as needed for severe pain. 12/27/15   Nat Christen, MD  phentermine (ADIPEX-P) 37.5 MG tablet TAKE 1/2 TO ONE EVERY 3 DAYS FOR FATIQUE Patient not taking: Reported on 12/27/2015 07/20/15   [name], MD  promethazine (PHENERGAN) 25 MG tablet Take 1 tablet (25 mg total) by mouth every 6 (six) hours as needed. 12/27/15   Nat Christen, MD   BP 124/82 mmHg  Pulse 65  Temp(Src) 97.5 F (36.4 C) (Oral)  Resp 14  Ht 5\' 7"  (1.702 m)  Wt 135 lb (61.236 kg)  BMI 21.14 kg/m2  SpO2 100%  LMP  12/13/2015    Physical Exam  Constitutional: She is oriented to person, place, and time. She appears well-developed and well-nourished.  HENT:  Head: Normocephalic and atraumatic.  Eyes: Conjunctivae and EOM are normal. Pupils are equal, round, and reactive to light.  Neck: Normal range of motion. Neck supple.  Cardiovascular: Normal rate and regular rhythm.   Pulmonary/Chest: Effort normal and breath sounds normal.  Abdominal: Soft. Bowel sounds are normal. There is tenderness.  TTP 4 cm superior to the central inguinal region  Musculoskeletal: Normal range of motion.  Neurological: She is alert and oriented to person, place, and time.  Skin: Skin is warm and dry.  Psychiatric: She has a normal mood and affect. Her behavior is normal.  Nursing note and vitals reviewed.   ED Course  Procedures  DIAGNOSTIC STUDIES: Oxygen Saturation is 100% on RA, normal by my interpretation.    COORDINATION OF CARE: 8:10 AM Discussed next steps with pt including pain medications and CT abd/pelvis to rule out a kidney stone. She verbalized understanding and is agreeable with the plan.   Labs Review Labs Reviewed  CBC WITH DIFFERENTIAL/PLATELET - Abnormal; Notable for the following:    WBC 15.2 (*)    Hemoglobin 11.7 (*)    MCH 25.8 (*)    RDW 17.3 (*)    Neutro Abs 12.9 (*)    All other components within normal limits  COMPREHENSIVE METABOLIC PANEL - Abnormal; Notable for the following:    Glucose, Bld 141 (*)    BUN 23 (*)    Creatinine, Ser 1.13 (*)    Calcium 8.7 (*)    ALT 11 (*)    GFR calc non Af Amer 52 (*)    All other components within normal limits  URINALYSIS, ROUTINE W REFLEX MICROSCOPIC (NOT AT [facility])  PREGNANCY, URINE    Imaging Review Ct Abdomen Pelvis W Contrast  12/27/2015  CLINICAL DATA:  RIGHT lower pelvic and groin pain that began at 0500 hours, history remote appendectomy at age 4, fibromyalgia, asthma, smoking EXAM: CT ABDOMEN AND PELVIS WITH CONTRAST TECHNIQUE:  Multidetector CT imaging of the abdomen and pelvis was performed using the standard protocol following bolus administration of intravenous contrast. Sagittal and coronal MPR images reconstructed from axial data set. CONTRAST:  172mL ISOVUE-300 IOPAMIDOL (ISOVUE-300) INJECTION 61% IV. Dilute oral contrast. COMPARISON:  02/25/2007 FINDINGS: Lower chest:  Lung bases clear Hepatobiliary: Unremarkable liver and gallbladder. No biliary dilatation. Pancreas: Normal appearance Spleen: Normal appearance Adrenals/Urinary Tract: Normal adrenal glands. Normal appearing kidneys. No urinary tract calcification or dilatation. Unremarkable bladder and ureters. Stomach/Bowel: Appendix surgically absent by history. Few small lymph nodes medial to cecum. Small bowel loops underdistended and un opacified limiting assessment. Questionable mild wall thickening  of terminal ileum versus artifact from underdistention. Minimal stranding at inferior aspect of RIGHT pericolic gutter adjacent to distal ileum. Unremarkable colon, stomach and remaining small bowel loops. Vascular/Lymphatic: Scattered atherosclerotic calcifications. Vascular structures grossly patent. No adenopathy. Reproductive: Suspected septate uterus. Small cysts RIGHT ovary. Unremarkable LEFT adnexa. Other: No mass, free air, free fluid, or hernia. Musculoskeletal: Bone island LEFT ilium.  No acute osseous findings. IMPRESSION: Questionable mild fall wall thickening of the distal ileum versus artifact from underdistention. The presence of minimal stranding at the inferior aspect of the adjacent RIGHT pericolic gutter could support the findings of subtle acute enteritis of the terminal ileum. Remainder of exam unremarkable. Electronically Signed   By: Lavonia Dana M.D.   On: 12/27/2015 08:55   I have personally reviewed and evaluated these images and lab results as part of my medical decision-making.   EKG Interpretation None      MDM   Final diagnoses:  Ileitis    Vital signs are stable. No acute abdomen. CT scan suggests a possible ileitis. Rx IV Cipro, IV Flagyl, pain management.  Charge medications Cipro, Flagyl, Oxycodone, Phenergan 25 mg. Discussed findings with the patient. She has primary care follow-up.  I personally performed the services described in this documentation, which was scribed in my presence. The recorded information has been reviewed and is accurate.     Nat Christen, MD 12/27/15 [place], MD 12/27/15 1309

## 2015-12-27 NOTE — ED Notes (Signed)
Pt notified that a urine specimen is needed. Pt verbalized understanding and will notify staff when one can be obtained.

## 2015-12-27 NOTE — Discharge Instructions (Signed)
We will treat you for an inflammation of your intestine called ileitis. Prescriptions for 2 antibiotics, pain medicine, nausea medicine. Clear liquids today. Return if worse or follow-up your primary care doctor

## 2015-12-27 NOTE — ED Notes (Signed)
Pt c/o increased pain. Dr. Cook notified. 

## 2015-12-29 ENCOUNTER — Other Ambulatory Visit (HOSPITAL_COMMUNITY)
Admission: RE | Admit: 2015-12-29 | Discharge: 2015-12-29 | Disposition: A | Discharge status: Home / Self Care | Payer: PPO | Source: Ambulatory Visit | Attending: Pulmonary Disease | Admitting: Pulmonary Disease

## 2015-12-29 ENCOUNTER — Other Ambulatory Visit (HOSPITAL_COMMUNITY): Payer: Self-pay | Admitting: Pulmonary Disease

## 2015-12-29 ENCOUNTER — Ambulatory Visit (HOSPITAL_COMMUNITY)
Admission: RE | Admit: 2015-12-29 | Discharge: 2015-12-29 | Disposition: A | Discharge status: Home / Self Care | Payer: PPO | Source: Ambulatory Visit | Attending: Pulmonary Disease | Admitting: Pulmonary Disease

## 2015-12-29 DIAGNOSIS — R52 Pain, unspecified: Secondary | ICD-10-CM | POA: Insufficient documentation

## 2015-12-29 DIAGNOSIS — R339 Retention of urine, unspecified: Secondary | ICD-10-CM | POA: Diagnosis not present

## 2015-12-29 DIAGNOSIS — R109 Unspecified abdominal pain: Secondary | ICD-10-CM | POA: Diagnosis not present

## 2015-12-29 DIAGNOSIS — J45909 Unspecified asthma, uncomplicated: Secondary | ICD-10-CM | POA: Diagnosis not present

## 2015-12-29 DIAGNOSIS — I10 Essential (primary) hypertension: Secondary | ICD-10-CM | POA: Diagnosis not present

## 2015-12-29 LAB — BASIC METABOLIC PANEL
ANION GAP: 5 (ref 5–15)
BUN: 15 mg/dL (ref 6–20)
CO2: 25 mmol/L (ref 22–32)
Calcium: 8.9 mg/dL (ref 8.9–10.3)
Chloride: 110 mmol/L (ref 101–111)
Creatinine, Ser: 1.04 mg/dL — ABNORMAL HIGH (ref 0.44–1.00)
GFR, EST NON AFRICAN AMERICAN: 58 mL/min — AB (ref >60)
Glucose, Bld: 97 mg/dL (ref 65–99)
Potassium: 3.7 mmol/L (ref 3.5–5.1)
SODIUM: 140 mmol/L (ref 135–145)

## 2015-12-29 LAB — CBC
HEMATOCRIT: 35.5 % — AB (ref 36.0–46.0)
HEMOGLOBIN: 11.3 g/dL — AB (ref 12.0–15.0)
MCH: 26.7 pg (ref 26.0–34.0)
MCHC: 31.8 g/dL (ref 30.0–36.0)
MCV: 83.7 fL (ref 78.0–100.0)
Platelets: 326 10*3/uL (ref 150–400)
RBC: 4.24 MIL/uL (ref 3.87–5.11)
RDW: 17.8 % — ABNORMAL HIGH (ref 11.5–15.5)
WBC: 5.6 10*3/uL (ref 4.0–10.5)

## 2016-03-05 ENCOUNTER — Other Ambulatory Visit: Payer: Self-pay | Admitting: Obstetrics and Gynecology

## 2016-03-05 DIAGNOSIS — G9332 Myalgic encephalomyelitis/chronic fatigue syndrome: Secondary | ICD-10-CM

## 2016-03-05 DIAGNOSIS — R5382 Chronic fatigue, unspecified: Secondary | ICD-10-CM

## 2016-03-05 MED ORDER — PHENTERMINE HCL 37.5 MG PO TABS: ORAL_TABLET | ORAL | Status: DC

## 2016-03-30 ENCOUNTER — Other Ambulatory Visit (HOSPITAL_COMMUNITY): Payer: Self-pay | Admitting: Pulmonary Disease

## 2016-03-30 ENCOUNTER — Ambulatory Visit (HOSPITAL_COMMUNITY)
Admission: RE | Admit: 2016-03-30 | Discharge: 2016-03-30 | Disposition: A | Discharge status: Home / Self Care | Payer: PPO | Source: Ambulatory Visit | Attending: Pulmonary Disease | Admitting: Pulmonary Disease

## 2016-03-30 ENCOUNTER — Telehealth: Payer: Self-pay | Admitting: *Deleted

## 2016-03-30 DIAGNOSIS — R5382 Chronic fatigue, unspecified: Secondary | ICD-10-CM

## 2016-03-30 DIAGNOSIS — I6930 Unspecified sequelae of cerebral infarction: Secondary | ICD-10-CM | POA: Diagnosis not present

## 2016-03-30 DIAGNOSIS — M25531 Pain in right wrist: Secondary | ICD-10-CM

## 2016-03-30 DIAGNOSIS — J45909 Unspecified asthma, uncomplicated: Secondary | ICD-10-CM | POA: Diagnosis not present

## 2016-03-30 DIAGNOSIS — G9332 Myalgic encephalomyelitis/chronic fatigue syndrome: Secondary | ICD-10-CM

## 2016-03-30 DIAGNOSIS — M25532 Pain in left wrist: Secondary | ICD-10-CM | POA: Insufficient documentation

## 2016-03-30 DIAGNOSIS — I10 Essential (primary) hypertension: Secondary | ICD-10-CM | POA: Diagnosis not present

## 2016-03-30 MED ORDER — PHENTERMINE HCL 37.5 MG PO TABS: ORAL_TABLET | ORAL | 1 refills | Status: DC

## 2016-04-06 DIAGNOSIS — F331 Major depressive disorder, recurrent, moderate: Secondary | ICD-10-CM | POA: Diagnosis not present

## 2016-04-16 DIAGNOSIS — F331 Major depressive disorder, recurrent, moderate: Secondary | ICD-10-CM | POA: Diagnosis not present

## 2016-05-01 ENCOUNTER — Ambulatory Visit (INDEPENDENT_AMBULATORY_CARE_PROVIDER_SITE_OTHER): Payer: PPO | Admitting: Orthopedic Surgery

## 2016-05-01 ENCOUNTER — Encounter: Payer: Self-pay | Admitting: Orthopedic Surgery

## 2016-05-01 VITALS — BP 140/88 | HR 87 | Ht 68.0 in | Wt 136.0 lb

## 2016-05-01 DIAGNOSIS — M25532 Pain in left wrist: Secondary | ICD-10-CM | POA: Diagnosis not present

## 2016-05-01 DIAGNOSIS — M654 Radial styloid tenosynovitis [de Quervain]: Secondary | ICD-10-CM | POA: Diagnosis not present

## 2016-05-01 NOTE — Progress Notes (Signed)
New problem  Consult requested by Dr. Sinda Du  Pharmacy [facility]  Severe pain left hand, severe pain right thumb  Duration 2 months. No history of injury. 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 She rates her pain 9 out of 10. Says it's constant stabbing aching and radiating especially on the right side radiating down the thumb starting in the distal forearm. As far as the left side goes she has dorsal pain radiating across the wrist joint. Or other signs or symptoms include catching stiffness some numbness and tingling she started taking ibuprofen as needed tramadol for her fibromyalgia and she has tried nighttime splinting.  Review of Systems  HENT:       Recent dental work 2 canals  Musculoskeletal: Positive for joint pain.  All other systems reviewed and are negative.  Past Medical History:  Diagnosis Date  . Abnormal Pap smear of cervix   . Asthma   . Fibromyalgia     BP 140/88   Pulse 87   Ht 5\' 8"  (1.727 m)   Wt 136 lb (61.7 kg)   LMP 12/13/2015   BMI 20.68 kg/m   General appearance normal grooming hygiene ectomorphic body habitus Oriented 3 Mood pleasant affect flat Gait and station normal Right hand tenderness over the extensor tendons of the thumb no swelling painful ulnar deviation normal range of motion. Wrist joint is stable. Grip strength is normal. Skin without rash or erythema. Radial pulse normal. Sensation normal.  Left hand and wrist tenderness over the dorsum of the wrist joint carpal bones without swelling or effusion. Full range of motion. Wrist joint stable. Strength normal. Skin normal. Pulses normal in the radial and ulnar artery. Sensation normal.  X-rays were done at the hospital and they are normal. Both x-rays and reports were reviewed and my independent interpretation is at the right hand shows normal wrist alignment including carpal bones in the left wrist and hand show the same.  Recommend thumb spica  splint for de Quervain's disease take I proven 3 times a day continue tramadol as taking.  Ibuprofen for left wrist pain no specific cause  Follow-up 6 weeks

## 2016-05-10 NOTE — Telephone Encounter (Signed)
RX sent.  AS

## 2016-06-12 ENCOUNTER — Ambulatory Visit: Payer: PPO | Admitting: Orthopedic Surgery

## 2016-06-29 DIAGNOSIS — J45909 Unspecified asthma, uncomplicated: Secondary | ICD-10-CM | POA: Diagnosis not present

## 2016-06-29 DIAGNOSIS — M545 Low back pain: Secondary | ICD-10-CM | POA: Diagnosis not present

## 2016-06-29 DIAGNOSIS — G4700 Insomnia, unspecified: Secondary | ICD-10-CM | POA: Diagnosis not present

## 2016-06-29 DIAGNOSIS — I10 Essential (primary) hypertension: Secondary | ICD-10-CM | POA: Diagnosis not present

## 2016-09-07 ENCOUNTER — Other Ambulatory Visit: Payer: Self-pay | Admitting: Obstetrics and Gynecology

## 2016-09-07 DIAGNOSIS — Z1231 Encounter for screening mammogram for malignant neoplasm of breast: Secondary | ICD-10-CM

## 2016-09-21 DIAGNOSIS — I10 Essential (primary) hypertension: Secondary | ICD-10-CM | POA: Diagnosis not present

## 2016-09-21 DIAGNOSIS — G43909 Migraine, unspecified, not intractable, without status migrainosus: Secondary | ICD-10-CM | POA: Diagnosis not present

## 2016-09-21 DIAGNOSIS — G4700 Insomnia, unspecified: Secondary | ICD-10-CM | POA: Diagnosis not present

## 2016-09-21 DIAGNOSIS — J209 Acute bronchitis, unspecified: Secondary | ICD-10-CM | POA: Diagnosis not present

## 2016-10-01 ENCOUNTER — Ambulatory Visit (HOSPITAL_COMMUNITY)
Admission: RE | Admit: 2016-10-01 | Discharge: 2016-10-01 | Disposition: A | Discharge status: Home / Self Care | Payer: PPO | Source: Ambulatory Visit | Attending: Obstetrics and Gynecology | Admitting: Obstetrics and Gynecology

## 2016-10-01 DIAGNOSIS — Z1231 Encounter for screening mammogram for malignant neoplasm of breast: Secondary | ICD-10-CM

## 2016-10-09 ENCOUNTER — Other Ambulatory Visit: Payer: Self-pay | Admitting: Obstetrics and Gynecology

## 2016-10-09 DIAGNOSIS — R5382 Chronic fatigue, unspecified: Secondary | ICD-10-CM

## 2016-10-09 DIAGNOSIS — G9332 Myalgic encephalomyelitis/chronic fatigue syndrome: Secondary | ICD-10-CM

## 2016-10-10 NOTE — Telephone Encounter (Signed)
refil phentermine. Helps fibromyalgia

## 2016-10-11 ENCOUNTER — Other Ambulatory Visit: Payer: Self-pay | Admitting: Obstetrics and Gynecology

## 2016-10-11 DIAGNOSIS — G9332 Myalgic encephalomyelitis/chronic fatigue syndrome: Secondary | ICD-10-CM

## 2016-10-11 DIAGNOSIS — R5382 Chronic fatigue, unspecified: Secondary | ICD-10-CM

## 2016-11-30 DIAGNOSIS — H349 Unspecified retinal vascular occlusion: Secondary | ICD-10-CM | POA: Diagnosis not present

## 2016-11-30 DIAGNOSIS — H524 Presbyopia: Secondary | ICD-10-CM | POA: Diagnosis not present

## 2016-11-30 DIAGNOSIS — H25043 Posterior subcapsular polar age-related cataract, bilateral: Secondary | ICD-10-CM | POA: Diagnosis not present

## 2016-12-02 ENCOUNTER — Inpatient Hospital Stay (HOSPITAL_COMMUNITY)
Admission: EM | Admit: 2016-12-02 | Discharge: 2016-12-09 | DRG: 203 | Disposition: A | Discharge status: Home / Self Care | Payer: PPO | Attending: Pulmonary Disease | Admitting: Pulmonary Disease

## 2016-12-02 ENCOUNTER — Emergency Department (HOSPITAL_COMMUNITY): Payer: PPO

## 2016-12-02 ENCOUNTER — Encounter (HOSPITAL_COMMUNITY): Payer: Self-pay | Admitting: Emergency Medicine

## 2016-12-02 DIAGNOSIS — F411 Generalized anxiety disorder: Secondary | ICD-10-CM | POA: Diagnosis present

## 2016-12-02 DIAGNOSIS — Z7951 Long term (current) use of inhaled steroids: Secondary | ICD-10-CM | POA: Diagnosis not present

## 2016-12-02 DIAGNOSIS — K219 Gastro-esophageal reflux disease without esophagitis: Secondary | ICD-10-CM | POA: Diagnosis not present

## 2016-12-02 DIAGNOSIS — M545 Low back pain, unspecified: Secondary | ICD-10-CM | POA: Diagnosis present

## 2016-12-02 DIAGNOSIS — J4521 Mild intermittent asthma with (acute) exacerbation: Secondary | ICD-10-CM | POA: Diagnosis not present

## 2016-12-02 DIAGNOSIS — R0602 Shortness of breath: Secondary | ICD-10-CM | POA: Diagnosis not present

## 2016-12-02 DIAGNOSIS — M75102 Unspecified rotator cuff tear or rupture of left shoulder, not specified as traumatic: Secondary | ICD-10-CM | POA: Diagnosis present

## 2016-12-02 DIAGNOSIS — J4540 Moderate persistent asthma, uncomplicated: Secondary | ICD-10-CM | POA: Diagnosis present

## 2016-12-02 DIAGNOSIS — Z8673 Personal history of transient ischemic attack (TIA), and cerebral infarction without residual deficits: Secondary | ICD-10-CM

## 2016-12-02 DIAGNOSIS — S39012A Strain of muscle, fascia and tendon of lower back, initial encounter: Secondary | ICD-10-CM

## 2016-12-02 DIAGNOSIS — Z79899 Other long term (current) drug therapy: Secondary | ICD-10-CM | POA: Diagnosis not present

## 2016-12-02 DIAGNOSIS — Z8249 Family history of ischemic heart disease and other diseases of the circulatory system: Secondary | ICD-10-CM

## 2016-12-02 DIAGNOSIS — J45901 Unspecified asthma with (acute) exacerbation: Secondary | ICD-10-CM | POA: Diagnosis not present

## 2016-12-02 DIAGNOSIS — G8929 Other chronic pain: Secondary | ICD-10-CM | POA: Diagnosis not present

## 2016-12-02 DIAGNOSIS — Z87891 Personal history of nicotine dependence: Secondary | ICD-10-CM | POA: Diagnosis not present

## 2016-12-02 DIAGNOSIS — B379 Candidiasis, unspecified: Secondary | ICD-10-CM | POA: Diagnosis not present

## 2016-12-02 DIAGNOSIS — Z825 Family history of asthma and other chronic lower respiratory diseases: Secondary | ICD-10-CM | POA: Diagnosis not present

## 2016-12-02 DIAGNOSIS — M797 Fibromyalgia: Secondary | ICD-10-CM | POA: Diagnosis not present

## 2016-12-02 DIAGNOSIS — R05 Cough: Secondary | ICD-10-CM | POA: Diagnosis not present

## 2016-12-02 DIAGNOSIS — I6930 Unspecified sequelae of cerebral infarction: Secondary | ICD-10-CM

## 2016-12-02 DIAGNOSIS — F329 Major depressive disorder, single episode, unspecified: Secondary | ICD-10-CM | POA: Diagnosis present

## 2016-12-02 DIAGNOSIS — F32A Depression, unspecified: Secondary | ICD-10-CM | POA: Diagnosis present

## 2016-12-02 DIAGNOSIS — J441 Chronic obstructive pulmonary disease with (acute) exacerbation: Secondary | ICD-10-CM | POA: Diagnosis not present

## 2016-12-02 LAB — BASIC METABOLIC PANEL
ANION GAP: 5 (ref 5–15)
BUN: 9 mg/dL (ref 6–20)
CALCIUM: 8.9 mg/dL (ref 8.9–10.3)
CO2: 24 mmol/L (ref 22–32)
Chloride: 114 mmol/L — ABNORMAL HIGH (ref 101–111)
Creatinine, Ser: 0.84 mg/dL (ref 0.44–1.00)
GFR calc Af Amer: >60 mL/min (ref >60)
GFR calc non Af Amer: >60 mL/min (ref >60)
GLUCOSE: 97 mg/dL (ref 65–99)
Potassium: 3.8 mmol/L (ref 3.5–5.1)
Sodium: 143 mmol/L (ref 135–145)

## 2016-12-02 LAB — CBC
HEMATOCRIT: 36.2 % (ref 36.0–46.0)
HEMOGLOBIN: 11.4 g/dL — AB (ref 12.0–15.0)
MCH: 28.1 pg (ref 26.0–34.0)
MCHC: 31.5 g/dL (ref 30.0–36.0)
MCV: 89.2 fL (ref 78.0–100.0)
PLATELETS: 303 10*3/uL (ref 150–400)
RBC: 4.06 MIL/uL (ref 3.87–5.11)
RDW: 15.4 % (ref 11.5–15.5)
WBC: 5.3 10*3/uL (ref 4.0–10.5)

## 2016-12-02 LAB — TROPONIN I: Troponin I: <0.03 ng/mL (ref <0.03)

## 2016-12-02 MED ORDER — HYDROCODONE-ACETAMINOPHEN 5-325 MG PO TABS
1.0000 | ORAL_TABLET | ORAL | Status: AC
  Administered 2016-12-02: 1 via ORAL
  Filled 2016-12-02: qty 1

## 2016-12-02 MED ORDER — IPRATROPIUM BROMIDE 0.02 % IN SOLN: 0.5000 mg | Freq: Four times a day (QID) | RESPIRATORY_TRACT | Status: DC

## 2016-12-02 MED ORDER — AMLODIPINE BESYLATE 5 MG PO TABS
5.0000 mg | ORAL_TABLET | Freq: Every day | ORAL | Status: DC
  Administered 2016-12-03 – 2016-12-09 (×7): 5 mg via ORAL
  Filled 2016-12-02 (×7): qty 1

## 2016-12-02 MED ORDER — SODIUM CHLORIDE 0.9% FLUSH: 3.0000 mL | INTRAVENOUS | Status: DC | PRN

## 2016-12-02 MED ORDER — IPRATROPIUM-ALBUTEROL 0.5-2.5 (3) MG/3ML IN SOLN
3.0000 mL | Freq: Once | RESPIRATORY_TRACT | Status: AC
  Administered 2016-12-02: 3 mL via RESPIRATORY_TRACT
  Filled 2016-12-02: qty 3

## 2016-12-02 MED ORDER — METHYLPREDNISOLONE SODIUM SUCC 125 MG IJ SOLR
125.0000 mg | Freq: Once | INTRAMUSCULAR | Status: AC
  Administered 2016-12-02: 125 mg via INTRAVENOUS
  Filled 2016-12-02: qty 2

## 2016-12-02 MED ORDER — ALBUTEROL (5 MG/ML) CONTINUOUS INHALATION SOLN
10.0000 mg/h | INHALATION_SOLUTION | Freq: Once | RESPIRATORY_TRACT | Status: AC
  Administered 2016-12-02: 10 mg/h via RESPIRATORY_TRACT
  Filled 2016-12-02: qty 20

## 2016-12-02 MED ORDER — TEMAZEPAM 15 MG PO CAPS
30.0000 mg | ORAL_CAPSULE | Freq: Every day | ORAL | Status: DC
  Administered 2016-12-03: 30 mg via ORAL
  Filled 2016-12-02: qty 2

## 2016-12-02 MED ORDER — SODIUM CHLORIDE 0.9% FLUSH
3.0000 mL | Freq: Two times a day (BID) | INTRAVENOUS | Status: DC
  Administered 2016-12-03: 3 mL via INTRAVENOUS
  Administered 2016-12-03: 14:00:00 via INTRAVENOUS
  Administered 2016-12-03 – 2016-12-08 (×9): 3 mL via INTRAVENOUS

## 2016-12-02 MED ORDER — IBUPROFEN 400 MG PO TABS
400.0000 mg | ORAL_TABLET | Freq: Once | ORAL | Status: AC
  Administered 2016-12-02: 400 mg via ORAL
  Filled 2016-12-02: qty 1

## 2016-12-02 MED ORDER — LUBIPROSTONE 24 MCG PO CAPS
24.0000 ug | ORAL_CAPSULE | Freq: Two times a day (BID) | ORAL | Status: DC
  Administered 2016-12-03 – 2016-12-09 (×12): 24 ug via ORAL
  Filled 2016-12-02 (×12): qty 1

## 2016-12-02 MED ORDER — GUAIFENESIN ER 600 MG PO TB12
600.0000 mg | ORAL_TABLET | Freq: Two times a day (BID) | ORAL | Status: DC
  Administered 2016-12-03 – 2016-12-09 (×14): 600 mg via ORAL
  Filled 2016-12-02 (×14): qty 1

## 2016-12-02 MED ORDER — ALBUTEROL SULFATE (2.5 MG/3ML) 0.083% IN NEBU: 2.5000 mg | INHALATION_SOLUTION | Freq: Four times a day (QID) | RESPIRATORY_TRACT | Status: DC

## 2016-12-02 MED ORDER — TRAMADOL HCL 50 MG PO TABS
150.0000 mg | ORAL_TABLET | Freq: Every day | ORAL | Status: DC
  Administered 2016-12-03 – 2016-12-08 (×7): 150 mg via ORAL
  Filled 2016-12-02 (×7): qty 3

## 2016-12-02 MED ORDER — ALBUTEROL SULFATE (2.5 MG/3ML) 0.083% IN NEBU
2.5000 mg | INHALATION_SOLUTION | Freq: Once | RESPIRATORY_TRACT | Status: AC
  Administered 2016-12-02: 2.5 mg via RESPIRATORY_TRACT
  Filled 2016-12-02: qty 3

## 2016-12-02 MED ORDER — METHYLPREDNISOLONE SODIUM SUCC 125 MG IJ SOLR
80.0000 mg | Freq: Two times a day (BID) | INTRAMUSCULAR | Status: DC
  Administered 2016-12-03: 80 mg via INTRAVENOUS
  Filled 2016-12-02: qty 2

## 2016-12-02 MED ORDER — OXYCODONE HCL 5 MG PO TABS
5.0000 mg | ORAL_TABLET | ORAL | Status: DC | PRN
  Administered 2016-12-03: 5 mg via ORAL
  Filled 2016-12-02: qty 1

## 2016-12-02 MED ORDER — PANTOPRAZOLE SODIUM 40 MG PO TBEC
40.0000 mg | DELAYED_RELEASE_TABLET | Freq: Every day | ORAL | Status: DC
  Administered 2016-12-03 – 2016-12-09 (×7): 40 mg via ORAL
  Filled 2016-12-02 (×7): qty 1

## 2016-12-02 MED ORDER — ALBUTEROL SULFATE (2.5 MG/3ML) 0.083% IN NEBU
2.5000 mg | INHALATION_SOLUTION | RESPIRATORY_TRACT | Status: DC | PRN
  Administered 2016-12-03 – 2016-12-06 (×5): 2.5 mg via RESPIRATORY_TRACT
  Filled 2016-12-02 (×5): qty 3

## 2016-12-02 MED ORDER — TOPIRAMATE 100 MG PO TABS
200.0000 mg | ORAL_TABLET | Freq: Two times a day (BID) | ORAL | Status: DC
  Administered 2016-12-03 – 2016-12-09 (×14): 200 mg via ORAL
  Filled 2016-12-02 (×14): qty 2

## 2016-12-02 MED ORDER — MORPHINE SULFATE (PF) 4 MG/ML IV SOLN
4.0000 mg | Freq: Once | INTRAVENOUS | Status: AC
  Administered 2016-12-02: 4 mg via INTRAVENOUS
  Filled 2016-12-02: qty 1

## 2016-12-02 MED ORDER — CYCLOBENZAPRINE HCL 10 MG PO TABS
10.0000 mg | ORAL_TABLET | Freq: Once | ORAL | Status: AC
  Administered 2016-12-02: 10 mg via ORAL
  Filled 2016-12-02: qty 1

## 2016-12-02 MED ORDER — SODIUM CHLORIDE 0.9 % IV SOLN: 250.0000 mL | INTRAVENOUS | Status: DC | PRN

## 2016-12-02 NOTE — ED Provider Notes (Signed)
[facility] DEPT Provider Note   CSN: [id number] Arrival date & time: 12/02/16  1628     History   Chief Complaint Chief Complaint  Patient presents with  . Shortness of Breath    HPI [name] is a 61 y.o. female.  HPI Pt states last week she was outside when it was windy. Over the last few days she has been having trouble with coughing and wheezing.  Today it got more severe.  No fevers.  No abdominal pain.  No vomiting.   Pt does not smoke currently.  She has tried her inhalers without relief.  She has also started having low back spasms after coughing.  The pain is fine when she is still but when she coughs or moves the pain is in her lower back.  No trouble with numbness or weakness.  No dysuria. Past Medical History:  Diagnosis Date  . Abnormal Pap smear of cervix   . Asthma   . Fibromyalgia     Patient Active Problem List   Diagnosis Date Noted  . Adhesive capsulitis of right shoulder 08/31/2013  . Adhesive capsulitis 07/29/2013  . Rotator cuff syndrome of left shoulder 07/29/2013  . Low back pain 12/01/2012  . BUNION 02/23/2008    Past Surgical History:  Procedure Laterality Date  . APPENDECTOMY    . BUNIONECTOMY    . COLONOSCOPY  10/08/2011   Procedure: COLONOSCOPY;  Surgeon: Dorothyann Peng, MD;  Location: [facility];  Service: Endoscopy;  Laterality: N/A;  1:45 PM  . TONSILLECTOMY    . TUBAL LIGATION      OB History    No data available       Home Medications    Prior to Admission medications   Medication Sig Start Date End Date Taking? Authorizing Provider  amLODipine (NORVASC) 5 MG tablet Take 5 mg by mouth daily. 10/26/16  Yes Historical Provider, MD  budesonide-formoterol (SYMBICORT) 160-4.5 MCG/ACT inhaler Inhale 2 puffs into the lungs 2 (two) times daily.   Yes Historical Provider, MD  Butalbital-APAP-Caffeine 50-300-40 MG CAPS Take 1 tablet by mouth daily as needed. For headache 11/20/16  Yes Historical Provider, MD  furosemide  (LASIX) 40 MG tablet Take 40 mg by mouth daily as needed. 11/20/16  Yes Historical Provider, MD  Ibuprofen (MIDOL PO) Take 2 capsules by mouth daily as needed (for pain).   Yes Historical Provider, MD  lubiprostone (AMITIZA) 24 MCG capsule Take 24 mcg by mouth 2 (two) times daily with a meal.   Yes Historical Provider, MD  oxyCODONE (OXY IR/ROXICODONE) 5 MG immediate release tablet Take 1 tablet (5 mg total) by mouth every 4 (four) hours as needed for severe pain. 12/27/15  Yes Nat Christen, MD  pantoprazole (PROTONIX) 40 MG tablet Take 40 mg by mouth daily.   Yes Historical Provider, MD  temazepam (RESTORIL) 30 MG capsule Take 30 mg by mouth at bedtime. 11/20/16  Yes Historical Provider, MD  topiramate (TOPAMAX) 200 MG tablet Take 200 mg by mouth 2 (two) times daily. 11/20/16  Yes Historical Provider, MD  traMADol (ULTRAM) 50 MG tablet Take 150 mg by mouth at bedtime. *May take during the day as needed for pain   Yes Historical Provider, MD    Family History Family History  Problem Relation Age of Onset  . Hypertension Mother   . Aneurysm Father   . Hypertension Father   . Cancer Brother   . COPD Brother     Social History Social History  Substance Use Topics  . Smoking status: Former Smoker    Types: Cigarettes  . Smokeless tobacco: Never Used  . Alcohol use No     Allergies   Patient has no known allergies.   Review of Systems Review of Systems  All other systems reviewed and are negative.    Physical Exam Updated Vital Signs BP (!) 164/80   Pulse 94   Temp 98.4 F (36.9 C) (Oral)   Resp (!) 25   Ht 5\' 8"  (1.727 m)   Wt 60.3 kg   LMP 12/13/2015   SpO2 100%   BMI 20.22 kg/m   Physical Exam  Constitutional: She appears well-developed and well-nourished. No distress.  HENT:  Head: Normocephalic and atraumatic.  Right Ear: External ear normal.  Left Ear: External ear normal.  Eyes: Conjunctivae are normal. Right eye exhibits no discharge. Left eye exhibits no  discharge. No scleral icterus.  Neck: Neck supple. No tracheal deviation present.  Cardiovascular: Normal rate, regular rhythm and intact distal pulses.   Pulmonary/Chest: Effort normal. No stridor. No respiratory distress. She has wheezes. She has no rales.  Able to speak in full sentences  Abdominal: Soft. Bowel sounds are normal. She exhibits no distension. There is no tenderness. There is no rebound and no guarding.  Musculoskeletal: She exhibits no edema.       Lumbar back: She exhibits decreased range of motion and tenderness. She exhibits no bony tenderness, no swelling, no edema and no deformity.  Neurological: She is alert. She has normal strength. No cranial nerve deficit (no facial droop, extraocular movements intact, no slurred speech) or sensory deficit. She exhibits normal muscle tone. She displays no seizure activity. Coordination normal.  Skin: Skin is warm and dry. No rash noted.  Psychiatric: She has a normal mood and affect.  Nursing note and vitals reviewed.    ED Treatments / Results  Labs (all labs ordered are listed, but only abnormal results are displayed) Labs Reviewed  BASIC METABOLIC PANEL - Abnormal; Notable for the following:       Result Value   Chloride 114 (*)    All other components within normal limits  CBC - Abnormal; Notable for the following:    Hemoglobin 11.4 (*)    All other components within normal limits  TROPONIN I    EKG  EKG Interpretation  Date/Time:  Sunday December 02 2016 16:40:59 EDT Ventricular Rate:  88 PR Interval:  146 QRS Duration: 90 QT Interval:  392 QTC Calculation: 474 R Axis:   60 Text Interpretation:  Normal sinus rhythm Normal ECG Since last tracing rate faster Confirmed by Jedi Catalfamo  MD-J, Trevion Hoben ([id number]) on 12/02/2016 5:14:08 PM       Radiology Dg Chest 2 View  Result Date: 12/02/2016 CLINICAL DATA:  Shortness of breath and cough for several days EXAM: CHEST  2 VIEW COMPARISON:  10/24/2015 FINDINGS: The heart size and  mediastinal contours are within normal limits. Both lungs are clear. The visualized skeletal structures are unremarkable. IMPRESSION: No active cardiopulmonary disease. Electronically Signed   By: Inez Catalina M.D.   On: 12/02/2016 17:27    Procedures Procedures (including critical care time)  Medications Ordered in ED Medications  cyclobenzaprine (FLEXERIL) tablet 10 mg (not administered)  morphine 4 MG/ML injection 4 mg (not administered)  ipratropium-albuterol (DUONEB) 0.5-2.5 (3) MG/3ML nebulizer solution 3 mL (3 mLs Nebulization Given 12/02/16 1928)  albuterol (PROVENTIL) (2.5 MG/3ML) 0.083% nebulizer solution 2.5 mg (2.5 mg Nebulization Given 12/02/16 1928)  methylPREDNISolone sodium succinate (SOLU-MEDROL) 125 mg/2 mL injection 125 mg (125 mg Intravenous Given 12/02/16 2027)  albuterol (PROVENTIL,VENTOLIN) solution continuous neb (10 mg/hr Nebulization Given 12/02/16 2008)  HYDROcodone-acetaminophen (NORCO/VICODIN) 5-325 MG per tablet 1 tablet (1 tablet Oral Given 12/02/16 1958)  ibuprofen (ADVIL,MOTRIN) tablet 400 mg (400 mg Oral Given 12/02/16 1958)     Initial Impression / Assessment and Plan / ED Course  I have reviewed the triage vital signs and the nursing notes.  Pertinent labs & imaging results that were available during my care of the patient were reviewed by me and considered in my medical decision making (see chart for details).  Clinical Course as of Dec 03 2130  Nancy Fetter Dec 02, 2016  2124 Pt is complaining of persistent wheezing.  Also with lower back spasms still.  Seems to be triggered by the cough.  Will try adding a muscle relaxant.  Will consult for admission  [JK]    Clinical Course User Index [JK] Dorie Rank, MD    Patient presented to emergency room with complaints of wheezing and shortness of breath. She has history of COPD asthma. She continues to have breathing difficulties despite treatment in the emergency room.  He feels like she needs to be admitted to the  hospital.  Patient is also having some low back pain. I suspect this is related to muscular spasm and strain. I will add on muscle relaxants and given an additional dose of pain medication.  No signs suggesting any neurological or vascular etiology. Final Clinical Impressions(s) / ED Diagnoses   Final diagnoses:  COPD exacerbation ([place])  Strain of lumbar region, initial encounter      Dorie Rank, MD 12/02/16 2132

## 2016-12-02 NOTE — ED Notes (Signed)
PT c/o SOB on exertion with wheezing unrelieved by home neb treatments worsening x3 days.

## 2016-12-03 DIAGNOSIS — K219 Gastro-esophageal reflux disease without esophagitis: Secondary | ICD-10-CM | POA: Diagnosis present

## 2016-12-03 DIAGNOSIS — F411 Generalized anxiety disorder: Secondary | ICD-10-CM | POA: Diagnosis present

## 2016-12-03 DIAGNOSIS — Z79899 Other long term (current) drug therapy: Secondary | ICD-10-CM | POA: Diagnosis not present

## 2016-12-03 DIAGNOSIS — F329 Major depressive disorder, single episode, unspecified: Secondary | ICD-10-CM | POA: Diagnosis not present

## 2016-12-03 DIAGNOSIS — Z825 Family history of asthma and other chronic lower respiratory diseases: Secondary | ICD-10-CM | POA: Diagnosis not present

## 2016-12-03 DIAGNOSIS — M545 Low back pain: Secondary | ICD-10-CM | POA: Diagnosis not present

## 2016-12-03 DIAGNOSIS — M797 Fibromyalgia: Secondary | ICD-10-CM | POA: Diagnosis not present

## 2016-12-03 DIAGNOSIS — Z8249 Family history of ischemic heart disease and other diseases of the circulatory system: Secondary | ICD-10-CM | POA: Diagnosis not present

## 2016-12-03 DIAGNOSIS — J45901 Unspecified asthma with (acute) exacerbation: Secondary | ICD-10-CM | POA: Diagnosis not present

## 2016-12-03 DIAGNOSIS — J4521 Mild intermittent asthma with (acute) exacerbation: Secondary | ICD-10-CM | POA: Diagnosis not present

## 2016-12-03 DIAGNOSIS — B379 Candidiasis, unspecified: Secondary | ICD-10-CM | POA: Diagnosis not present

## 2016-12-03 DIAGNOSIS — Z8673 Personal history of transient ischemic attack (TIA), and cerebral infarction without residual deficits: Secondary | ICD-10-CM | POA: Diagnosis not present

## 2016-12-03 DIAGNOSIS — M75102 Unspecified rotator cuff tear or rupture of left shoulder, not specified as traumatic: Secondary | ICD-10-CM | POA: Diagnosis not present

## 2016-12-03 DIAGNOSIS — Z7951 Long term (current) use of inhaled steroids: Secondary | ICD-10-CM | POA: Diagnosis not present

## 2016-12-03 DIAGNOSIS — G8929 Other chronic pain: Secondary | ICD-10-CM | POA: Diagnosis not present

## 2016-12-03 DIAGNOSIS — F32A Depression, unspecified: Secondary | ICD-10-CM | POA: Diagnosis present

## 2016-12-03 DIAGNOSIS — I6930 Unspecified sequelae of cerebral infarction: Secondary | ICD-10-CM

## 2016-12-03 MED ORDER — PREDNISONE 5 MG (21) PO TBPK: 5.0000 mg | ORAL_TABLET | Freq: Three times a day (TID) | ORAL | Status: DC

## 2016-12-03 MED ORDER — PREDNISONE 5 MG (21) PO TBPK: 5.0000 mg | ORAL_TABLET | Freq: Four times a day (QID) | ORAL | Status: DC

## 2016-12-03 MED ORDER — MORPHINE SULFATE (CONCENTRATE) 10 MG/0.5ML PO SOLN
5.0000 mg | ORAL | Status: DC | PRN
  Administered 2016-12-03 – 2016-12-09 (×29): 5 mg via ORAL
  Filled 2016-12-03 (×30): qty 0.5

## 2016-12-03 MED ORDER — METHYLPREDNISOLONE SODIUM SUCC 40 MG IJ SOLR
40.0000 mg | Freq: Four times a day (QID) | INTRAMUSCULAR | Status: DC
  Administered 2016-12-03 – 2016-12-07 (×16): 40 mg via INTRAVENOUS
  Filled 2016-12-03 (×16): qty 1

## 2016-12-03 MED ORDER — PREDNISONE 5 MG (21) PO TBPK: 10.0000 mg | ORAL_TABLET | Freq: Every evening | ORAL | Status: DC

## 2016-12-03 MED ORDER — IPRATROPIUM-ALBUTEROL 0.5-2.5 (3) MG/3ML IN SOLN
3.0000 mL | Freq: Four times a day (QID) | RESPIRATORY_TRACT | Status: DC
  Administered 2016-12-03 – 2016-12-04 (×6): 3 mL via RESPIRATORY_TRACT
  Filled 2016-12-03 (×6): qty 3

## 2016-12-03 MED ORDER — PREDNISONE 5 MG (21) PO TBPK: 5.0000 mg | ORAL_TABLET | ORAL | Status: DC

## 2016-12-03 MED ORDER — PREDNISONE 5 MG (21) PO TBPK
10.0000 mg | ORAL_TABLET | Freq: Every morning | ORAL | Status: DC
  Filled 2016-12-03: qty 21

## 2016-12-03 NOTE — Progress Notes (Signed)
Subjective: She was admitted with exacerbation of asthma. She has multiple other medical problems and is complaining of some back pain. She doesn't sleep well but we've tried numerous medications for sleep and none of them have been effective. Her breathing is still giving her trouble. She's coughing nonproductively.  Objective: Vital signs in last 24 hours: Temp:  [97.7 F (36.5 C)-99.8 F (37.7 C)] 97.7 F (36.5 C) (04/16 0619) Pulse Rate:  [76-103] 103 (04/16 0619) Resp:  [13-25] 20 (04/16 0619) BP: (117-164)/(62-139) 117/62 (04/16 0619) SpO2:  [98 %-100 %] 98 % (04/16 0739) Weight:  [60.3 kg (133 lb)-63 kg (138 lb 14.2 oz)] 63 kg (138 lb 14.2 oz) (04/15 2331) Weight change:  Last BM Date: 12/01/16  Intake/Output from previous day: No intake/output data recorded.  PHYSICAL EXAM General appearance: alert, cooperative and mild distress Resp: wheezes bilaterally Cardio: regular rate and rhythm, S1, S2 normal, no murmur, click, rub or gallop GI: soft, non-tender; bowel sounds normal; no masses,  no organomegaly Extremities: extremities normal, atraumatic, no cyanosis or edema Skin warm and dry. Mucous membranes are moist  Lab Results:  Results for orders placed or performed during the hospital encounter of 12/02/16 (from the past 48 hour(s))  Basic metabolic panel     Status: Abnormal   Collection Time: 12/02/16  6:50 PM  Result Value Ref Range   Sodium 143 135 - 145 mmol/L   Potassium 3.8 3.5 - 5.1 mmol/L   Chloride 114 (H) 101 - 111 mmol/L   CO2 24 22 - 32 mmol/L   Glucose, Bld 97 65 - 99 mg/dL   BUN 9 6 - 20 mg/dL   Creatinine, Ser 0.84 0.44 - 1.00 mg/dL   Calcium 8.9 8.9 - 10.3 mg/dL   GFR calc non Af Amer >60 >60 mL/min   GFR calc Af Amer >60 >60 mL/min    Comment: (NOTE) The eGFR has been calculated using the CKD EPI equation. This calculation has not been validated in all clinical situations. eGFR's persistently <60 mL/min signify possible Chronic  Kidney Disease.    Anion gap 5 5 - 15  CBC     Status: Abnormal   Collection Time: 12/02/16  6:50 PM  Result Value Ref Range   WBC 5.3 4.0 - 10.5 K/uL   RBC 4.06 3.87 - 5.11 MIL/uL   Hemoglobin 11.4 (L) 12.0 - 15.0 g/dL   HCT 36.2 36.0 - 46.0 %   MCV 89.2 78.0 - 100.0 fL   MCH 28.1 26.0 - 34.0 pg   MCHC 31.5 30.0 - 36.0 g/dL   RDW 15.4 11.5 - 15.5 %   Platelets 303 150 - 400 K/uL  Troponin I     Status: None   Collection Time: 12/02/16  6:50 PM  Result Value Ref Range   Troponin I <0.03 <0.03 ng/mL    ABGS No results for input(s): PHART, PO2ART, TCO2, HCO3 in the last 72 hours.  Invalid input(s): PCO2 CULTURES No results found for this or any previous visit (from the past 240 hour(s)). Studies/Results: Dg Chest 2 View  Result Date: 12/02/2016 CLINICAL DATA:  Shortness of breath and cough for several days EXAM: CHEST  2 VIEW COMPARISON:  10/24/2015 FINDINGS: The heart size and mediastinal contours are within normal limits. Both lungs are clear. The visualized skeletal structures are unremarkable. IMPRESSION: No active cardiopulmonary disease. Electronically Signed   By: Inez Catalina M.D.   On: 12/02/2016 17:27    Medications:  Prior to Admission:  Prescriptions Prior  to Admission  Medication Sig Dispense Refill Last Dose  . amLODipine (NORVASC) 5 MG tablet Take 5 mg by mouth daily.   12/02/2016 at Unknown time  . budesonide-formoterol (SYMBICORT) 160-4.5 MCG/ACT inhaler Inhale 2 puffs into the lungs 2 (two) times daily.   12/02/2016 at Unknown time  . Butalbital-APAP-Caffeine 50-300-40 MG CAPS Take 1 tablet by mouth daily as needed. For headache  0 Past Month at Unknown time  . furosemide (LASIX) 40 MG tablet Take 40 mg by mouth daily as needed.   Past Month at Unknown time  . Ibuprofen (MIDOL PO) Take 2 capsules by mouth daily as needed (for pain).   unknown  . lubiprostone (AMITIZA) 24 MCG capsule Take 24 mcg by mouth 2 (two) times daily with a meal.   12/02/2016 at Unknown  time  . oxyCODONE (OXY IR/ROXICODONE) 5 MG immediate release tablet Take 1 tablet (5 mg total) by mouth every 4 (four) hours as needed for severe pain. 15 tablet 0 unknown  . pantoprazole (PROTONIX) 40 MG tablet Take 40 mg by mouth daily.   12/02/2016 at Unknown time  . temazepam (RESTORIL) 30 MG capsule Take 30 mg by mouth at bedtime.   12/01/2016 at Unknown time  . topiramate (TOPAMAX) 200 MG tablet Take 200 mg by mouth 2 (two) times daily.   12/02/2016 at Unknown time  . traMADol (ULTRAM) 50 MG tablet Take 150 mg by mouth at bedtime. *May take during the day as needed for pain   12/01/2016 at Unknown time   Scheduled: . amLODipine  5 mg Oral Daily  . guaiFENesin  600 mg Oral BID  . ipratropium-albuterol  3 mL Nebulization Q6H  . lubiprostone  24 mcg Oral BID WC  . methylPREDNISolone (SOLU-MEDROL) injection  40 mg Intravenous Q6H  . pantoprazole  40 mg Oral Daily  . sodium chloride flush  3 mL Intravenous Q12H  . topiramate  200 mg Oral BID  . traMADol  150 mg Oral QHS   Continuous:  DDU:KGURKY chloride, albuterol, morphine CONCENTRATE, sodium chloride flush  Assesment: She was admitted with asthma. She is not clear. She is going to need further treatment. She is listed as being on oral prednisone I'm going to change that to IV Solu-Medrol. Continue with other treatments. Discontinue the sleeping medication since it doesn't work Principal Problem:   Asthma attack Active Problems:   Fibromyalgia    Plan: Switch to IV steroids. Continue other treatments.    LOS: 0 days   [name] L 12/03/2016, 8:45 AM

## 2016-12-03 NOTE — Care Management Obs Status (Signed)
[place] NOTIFICATION   Patient Details  Name: [name] MRN: [id number] Date of Birth: August 23, 1955   Medicare Observation Status Notification Given:  Yes    Javon Hupfer, Chauncey Reading, RN 12/03/2016, 10:47 AM

## 2016-12-03 NOTE — H&P (Signed)
History and Physical    [name] LOV:[id number] DOB: 07-27-1956 DOA: 12/02/2016  PCP: Alonza Bogus, MD  Patient coming from: home  Chief Complaint:   Sob, wheezing  HPI: [name] is a 61 y.o. female with medical history significant of asthma, fibromyalgia comes in with over a day of wheezing despite home alb inhaler.  No fevers.  Some sob.  Has been doing yard work and feels like the pollen has affected her.  No swelling in her legs.  Pt referred for admission for asthma exac.  Her vitals are all normal with 100% sats on RA.    Review of Systems: As per HPI otherwise 10 point review of systems negative.   Past Medical History:  Diagnosis Date  . Abnormal Pap smear of cervix   . Asthma   . Fibromyalgia     Past Surgical History:  Procedure Laterality Date  . APPENDECTOMY    . BUNIONECTOMY    . COLONOSCOPY  10/08/2011   Procedure: COLONOSCOPY;  Surgeon: Dorothyann Peng, MD;  Location: [facility];  Service: Endoscopy;  Laterality: N/A;  1:45 PM  . TONSILLECTOMY    . TUBAL LIGATION       reports that she has quit smoking. Her smoking use included Cigarettes. She has never used smokeless tobacco. She reports that she does not drink alcohol or use drugs.  No Known Allergies  Family History  Problem Relation Age of Onset  . Hypertension Mother   . Aneurysm Father   . Hypertension Father   . Cancer Brother   . COPD Brother     Prior to Admission medications   Medication Sig Start Date End Date Taking? Authorizing Provider  amLODipine (NORVASC) 5 MG tablet Take 5 mg by mouth daily. 10/26/16  Yes Historical Provider, MD  budesonide-formoterol (SYMBICORT) 160-4.5 MCG/ACT inhaler Inhale 2 puffs into the lungs 2 (two) times daily.   Yes Historical Provider, MD  Butalbital-APAP-Caffeine 50-300-40 MG CAPS Take 1 tablet by mouth daily as needed. For headache 11/20/16  Yes Historical Provider, MD  furosemide (LASIX) 40 MG tablet Take 40 mg by mouth daily as  needed. 11/20/16  Yes Historical Provider, MD  Ibuprofen (MIDOL PO) Take 2 capsules by mouth daily as needed (for pain).   Yes Historical Provider, MD  lubiprostone (AMITIZA) 24 MCG capsule Take 24 mcg by mouth 2 (two) times daily with a meal.   Yes Historical Provider, MD  oxyCODONE (OXY IR/ROXICODONE) 5 MG immediate release tablet Take 1 tablet (5 mg total) by mouth every 4 (four) hours as needed for severe pain. 12/27/15  Yes Nat Christen, MD  pantoprazole (PROTONIX) 40 MG tablet Take 40 mg by mouth daily.   Yes Historical Provider, MD  temazepam (RESTORIL) 30 MG capsule Take 30 mg by mouth at bedtime. 11/20/16  Yes Historical Provider, MD  topiramate (TOPAMAX) 200 MG tablet Take 200 mg by mouth 2 (two) times daily. 11/20/16  Yes Historical Provider, MD  traMADol (ULTRAM) 50 MG tablet Take 150 mg by mouth at bedtime. *May take during the day as needed for pain   Yes Historical Provider, MD    Physical Exam: Vitals:   12/02/16 2100 12/02/16 2130 12/02/16 2200 12/02/16 2331  BP: (!) 164/80 (!) 152/77 (!) 148/90 (!) 143/78  Pulse: 94 98 99 95  Resp: (!) 25 20 (!) 21 20  Temp:    99.8 F (37.7 C)  TempSrc:    Oral  SpO2: 100% 99% 98% 99%  Weight:  63 kg (138 lb 14.2 oz)  Height:    5\' 8"  (1.727 m)      Constitutional: NAD, calm, comfortable Vitals:   12/02/16 2100 12/02/16 2130 12/02/16 2200 12/02/16 2331  BP: (!) 164/80 (!) 152/77 (!) 148/90 (!) 143/78  Pulse: 94 98 99 95  Resp: (!) 25 20 (!) 21 20  Temp:    99.8 F (37.7 C)  TempSrc:    Oral  SpO2: 100% 99% 98% 99%  Weight:    63 kg (138 lb 14.2 oz)  Height:    5\' 8"  (1.727 m)   Eyes: PERRL, lids and conjunctivae normal ENMT: Mucous membranes are moist. Posterior pharynx clear of any exudate or lesions.Normal dentition.  Neck: normal, supple, no masses, no thyromegaly Respiratory: clear to auscultation bilaterally, no wheezing, no crackles. Normal respiratory effort. No accessory muscle use.  Cardiovascular: Regular rate and  rhythm, no murmurs / rubs / gallops. No extremity edema. 2+ pedal pulses. No carotid bruits.  Abdomen: no tenderness, no masses palpated. No hepatosplenomegaly. Bowel sounds positive.  Musculoskeletal: no clubbing / cyanosis. No joint deformity upper and lower extremities. Good ROM, no contractures. Normal muscle tone.  Skin: no rashes, lesions, ulcers. No induration Neurologic: CN 2-12 grossly intact. Sensation intact, DTR normal. Strength 5/5 in all 4.  Psychiatric: Normal judgment and insight. Alert and oriented x 3. Normal mood.    Labs on Admission: I have personally reviewed following labs and imaging studies  CBC:  Recent Labs Lab 12/02/16 1850  WBC 5.3  HGB 11.4*  HCT 36.2  MCV 89.2  PLT 606   Basic Metabolic Panel:  Recent Labs Lab 12/02/16 1850  NA 143  K 3.8  CL 114*  CO2 24  GLUCOSE 97  BUN 9  CREATININE 0.84  CALCIUM 8.9   GFR: Estimated Creatinine Clearance: 70.8 mL/min (by C-G formula based on SCr of 0.84 mg/dL).  Cardiac Enzymes:  Recent Labs Lab 12/02/16 1850  TROPONINI <0.03   Radiological Exams on Admission: Dg Chest 2 View  Result Date: 12/02/2016 CLINICAL DATA:  Shortness of breath and cough for several days EXAM: CHEST  2 VIEW COMPARISON:  10/24/2015 FINDINGS: The heart size and mediastinal contours are within normal limits. Both lungs are clear. The visualized skeletal structures are unremarkable. IMPRESSION: No active cardiopulmonary disease. Electronically Signed   By: Inez Catalina M.D.   On: 12/02/2016 17:27    Assessment/Plan 61 yo female with mild asthma exacerbation  Principal Problem:   Asthma attack- oral prednisone.  freq nebs.  obs on medical.  Active Problems:   Fibromyalgia- noted   PCP dr Luan Pulling  DVT prophylaxis:  scds  Code Status:  full Family Communication: none  Disposition Plan:  Per day team Consults called:  none Admission status:   observation   Normand Damron A MD [facility]  If 7PM-7AM,  please contact night-coverage [email or web address] Password [email or web address]  12/03/2016, 12:02 AM

## 2016-12-03 NOTE — Care Management Note (Signed)
Case Management Note  Patient Details  Name: [name] MRN: [id number] Date of Birth: 1955/11/01  Subjective/Objective: Adm with asthma exacerbation. From home, ind with ADL's. Has neb machine and inhaler PTA. Reports no issues obtaining medications.                    Action/Plan: Anticipate DC home with self care.   Expected Discharge Date:  12/04/16               Expected Discharge Plan:  Home/Self Care  In-House Referral:     Discharge planning Services  CM Consult  Post Acute Care Choice:    Choice offered to:  NA  DME Arranged:    DME Agency:     HH Arranged:    HH Agency:     Status of Service:  Completed, signed off  If discussed at [employer] of Stay Meetings, dates discussed:    Additional Comments:  Kassem Kibbe, Chauncey Reading, RN 12/03/2016, 10:53 AM

## 2016-12-04 MED ORDER — TRAZODONE HCL 50 MG PO TABS
50.0000 mg | ORAL_TABLET | Freq: Every day | ORAL | Status: DC
  Administered 2016-12-04 – 2016-12-08 (×5): 50 mg via ORAL
  Filled 2016-12-04 (×5): qty 1

## 2016-12-04 MED ORDER — IPRATROPIUM-ALBUTEROL 0.5-2.5 (3) MG/3ML IN SOLN
3.0000 mL | RESPIRATORY_TRACT | Status: DC
  Administered 2016-12-04 – 2016-12-09 (×29): 3 mL via RESPIRATORY_TRACT
  Filled 2016-12-04 (×27): qty 3

## 2016-12-04 MED ORDER — LEVOFLOXACIN IN D5W 500 MG/100ML IV SOLN
500.0000 mg | INTRAVENOUS | Status: DC
  Administered 2016-12-04 – 2016-12-06 (×3): 500 mg via INTRAVENOUS
  Filled 2016-12-04 (×3): qty 100

## 2016-12-04 NOTE — Progress Notes (Signed)
Subjective: She says she is still short of breath. She is trying not to cough and I told her that's not what we want her to do. She is still wheezing. No chest pain. She still complains of back pain. She wants to take a regular diet because she thinks she may have had an allergic reaction to Mrs. Dash.  Objective: Vital signs in last 24 hours: Temp:  [98.2 F (36.8 C)-98.8 F (37.1 C)] 98.3 F (36.8 C) (04/17 0442) Pulse Rate:  [85-117] 85 (04/17 0442) Resp:  [19-20] 20 (04/17 0442) BP: (111-143)/(63-83) 111/63 (04/17 0442) SpO2:  [96 %-100 %] 98 % (04/17 0807) FiO2 (%):  [98 %] 98 % (04/16 2033) Weight change:  Last BM Date: 12/01/16  Intake/Output from previous day: 04/16 0701 - 04/17 0700 In: 840 [P.O.:840] Out: -   PHYSICAL EXAM General appearance: alert, cooperative and mild distress Resp: rhonchi bilaterally Cardio: regular rate and rhythm, S1, S2 normal, no murmur, click, rub or gallop GI: soft, non-tender; bowel sounds normal; no masses,  no organomegaly Extremities: extremities normal, atraumatic, no cyanosis or edema Skin warm and dry. Mucous membranes are moist  Lab Results:  Results for orders placed or performed during the hospital encounter of 12/02/16 (from the past 48 hour(s))  Basic metabolic panel     Status: Abnormal   Collection Time: 12/02/16  6:50 PM  Result Value Ref Range   Sodium 143 135 - 145 mmol/L   Potassium 3.8 3.5 - 5.1 mmol/L   Chloride 114 (H) 101 - 111 mmol/L   CO2 24 22 - 32 mmol/L   Glucose, Bld 97 65 - 99 mg/dL   BUN 9 6 - 20 mg/dL   Creatinine, Ser 0.84 0.44 - 1.00 mg/dL   Calcium 8.9 8.9 - 10.3 mg/dL   GFR calc non Af Amer >60 >60 mL/min   GFR calc Af Amer >60 >60 mL/min    Comment: (NOTE) The eGFR has been calculated using the CKD EPI equation. This calculation has not been validated in all clinical situations. eGFR's persistently <60 mL/min signify possible Chronic Kidney Disease.    Anion gap 5 5 - 15  CBC     Status:  Abnormal   Collection Time: 12/02/16  6:50 PM  Result Value Ref Range   WBC 5.3 4.0 - 10.5 K/uL   RBC 4.06 3.87 - 5.11 MIL/uL   Hemoglobin 11.4 (L) 12.0 - 15.0 g/dL   HCT 36.2 36.0 - 46.0 %   MCV 89.2 78.0 - 100.0 fL   MCH 28.1 26.0 - 34.0 pg   MCHC 31.5 30.0 - 36.0 g/dL   RDW 15.4 11.5 - 15.5 %   Platelets 303 150 - 400 K/uL  Troponin I     Status: None   Collection Time: 12/02/16  6:50 PM  Result Value Ref Range   Troponin I <0.03 <0.03 ng/mL    ABGS No results for input(s): PHART, PO2ART, TCO2, HCO3 in the last 72 hours.  Invalid input(s): PCO2 CULTURES No results found for this or any previous visit (from the past 240 hour(s)). Studies/Results: Dg Chest 2 View  Result Date: 12/02/2016 CLINICAL DATA:  Shortness of breath and cough for several days EXAM: CHEST  2 VIEW COMPARISON:  10/24/2015 FINDINGS: The heart size and mediastinal contours are within normal limits. Both lungs are clear. The visualized skeletal structures are unremarkable. IMPRESSION: No active cardiopulmonary disease. Electronically Signed   By: Inez Catalina M.D.   On: 12/02/2016 17:27  Medications:  Prior to Admission:  Prescriptions Prior to Admission  Medication Sig Dispense Refill Last Dose  . amLODipine (NORVASC) 5 MG tablet Take 5 mg by mouth daily.   12/02/2016 at Unknown time  . budesonide-formoterol (SYMBICORT) 160-4.5 MCG/ACT inhaler Inhale 2 puffs into the lungs 2 (two) times daily.   12/02/2016 at Unknown time  . Butalbital-APAP-Caffeine 50-300-40 MG CAPS Take 1 tablet by mouth daily as needed. For headache  0 Past Month at Unknown time  . furosemide (LASIX) 40 MG tablet Take 40 mg by mouth daily as needed.   Past Month at Unknown time  . Ibuprofen (MIDOL PO) Take 2 capsules by mouth daily as needed (for pain).   unknown  . lubiprostone (AMITIZA) 24 MCG capsule Take 24 mcg by mouth 2 (two) times daily with a meal.   12/02/2016 at Unknown time  . oxyCODONE (OXY IR/ROXICODONE) 5 MG immediate  release tablet Take 1 tablet (5 mg total) by mouth every 4 (four) hours as needed for severe pain. 15 tablet 0 unknown  . pantoprazole (PROTONIX) 40 MG tablet Take 40 mg by mouth daily.   12/02/2016 at Unknown time  . temazepam (RESTORIL) 30 MG capsule Take 30 mg by mouth at bedtime.   12/01/2016 at Unknown time  . topiramate (TOPAMAX) 200 MG tablet Take 200 mg by mouth 2 (two) times daily.   12/02/2016 at Unknown time  . traMADol (ULTRAM) 50 MG tablet Take 150 mg by mouth at bedtime. *May take during the day as needed for pain   12/01/2016 at Unknown time   Scheduled: . amLODipine  5 mg Oral Daily  . guaiFENesin  600 mg Oral BID  . ipratropium-albuterol  3 mL Nebulization Q6H  . levofloxacin (LEVAQUIN) IV  500 mg Intravenous Q24H  . lubiprostone  24 mcg Oral BID WC  . methylPREDNISolone (SOLU-MEDROL) injection  40 mg Intravenous Q6H  . pantoprazole  40 mg Oral Daily  . sodium chloride flush  3 mL Intravenous Q12H  . topiramate  200 mg Oral BID  . traMADol  150 mg Oral QHS  . traZODone  50 mg Oral QHS   Continuous:  BJY:NWGNFA chloride, albuterol, morphine CONCENTRATE, sodium chloride flush  Assesment: She was admitted with asthma. I think she probably has acute bronchitis as well. She has much more chest congestion today than yesterday. I explained to her the importance of coughing and trying to get the sputum out of her chest. She has a history of stroke in the past but no current neurological issues. She continues to have chronic low back pain. Her anxiety and depression are fairly well controlled. Principal Problem:   Asthma attack Active Problems:   Low back pain   Rotator cuff syndrome of left shoulder   Fibromyalgia   Generalized anxiety disorder   Depression   GERD (gastroesophageal reflux disease)   Personal history of stroke with residual effects    Plan: Add IV antibiotics. Add flutter valve. Add incentive spirometry    LOS: 1 day   Paiton Boultinghouse L 12/04/2016, 8:33  AM

## 2016-12-04 NOTE — Progress Notes (Signed)
CO mild headache 4-5 around 1400 but states it is better. Pain behind her eyes. Medication and dark room as well as TV sounds help.

## 2016-12-05 MED ORDER — ENOXAPARIN SODIUM 40 MG/0.4ML ~~LOC~~ SOLN
40.0000 mg | SUBCUTANEOUS | Status: DC
  Administered 2016-12-05 – 2016-12-08 (×4): 40 mg via SUBCUTANEOUS
  Filled 2016-12-05 (×4): qty 0.4

## 2016-12-05 MED ORDER — HYDROCODONE-HOMATROPINE 5-1.5 MG/5ML PO SYRP
5.0000 mL | ORAL_SOLUTION | ORAL | Status: DC | PRN
  Administered 2016-12-05 – 2016-12-09 (×18): 5 mL via ORAL
  Filled 2016-12-05 (×18): qty 5

## 2016-12-05 MED ORDER — MOMETASONE FURO-FORMOTEROL FUM 200-5 MCG/ACT IN AERO
2.0000 | INHALATION_SPRAY | Freq: Two times a day (BID) | RESPIRATORY_TRACT | Status: DC
  Administered 2016-12-05 – 2016-12-09 (×9): 2 via RESPIRATORY_TRACT
  Filled 2016-12-05: qty 8.8

## 2016-12-05 NOTE — Progress Notes (Signed)
Subjective: She still has shortness of breath cough and wheezing. She is complaining that the SCDs have made her fibromyalgia worse and she wants to come off of that. I think that's reasonable. She is on IV antibiotics IV steroids inhaled bronchodilators but has not cleared. Her asthma is generally mild intermittent but she's having a severe exacerbation. She had a headache yesterday. She complains of leg pain today. She is coughing and is sore from coughing  Objective: Vital signs in last 24 hours: Temp:  [98.4 F (36.9 C)-98.7 F (37.1 C)] 98.7 F (37.1 C) (04/18 0543) Pulse Rate:  [84-95] 89 (04/18 0543) Resp:  [18-19] 18 (04/18 0543) BP: (113-125)/(57-77) 113/57 (04/18 0543) SpO2:  [96 %-99 %] 99 % (04/18 0733) FiO2 (%):  [96 %] 96 % (04/17 2331) Weight change:  Last BM Date: 12/02/16  Intake/Output from previous day: 04/17 0701 - 04/18 0700 In: 1285 [P.O.:1030; I.V.:5; IV Piggyback:250] Out: -   PHYSICAL EXAM General appearance: alert, cooperative and mild distress Resp: rhonchi bilaterally Cardio: regular rate and rhythm, S1, S2 normal, no murmur, click, rub or gallop GI: soft, non-tender; bowel sounds normal; no masses,  no organomegaly Extremities: extremities normal, atraumatic, no cyanosis or edema Skin warm and dry. Mucous membranes are moist  Lab Results:  No results found for this or any previous visit (from the past 48 hour(s)).  ABGS No results for input(s): PHART, PO2ART, TCO2, HCO3 in the last 72 hours.  Invalid input(s): PCO2 CULTURES No results found for this or any previous visit (from the past 240 hour(s)). Studies/Results: No results found.  Medications:  Prior to Admission:  Prescriptions Prior to Admission  Medication Sig Dispense Refill Last Dose  . amLODipine (NORVASC) 5 MG tablet Take 5 mg by mouth daily.   12/02/2016 at Unknown time  . budesonide-formoterol (SYMBICORT) 160-4.5 MCG/ACT inhaler Inhale 2 puffs into the lungs 2 (two) times daily.    12/02/2016 at Unknown time  . Butalbital-APAP-Caffeine 50-300-40 MG CAPS Take 1 tablet by mouth daily as needed. For headache  0 Past Month at Unknown time  . furosemide (LASIX) 40 MG tablet Take 40 mg by mouth daily as needed.   Past Month at Unknown time  . Ibuprofen (MIDOL PO) Take 2 capsules by mouth daily as needed (for pain).   unknown  . lubiprostone (AMITIZA) 24 MCG capsule Take 24 mcg by mouth 2 (two) times daily with a meal.   12/02/2016 at Unknown time  . oxyCODONE (OXY IR/ROXICODONE) 5 MG immediate release tablet Take 1 tablet (5 mg total) by mouth every 4 (four) hours as needed for severe pain. 15 tablet 0 unknown  . pantoprazole (PROTONIX) 40 MG tablet Take 40 mg by mouth daily.   12/02/2016 at Unknown time  . temazepam (RESTORIL) 30 MG capsule Take 30 mg by mouth at bedtime.   12/01/2016 at Unknown time  . topiramate (TOPAMAX) 200 MG tablet Take 200 mg by mouth 2 (two) times daily.   12/02/2016 at Unknown time  . traMADol (ULTRAM) 50 MG tablet Take 150 mg by mouth at bedtime. *May take during the day as needed for pain   12/01/2016 at Unknown time   Scheduled: . amLODipine  5 mg Oral Daily  . enoxaparin (LOVENOX) injection  40 mg Subcutaneous Q24H  . guaiFENesin  600 mg Oral BID  . ipratropium-albuterol  3 mL Nebulization Q4H  . lubiprostone  24 mcg Oral BID WC  . methylPREDNISolone (SOLU-MEDROL) injection  40 mg Intravenous Q6H  . mometasone-formoterol  2 puff Inhalation BID  . pantoprazole  40 mg Oral Daily  . sodium chloride flush  3 mL Intravenous Q12H  . topiramate  200 mg Oral BID  . traMADol  150 mg Oral QHS  . traZODone  50 mg Oral QHS   Continuous: . sodium chloride    . levofloxacin (LEVAQUIN) IV     KXF:GHWEXH chloride, albuterol, HYDROcodone-homatropine, morphine CONCENTRATE, sodium chloride flush  Assesment: She is admitted with exacerbation of asthma. She has chronic pain. She has fibromyalgia and that seems worse. She's had trouble with anxiety and depression  and she seems to be having a little more trouble with the anxiety because of her breathing difficulty. She's complaining of pain in her legs from the SCDs so this will be discontinued. She has a severe cough which is mostly nonproductive. Principal Problem:   Asthma attack Active Problems:   Low back pain   Rotator cuff syndrome of left shoulder   Fibromyalgia   Generalized anxiety disorder   Depression   GERD (gastroesophageal reflux disease)   Personal history of stroke with residual effects    Plan: DC SCDs. Start Lovenox. I'm going to give her cough medication to see if we can do anything to help keep her from having such a severe cough and may be having the cough be productive when she has it. Add dulera    LOS: 2 days   Rashad Obeid L 12/05/2016, 8:33 AM

## 2016-12-06 LAB — IRON AND TIBC
IRON: 29 ug/dL (ref 28–170)
SATURATION RATIOS: 8 % — AB (ref 10.4–31.8)
TIBC: 365 ug/dL (ref 250–450)
UIBC: 336 ug/dL

## 2016-12-06 MED ORDER — BISACODYL 5 MG PO TBEC
5.0000 mg | DELAYED_RELEASE_TABLET | Freq: Every day | ORAL | Status: DC | PRN
  Administered 2016-12-06 – 2016-12-08 (×3): 5 mg via ORAL
  Filled 2016-12-06 (×3): qty 1

## 2016-12-06 NOTE — Progress Notes (Signed)
Subjective: She was admitted with acute asthma attack. At baseline she is known to have asthma but uses her rescue inhaler very rarely. She has had difficulty clearing this. She is still coughing. She is still short of breath. She is still anxious. She has some chest pain that she associates with her cough. She's not coughing much up but she is getting up a little bit.  Objective: Vital signs in last 24 hours: Temp:  [98.5 F (36.9 C)-98.8 F (37.1 C)] 98.6 F (37 C) (04/19 0500) Pulse Rate:  [75-94] 94 (04/19 0500) Resp:  [16] 16 (04/19 0500) BP: (113-143)/(63-78) 113/67 (04/19 0500) SpO2:  [97 %-100 %] 97 % (04/19 0817) Weight change:  Last BM Date: 12/05/16  Intake/Output from previous day: 04/18 0701 - 04/19 0700 In: 840 [P.O.:840] Out: -   PHYSICAL EXAM General appearance: alert, cooperative and mild distress Resp: She has rhonchi and wheezing bilaterally. She is moving air better. Her respiratory effort is still increased Cardio: regular rate and rhythm, S1, S2 normal, no murmur, click, rub or gallop GI: soft, non-tender; bowel sounds normal; no masses,  no organomegaly Extremities: extremities normal, atraumatic, no cyanosis or edema Skin warm and dry.  Lab Results:  No results found for this or any previous visit (from the past 48 hour(s)).  ABGS No results for input(s): PHART, PO2ART, TCO2, HCO3 in the last 72 hours.  Invalid input(s): PCO2 CULTURES No results found for this or any previous visit (from the past 240 hour(s)). Studies/Results: No results found.  Medications:  Prior to Admission:  Prescriptions Prior to Admission  Medication Sig Dispense Refill Last Dose  . amLODipine (NORVASC) 5 MG tablet Take 5 mg by mouth daily.   12/02/2016 at Unknown time  . budesonide-formoterol (SYMBICORT) 160-4.5 MCG/ACT inhaler Inhale 2 puffs into the lungs 2 (two) times daily.   12/02/2016 at Unknown time  . Butalbital-APAP-Caffeine 50-300-40 MG CAPS Take 1 tablet by  mouth daily as needed. For headache  0 Past Month at Unknown time  . furosemide (LASIX) 40 MG tablet Take 40 mg by mouth daily as needed.   Past Month at Unknown time  . Ibuprofen (MIDOL PO) Take 2 capsules by mouth daily as needed (for pain).   unknown  . lubiprostone (AMITIZA) 24 MCG capsule Take 24 mcg by mouth 2 (two) times daily with a meal.   12/02/2016 at Unknown time  . oxyCODONE (OXY IR/ROXICODONE) 5 MG immediate release tablet Take 1 tablet (5 mg total) by mouth every 4 (four) hours as needed for severe pain. 15 tablet 0 unknown  . pantoprazole (PROTONIX) 40 MG tablet Take 40 mg by mouth daily.   12/02/2016 at Unknown time  . temazepam (RESTORIL) 30 MG capsule Take 30 mg by mouth at bedtime.   12/01/2016 at Unknown time  . topiramate (TOPAMAX) 200 MG tablet Take 200 mg by mouth 2 (two) times daily.   12/02/2016 at Unknown time  . traMADol (ULTRAM) 50 MG tablet Take 150 mg by mouth at bedtime. *May take during the day as needed for pain   12/01/2016 at Unknown time   Scheduled: . amLODipine  5 mg Oral Daily  . enoxaparin (LOVENOX) injection  40 mg Subcutaneous Q24H  . guaiFENesin  600 mg Oral BID  . ipratropium-albuterol  3 mL Nebulization Q4H  . lubiprostone  24 mcg Oral BID WC  . methylPREDNISolone (SOLU-MEDROL) injection  40 mg Intravenous Q6H  . mometasone-formoterol  2 puff Inhalation BID  . pantoprazole  40 mg  Oral Daily  . sodium chloride flush  3 mL Intravenous Q12H  . topiramate  200 mg Oral BID  . traMADol  150 mg Oral QHS  . traZODone  50 mg Oral QHS   Continuous: . sodium chloride    . levofloxacin (LEVAQUIN) IV     SAY:TKZSWF chloride, albuterol, bisacodyl, HYDROcodone-homatropine, morphine CONCENTRATE, sodium chloride flush  Assesment: She has had a severe asthma attack and has had difficulty clearing. She's better but not at a point that she can be discharged yet. She is still short of breath with minimal exertion. She is still coughing. Still wheezing and having  rhonchi. Additionally she's had a flareup of fibromyalgia and has diffuse pain but I think that's somewhat better. She has reflux but that seems well controlled. She has anxiety and depression and she is having a lot more anxiety I think because of being short of breath and because of medications. Principal Problem:   Asthma attack Active Problems:   Low back pain   Rotator cuff syndrome of left shoulder   Fibromyalgia   Generalized anxiety disorder   Depression   GERD (gastroesophageal reflux disease)   Personal history of stroke with residual effects    Plan: Continue current treatments    LOS: 3 days   Shaurya Rawdon L 12/06/2016, 8:36 AM

## 2016-12-07 MED ORDER — LEVOFLOXACIN 500 MG PO TABS
500.0000 mg | ORAL_TABLET | Freq: Every day | ORAL | Status: DC
  Administered 2016-12-07 – 2016-12-09 (×3): 500 mg via ORAL
  Filled 2016-12-07 (×3): qty 1

## 2016-12-07 MED ORDER — MAGIC MOUTHWASH
5.0000 mL | Freq: Three times a day (TID) | ORAL | Status: DC
  Administered 2016-12-07 – 2016-12-09 (×7): 5 mL via ORAL
  Filled 2016-12-07 (×7): qty 5

## 2016-12-07 MED ORDER — PREDNISONE 20 MG PO TABS
40.0000 mg | ORAL_TABLET | Freq: Every day | ORAL | Status: DC
  Administered 2016-12-07: 40 mg via ORAL
  Filled 2016-12-07: qty 2

## 2016-12-07 NOTE — Progress Notes (Signed)
Subjective: She says she feels okay. Her asthma is better. She complains of a sore throat. No other new complaints.  Objective: Vital signs in last 24 hours: Temp:  [97.8 F (36.6 C)-98.5 F (36.9 C)] 97.8 F (36.6 C) (04/20 0658) Pulse Rate:  [87-102] 87 (04/20 0658) Resp:  [20] 20 (04/20 0658) BP: (130-149)/(72-81) 142/74 (04/20 0658) SpO2:  [96 %-100 %] 97 % (04/20 0719) FiO2 (%):  [97 %] 97 % (04/19 1443) Weight change:  Last BM Date: 12/02/16  Intake/Output from previous day: 04/19 0701 - 04/20 0700 In: 960 [P.O.:960] Out: -   PHYSICAL EXAM General appearance: alert, cooperative and mild distress Resp: Her lungs are much clearer with somewhat prolonged expiratory phase. Cardio: regular rate and rhythm, S1, S2 normal, no murmur, click, rub or gallop GI: soft, non-tender; bowel sounds normal; no masses,  no organomegaly Extremities: extremities normal, atraumatic, no cyanosis or edema I don't see any abnormalities in her throat but I'm going to go ahead and treat her for thrush.  Lab Results:  Results for orders placed or performed during the hospital encounter of 12/02/16 (from the past 48 hour(s))  Iron and TIBC     Status: Abnormal   Collection Time: 12/06/16  8:31 AM  Result Value Ref Range   Iron 29 28 - 170 ug/dL   TIBC 365 250 - 450 ug/dL   Saturation Ratios 8 (L) 10.4 - 31.8 %   UIBC 336 ug/dL    Comment: Performed at [facility] [address] [place], [place] [address]    ABGS No results for input(s): PHART, PO2ART, TCO2, HCO3 in the last 72 hours.  Invalid input(s): PCO2 CULTURES No results found for this or any previous visit (from the past 240 hour(s)). Studies/Results: No results found.  Medications:  Prior to Admission:  Prescriptions Prior to Admission  Medication Sig Dispense Refill Last Dose  . amLODipine (NORVASC) 5 MG tablet Take 5 mg by mouth daily.   12/02/2016 at Unknown time  . budesonide-formoterol (SYMBICORT) 160-4.5  MCG/ACT inhaler Inhale 2 puffs into the lungs 2 (two) times daily.   12/02/2016 at Unknown time  . Butalbital-APAP-Caffeine 50-300-40 MG CAPS Take 1 tablet by mouth daily as needed. For headache  0 Past Month at Unknown time  . furosemide (LASIX) 40 MG tablet Take 40 mg by mouth daily as needed.   Past Month at Unknown time  . Ibuprofen (MIDOL PO) Take 2 capsules by mouth daily as needed (for pain).   unknown  . lubiprostone (AMITIZA) 24 MCG capsule Take 24 mcg by mouth 2 (two) times daily with a meal.   12/02/2016 at Unknown time  . oxyCODONE (OXY IR/ROXICODONE) 5 MG immediate release tablet Take 1 tablet (5 mg total) by mouth every 4 (four) hours as needed for severe pain. 15 tablet 0 unknown  . pantoprazole (PROTONIX) 40 MG tablet Take 40 mg by mouth daily.   12/02/2016 at Unknown time  . temazepam (RESTORIL) 30 MG capsule Take 30 mg by mouth at bedtime.   12/01/2016 at Unknown time  . topiramate (TOPAMAX) 200 MG tablet Take 200 mg by mouth 2 (two) times daily.   12/02/2016 at Unknown time  . traMADol (ULTRAM) 50 MG tablet Take 150 mg by mouth at bedtime. *May take during the day as needed for pain   12/01/2016 at Unknown time   Scheduled: . amLODipine  5 mg Oral Daily  . enoxaparin (LOVENOX) injection  40 mg Subcutaneous Q24H  . guaiFENesin  600 mg Oral BID  . ipratropium-albuterol  3 mL Nebulization Q4H  . levofloxacin  500 mg Oral Daily  . lubiprostone  24 mcg Oral BID WC  . magic mouthwash  5 mL Oral TID PC & HS  . mometasone-formoterol  2 puff Inhalation BID  . pantoprazole  40 mg Oral Daily  . predniSONE  40 mg Oral Q breakfast  . sodium chloride flush  3 mL Intravenous Q12H  . topiramate  200 mg Oral BID  . traMADol  150 mg Oral QHS  . traZODone  50 mg Oral QHS   Continuous: . sodium chloride     QDI:YMEBRA chloride, albuterol, bisacodyl, HYDROcodone-homatropine, morphine CONCENTRATE, sodium chloride flush  Assesment: She was admitted with asthma. This is been severe. Normally she  has mild intermittent asthma and uses her rescue inhaler about once a month. It's not totally clear what caused her to have some much trouble. Additionally she has chronic low back pain and that's doing pretty well. She has generalized anxiety and that is better now. She complains of a sore throat and I'm going to treat her for thrush Principal Problem:   Asthma attack Active Problems:   Low back pain   Rotator cuff syndrome of left shoulder   Fibromyalgia   Generalized anxiety disorder   Depression   GERD (gastroesophageal reflux disease)   Personal history of stroke with residual effects    Plan: As above    LOS: 4 days   Toneshia Coello L 12/07/2016, 8:11 AM

## 2016-12-08 MED ORDER — METHYLPREDNISOLONE SODIUM SUCC 40 MG IJ SOLR
40.0000 mg | Freq: Two times a day (BID) | INTRAMUSCULAR | Status: DC
  Administered 2016-12-08 – 2016-12-09 (×3): 40 mg via INTRAVENOUS
  Filled 2016-12-08 (×3): qty 1

## 2016-12-08 NOTE — Progress Notes (Signed)
Subjective: She has increasing cough. She's coughing almost incessantly during the exam. Her wheezing is worse. Her shortness of breath is worse. She has more sore throat. No chest pain nausea or vomiting.  Objective: Vital signs in last 24 hours: Temp:  [98.3 F (36.8 C)-98.5 F (36.9 C)] 98.5 F (36.9 C) (04/21 0998) Pulse Rate:  [88-103] 103 (04/21 0638) Resp:  [18-19] 18 (04/21 3382) BP: (99-125)/(74-81) 99/74 (04/21 5053) SpO2:  [95 %-100 %] 100 % (04/21 0757) FiO2 (%):  [96 %-98 %] 98 % (04/21 0407) Weight change:  Last BM Date: 12/06/16  Intake/Output from previous day: 04/20 0701 - 04/21 0700 In: 2220 [P.O.:2220] Out: -   PHYSICAL EXAM General appearance: alert, cooperative and moderate distress Resp: rhonchi bilaterally and wheezes bilaterally Cardio: regular rate and rhythm, S1, S2 normal, no murmur, click, rub or gallop GI: soft, non-tender; bowel sounds normal; no masses,  no organomegaly Extremities: extremities normal, atraumatic, no cyanosis or edema Skin warm and dry. Her throat is mildly erythematous  Lab Results:  No results found for this or any previous visit (from the past 48 hour(s)).  ABGS No results for input(s): PHART, PO2ART, TCO2, HCO3 in the last 72 hours.  Invalid input(s): PCO2 CULTURES No results found for this or any previous visit (from the past 240 hour(s)). Studies/Results: No results found.  Medications:  Prior to Admission:  Prescriptions Prior to Admission  Medication Sig Dispense Refill Last Dose  . amLODipine (NORVASC) 5 MG tablet Take 5 mg by mouth daily.   12/02/2016 at Unknown time  . budesonide-formoterol (SYMBICORT) 160-4.5 MCG/ACT inhaler Inhale 2 puffs into the lungs 2 (two) times daily.   12/02/2016 at Unknown time  . Butalbital-APAP-Caffeine 50-300-40 MG CAPS Take 1 tablet by mouth daily as needed. For headache  0 Past Month at Unknown time  . furosemide (LASIX) 40 MG tablet Take 40 mg by mouth daily as needed.   Past  Month at Unknown time  . Ibuprofen (MIDOL PO) Take 2 capsules by mouth daily as needed (for pain).   unknown  . lubiprostone (AMITIZA) 24 MCG capsule Take 24 mcg by mouth 2 (two) times daily with a meal.   12/02/2016 at Unknown time  . oxyCODONE (OXY IR/ROXICODONE) 5 MG immediate release tablet Take 1 tablet (5 mg total) by mouth every 4 (four) hours as needed for severe pain. 15 tablet 0 unknown  . pantoprazole (PROTONIX) 40 MG tablet Take 40 mg by mouth daily.   12/02/2016 at Unknown time  . temazepam (RESTORIL) 30 MG capsule Take 30 mg by mouth at bedtime.   12/01/2016 at Unknown time  . topiramate (TOPAMAX) 200 MG tablet Take 200 mg by mouth 2 (two) times daily.   12/02/2016 at Unknown time  . traMADol (ULTRAM) 50 MG tablet Take 150 mg by mouth at bedtime. *May take during the day as needed for pain   12/01/2016 at Unknown time   Scheduled: . amLODipine  5 mg Oral Daily  . enoxaparin (LOVENOX) injection  40 mg Subcutaneous Q24H  . guaiFENesin  600 mg Oral BID  . ipratropium-albuterol  3 mL Nebulization Q4H  . levofloxacin  500 mg Oral Daily  . lubiprostone  24 mcg Oral BID WC  . magic mouthwash  5 mL Oral TID PC & HS  . methylPREDNISolone (SOLU-MEDROL) injection  40 mg Intravenous Q12H  . mometasone-formoterol  2 puff Inhalation BID  . pantoprazole  40 mg Oral Daily  . sodium chloride flush  3 mL Intravenous  Q12H  . topiramate  200 mg Oral BID  . traMADol  150 mg Oral QHS  . traZODone  50 mg Oral QHS   Continuous: . sodium chloride     FKC:LEXNTZ chloride, albuterol, bisacodyl, HYDROcodone-homatropine, morphine CONCENTRATE, sodium chloride flush  Assesment: She was admitted with exacerbation of asthma. I had hoped that she would be ready for discharge today but she is not. She is still congested. Still wheezing. Significant cough.  She has chronic low back pain which is about the same.  She has generalized anxiety disorder and depression but they're doing pretty well. Principal  Problem:   Asthma attack Active Problems:   Low back pain   Rotator cuff syndrome of left shoulder   Fibromyalgia   Generalized anxiety disorder   Depression   GERD (gastroesophageal reflux disease)   Personal history of stroke with residual effects    Plan: Continue current treatments restart IV steroids she's not ready for discharge    LOS: 5 days   Hasten Sweitzer L 12/08/2016, 10:53 AM

## 2016-12-09 MED ORDER — HYDROCODONE-HOMATROPINE 5-1.5 MG/5ML PO SYRP: 5.0000 mL | ORAL_SOLUTION | ORAL | 0 refills | Status: DC | PRN

## 2016-12-09 MED ORDER — GUAIFENESIN ER 600 MG PO TB12
1200.0000 mg | ORAL_TABLET | Freq: Two times a day (BID) | ORAL | 3 refills | Status: DC
Start: 2016-12-09 — End: 2016-12-30

## 2016-12-09 MED ORDER — LEVOFLOXACIN 500 MG PO TABS: 500.0000 mg | ORAL_TABLET | Freq: Every day | ORAL | 1 refills | Status: DC

## 2016-12-09 MED ORDER — TRAZODONE HCL 50 MG PO TABS: 50.0000 mg | ORAL_TABLET | Freq: Every day | ORAL | 12 refills | Status: DC

## 2016-12-09 MED ORDER — PREDNISONE 10 MG (21) PO TBPK: ORAL_TABLET | ORAL | 1 refills | Status: DC

## 2016-12-09 NOTE — Discharge Summary (Signed)
Physician Discharge Summary  Patient ID: [name] MRN: [id number] DOB/AGE: April 05, 1956 61 y.o. Primary Care Physician:Roland Prine L, MD Admit date: 12/02/2016 Discharge date: 12/09/2016    Discharge Diagnoses:   Principal Problem:   Asthma attack Active Problems:   Low back pain   Rotator cuff syndrome of left shoulder   Fibromyalgia   Generalized anxiety disorder   Depression   GERD (gastroesophageal reflux disease)   Personal history of stroke with residual effects   Allergies as of 12/09/2016   No Known Allergies     Medication List    STOP taking these medications   temazepam 30 MG capsule Commonly known as:  RESTORIL     TAKE these medications   amLODipine 5 MG tablet Commonly known as:  NORVASC Take 5 mg by mouth daily.   budesonide-formoterol 160-4.5 MCG/ACT inhaler Commonly known as:  SYMBICORT Inhale 2 puffs into the lungs 2 (two) times daily.   Butalbital-APAP-Caffeine 50-300-40 MG Caps Take 1 tablet by mouth daily as needed. For headache   furosemide 40 MG tablet Commonly known as:  LASIX Take 40 mg by mouth daily as needed.   guaiFENesin 600 MG 12 hr tablet Commonly known as:  MUCINEX Take 2 tablets (1,200 mg total) by mouth 2 (two) times daily.   HYDROcodone-homatropine 5-1.5 MG/5ML syrup Commonly known as:  HYCODAN Take 5 mLs by mouth every 4 (four) hours as needed for cough.   levofloxacin 500 MG tablet Commonly known as:  LEVAQUIN Take 1 tablet (500 mg total) by mouth daily.   lubiprostone 24 MCG capsule Commonly known as:  AMITIZA Take 24 mcg by mouth 2 (two) times daily with a meal.   MIDOL PO Take 2 capsules by mouth daily as needed (for pain).   oxyCODONE 5 MG immediate release tablet Commonly known as:  Oxy IR/ROXICODONE Take 1 tablet (5 mg total) by mouth every 4 (four) hours as needed for severe pain.   pantoprazole 40 MG tablet Commonly known as:  PROTONIX Take 40 mg by mouth daily.   predniSONE 10 MG (21)  Tbpk tablet Commonly known as:  STERAPRED UNI-PAK 21 TAB Take by package instructions   topiramate 200 MG tablet Commonly known as:  TOPAMAX Take 200 mg by mouth 2 (two) times daily.   traMADol 50 MG tablet Commonly known as:  ULTRAM Take 150 mg by mouth at bedtime. *May take during the day as needed for pain   traZODone 50 MG tablet Commonly known as:  DESYREL Take 1 tablet (50 mg total) by mouth at bedtime.       Discharged Condition:Improved    Consults: None  Significant Diagnostic Studies: Dg Chest 2 View  Result Date: 12/02/2016 CLINICAL DATA:  Shortness of breath and cough for several days EXAM: CHEST  2 VIEW COMPARISON:  10/24/2015 FINDINGS: The heart size and mediastinal contours are within normal limits. Both lungs are clear. The visualized skeletal structures are unremarkable. IMPRESSION: No active cardiopulmonary disease. Electronically Signed   By: Inez Catalina M.D.   On: 12/02/2016 17:27    Lab Results: Basic Metabolic Panel: No results for input(s): NA, K, CL, CO2, GLUCOSE, BUN, CREATININE, CALCIUM, MG, PHOS in the last 72 hours. Liver Function Tests: No results for input(s): AST, ALT, ALKPHOS, BILITOT, PROT, ALBUMIN in the last 72 hours.   CBC: No results for input(s): WBC, NEUTROABS, HGB, HCT, MCV, PLT in the last 72 hours.  No results found for this or any previous visit (from the past 240 hour(s)).  Hospital Course: This is a 61 year old who came to the emergency department for shortness of breath and wheezing. She has asthma but it is generally been controlled and she does not need to use her rescue inhaler much at all. She was started on IV steroids IV antibiotics and inhaled bronchodilators but was slow to clear. She finally got back to baseline and was ready for discharge. Her lungs were clear at that point. She is still coughing up a little bit of sputum.  Discharge Exam: Blood pressure 113/67, pulse 93, temperature 98.4 F (36.9 C), temperature  source Oral, resp. rate 18, height 5\' 8"  (1.727 m), weight 63 kg (138 lb 14.2 oz), last menstrual period 12/13/2015, SpO2 100 %. She is awake and alert. Her chest is clear. Heart is regular.  Disposition: Home      Signed: Larina Lieurance L   12/09/2016, 10:25 AM

## 2016-12-09 NOTE — Progress Notes (Signed)
Patient discharged with instructions, prescription, and care notes.  Verbalized understanding via teach back.  IV was removed and the site was WNL. Patient voiced no further complaints or concerns at the time of discharge.  Appointments scheduled per instructions.  Patient left the floor via w/c family  And staff in stable condition. 

## 2016-12-09 NOTE — Progress Notes (Signed)
Subjective: She says she feels better. No complaints. No shortness of breath or wheezing now.  Objective: Vital signs in last 24 hours: Temp:  [98.1 F (36.7 C)-98.6 F (37 C)] 98.4 F (36.9 C) (04/22 0619) Pulse Rate:  [93-100] 93 (04/22 0619) Resp:  [18] 18 (04/22 0619) BP: (95-129)/(46-81) 113/67 (04/22 0619) SpO2:  [96 %-100 %] 100 % (04/22 0849) Weight change:  Last BM Date: 12/08/16  Intake/Output from previous day: 04/21 0701 - 04/22 0700 In: 900 [P.O.:900] Out: 1600 [Urine:1600]  PHYSICAL EXAM General appearance: alert, cooperative and no distress Resp: clear to auscultation bilaterally Cardio: regular rate and rhythm, S1, S2 normal, no murmur, click, rub or gallop GI: soft, non-tender; bowel sounds normal; no masses,  no organomegaly Extremities: extremities normal, atraumatic, no cyanosis or edema Skin warm and dry.  Lab Results:  No results found for this or any previous visit (from the past 48 hour(s)).  ABGS No results for input(s): PHART, PO2ART, TCO2, HCO3 in the last 72 hours.  Invalid input(s): PCO2 CULTURES No results found for this or any previous visit (from the past 240 hour(s)). Studies/Results: No results found.  Medications:  Prior to Admission:  No prescriptions prior to admission.   Scheduled: . amLODipine  5 mg Oral Daily  . enoxaparin (LOVENOX) injection  40 mg Subcutaneous Q24H  . guaiFENesin  600 mg Oral BID  . ipratropium-albuterol  3 mL Nebulization Q4H  . levofloxacin  500 mg Oral Daily  . lubiprostone  24 mcg Oral BID WC  . magic mouthwash  5 mL Oral TID PC & HS  . methylPREDNISolone (SOLU-MEDROL) injection  40 mg Intravenous Q12H  . mometasone-formoterol  2 puff Inhalation BID  . pantoprazole  40 mg Oral Daily  . sodium chloride flush  3 mL Intravenous Q12H  . topiramate  200 mg Oral BID  . traMADol  150 mg Oral QHS  . traZODone  50 mg Oral QHS   Continuous: . sodium chloride     WKM:QKMMNO chloride, albuterol,  bisacodyl, HYDROcodone-homatropine, morphine CONCENTRATE, sodium chloride flush  Assesment: She was admitted with asthma. She is much improved. I think she's ready for discharge now. Principal Problem:   Asthma attack Active Problems:   Low back pain   Rotator cuff syndrome of left shoulder   Fibromyalgia   Generalized anxiety disorder   Depression   GERD (gastroesophageal reflux disease)   Personal history of stroke with residual effects    Plan: Discharge home today    LOS: 6 days   Lorain Keast L 12/09/2016, 10:22 AM

## 2016-12-21 DIAGNOSIS — F419 Anxiety disorder, unspecified: Secondary | ICD-10-CM | POA: Diagnosis not present

## 2016-12-21 DIAGNOSIS — J45901 Unspecified asthma with (acute) exacerbation: Secondary | ICD-10-CM | POA: Diagnosis not present

## 2016-12-21 DIAGNOSIS — M797 Fibromyalgia: Secondary | ICD-10-CM | POA: Diagnosis not present

## 2016-12-21 DIAGNOSIS — I10 Essential (primary) hypertension: Secondary | ICD-10-CM | POA: Diagnosis not present

## 2016-12-30 ENCOUNTER — Emergency Department (HOSPITAL_COMMUNITY): Payer: PPO

## 2016-12-30 ENCOUNTER — Emergency Department (HOSPITAL_COMMUNITY)
Admission: EM | Admit: 2016-12-30 | Discharge: 2016-12-30 | Disposition: A | Discharge status: Home / Self Care | Payer: PPO | Attending: Emergency Medicine | Admitting: Emergency Medicine

## 2016-12-30 ENCOUNTER — Encounter (HOSPITAL_COMMUNITY): Payer: Self-pay | Admitting: *Deleted

## 2016-12-30 DIAGNOSIS — Y939 Activity, unspecified: Secondary | ICD-10-CM | POA: Diagnosis not present

## 2016-12-30 DIAGNOSIS — Y999 Unspecified external cause status: Secondary | ICD-10-CM | POA: Diagnosis not present

## 2016-12-30 DIAGNOSIS — X58XXXA Exposure to other specified factors, initial encounter: Secondary | ICD-10-CM | POA: Insufficient documentation

## 2016-12-30 DIAGNOSIS — Z87891 Personal history of nicotine dependence: Secondary | ICD-10-CM | POA: Insufficient documentation

## 2016-12-30 DIAGNOSIS — Y929 Unspecified place or not applicable: Secondary | ICD-10-CM | POA: Insufficient documentation

## 2016-12-30 DIAGNOSIS — S39012A Strain of muscle, fascia and tendon of lower back, initial encounter: Secondary | ICD-10-CM | POA: Diagnosis not present

## 2016-12-30 DIAGNOSIS — J45909 Unspecified asthma, uncomplicated: Secondary | ICD-10-CM | POA: Insufficient documentation

## 2016-12-30 DIAGNOSIS — Z79899 Other long term (current) drug therapy: Secondary | ICD-10-CM | POA: Diagnosis not present

## 2016-12-30 DIAGNOSIS — R109 Unspecified abdominal pain: Secondary | ICD-10-CM | POA: Diagnosis not present

## 2016-12-30 DIAGNOSIS — S3992XA Unspecified injury of lower back, initial encounter: Secondary | ICD-10-CM | POA: Diagnosis not present

## 2016-12-30 LAB — CBC WITH DIFFERENTIAL/PLATELET
BASOS PCT: 1 %
Basophils Absolute: 0.1 10*3/uL (ref 0.0–0.1)
EOS ABS: 0.5 10*3/uL (ref 0.0–0.7)
EOS PCT: 6 %
HCT: 39.2 % (ref 36.0–46.0)
HEMOGLOBIN: 12.7 g/dL (ref 12.0–15.0)
LYMPHS ABS: 1.1 10*3/uL (ref 0.7–4.0)
Lymphocytes Relative: 15 %
MCH: 28.6 pg (ref 26.0–34.0)
MCHC: 32.4 g/dL (ref 30.0–36.0)
MCV: 88.3 fL (ref 78.0–100.0)
Monocytes Absolute: 0.6 10*3/uL (ref 0.1–1.0)
Monocytes Relative: 8 %
Neutro Abs: 5.2 10*3/uL (ref 1.7–7.7)
Neutrophils Relative %: 70 %
Platelets: 287 10*3/uL (ref 150–400)
RBC: 4.44 MIL/uL (ref 3.87–5.11)
RDW: 16.4 % — ABNORMAL HIGH (ref 11.5–15.5)
WBC: 7.4 10*3/uL (ref 4.0–10.5)

## 2016-12-30 LAB — COMPREHENSIVE METABOLIC PANEL
ALK PHOS: 62 U/L (ref 38–126)
ALT: 12 U/L — AB (ref 14–54)
AST: 16 U/L (ref 15–41)
Albumin: 4 g/dL (ref 3.5–5.0)
Anion gap: 8 (ref 5–15)
BUN: 12 mg/dL (ref 6–20)
CALCIUM: 8.8 mg/dL — AB (ref 8.9–10.3)
CHLORIDE: 110 mmol/L (ref 101–111)
CO2: 22 mmol/L (ref 22–32)
CREATININE: 1.05 mg/dL — AB (ref 0.44–1.00)
GFR, EST NON AFRICAN AMERICAN: 57 mL/min — AB (ref >60)
Glucose, Bld: 100 mg/dL — ABNORMAL HIGH (ref 65–99)
Potassium: 3.1 mmol/L — ABNORMAL LOW (ref 3.5–5.1)
Sodium: 140 mmol/L (ref 135–145)
Total Bilirubin: 0.5 mg/dL (ref 0.3–1.2)
Total Protein: 6.8 g/dL (ref 6.5–8.1)

## 2016-12-30 LAB — URINALYSIS, ROUTINE W REFLEX MICROSCOPIC
Bilirubin Urine: NEGATIVE
Glucose, UA: NEGATIVE mg/dL
HGB URINE DIPSTICK: NEGATIVE
Ketones, ur: NEGATIVE mg/dL
Leukocytes, UA: NEGATIVE
Nitrite: NEGATIVE
Protein, ur: NEGATIVE mg/dL
SPECIFIC GRAVITY, URINE: 1.003 — AB (ref 1.005–1.030)
pH: 5 (ref 5.0–8.0)

## 2016-12-30 MED ORDER — ONDANSETRON HCL 4 MG/2ML IJ SOLN
4.0000 mg | Freq: Once | INTRAMUSCULAR | Status: AC
  Administered 2016-12-30: 4 mg via INTRAVENOUS
  Filled 2016-12-30: qty 2

## 2016-12-30 MED ORDER — MORPHINE SULFATE (PF) 4 MG/ML IV SOLN
4.0000 mg | Freq: Once | INTRAVENOUS | Status: AC
  Administered 2016-12-30: 4 mg via INTRAVENOUS
  Filled 2016-12-30: qty 1

## 2016-12-30 MED ORDER — SODIUM CHLORIDE 0.9 % IV BOLUS (SEPSIS)
1000.0000 mL | Freq: Once | INTRAVENOUS | Status: AC
  Administered 2016-12-30: 1000 mL via INTRAVENOUS

## 2016-12-30 MED ORDER — HYDROMORPHONE HCL 1 MG/ML IJ SOLN
1.0000 mg | Freq: Once | INTRAMUSCULAR | Status: AC
  Administered 2016-12-30: 1 mg via INTRAVENOUS
  Filled 2016-12-30: qty 1

## 2016-12-30 MED ORDER — KETOROLAC TROMETHAMINE 30 MG/ML IJ SOLN
30.0000 mg | Freq: Once | INTRAMUSCULAR | Status: AC
  Administered 2016-12-30: 30 mg via INTRAVENOUS
  Filled 2016-12-30: qty 1

## 2016-12-30 NOTE — ED Provider Notes (Signed)
[facility] DEPT Provider Note   CSN: [id number] Arrival date & time: 12/30/16  1402     History   Chief Complaint Chief Complaint  Patient presents with  . Flank Pain    HPI [name] is a 60 y.o. female.  Pt presents to the ED today with left sided back pain.  Pt said that she has a lot of pain medication at home that she can take, but did not take it b/c she did not want to mask it.  The pt also has some lower abdominal pain.  She denies urinary sx.  She said that she has felt foggy since Friday (5/11).      Past Medical History:  Diagnosis Date  . Abnormal Pap smear of cervix   . Asthma   . Fibromyalgia     Patient Active Problem List   Diagnosis Date Noted  . Generalized anxiety disorder 12/03/2016  . Depression 12/03/2016  . GERD (gastroesophageal reflux disease) 12/03/2016  . Personal history of stroke with residual effects 12/03/2016  . Asthma attack 12/02/2016  . Fibromyalgia   . Adhesive capsulitis of right shoulder 08/31/2013  . Adhesive capsulitis 07/29/2013  . Rotator cuff syndrome of left shoulder 07/29/2013  . Low back pain 12/01/2012  . BUNION 02/23/2008    Past Surgical History:  Procedure Laterality Date  . APPENDECTOMY    . BUNIONECTOMY    . COLONOSCOPY  10/08/2011   Procedure: COLONOSCOPY;  Surgeon: Dorothyann Peng, MD;  Location: [facility];  Service: Endoscopy;  Laterality: N/A;  1:45 PM  . TONSILLECTOMY    . TUBAL LIGATION      OB History    No data available       Home Medications    Prior to Admission medications   Medication Sig Start Date End Date Taking? Authorizing Provider  budesonide-formoterol (SYMBICORT) 160-4.5 MCG/ACT inhaler Inhale 2 puffs into the lungs 2 (two) times daily.   Yes [provider]  Butalbital-APAP-Caffeine 50-300-40 MG CAPS Take 1 capsule by mouth 4 (four) times daily as needed (for migraines). For headache   Yes [provider]  furosemide (LASIX) 40 MG tablet Take  40 mg by mouth daily as needed for edema.    Yes [provider]  HYDROcodone-homatropine (HYCODAN) 5-1.5 MG/5ML syrup Take 5 mLs by mouth every 4 (four) hours as needed for cough. 12/09/16  Yes Sinda Du, MD  ibuprofen (ADVIL,MOTRIN) 800 MG tablet Take 800 mg by mouth every 8 (eight) hours as needed for mild pain or moderate pain.   Yes [provider]  lubiprostone (AMITIZA) 24 MCG capsule Take 24 mcg by mouth 2 (two) times daily.    Yes [provider]  pantoprazole (PROTONIX) 40 MG tablet Take 40 mg by mouth daily.   Yes [provider]  topiramate (TOPAMAX) 200 MG tablet Take 200 mg by mouth 2 (two) times daily.   Yes [provider]  traMADol (ULTRAM) 50 MG tablet Take 150 mg by mouth at bedtime.    Yes [provider]  traZODone (DESYREL) 50 MG tablet Take 1 tablet (50 mg total) by mouth at bedtime. 12/09/16  Yes Sinda Du, MD    Family History Family History  Problem Relation Age of Onset  . Hypertension Mother   . Aneurysm Father   . Hypertension Father   . Cancer Brother   . COPD Brother     Social History Social History  Substance Use Topics  . Smoking status: Former  Smoker    Types: Cigarettes  . Smokeless tobacco: Never Used  . Alcohol use No     Allergies   Patient has no known allergies.   Review of Systems Review of Systems  Gastrointestinal: Positive for abdominal pain.  Musculoskeletal: Positive for back pain.  All other systems reviewed and are negative.    Physical Exam Updated Vital Signs BP 125/82   Pulse 76   Temp 98.7 F (37.1 C) (Oral)   Resp 19   Wt 138 lb (62.6 kg)   LMP 12/13/2015   SpO2 100%   BMI 20.98 kg/m   Physical Exam  Constitutional: She is oriented to person, place, and time. She appears well-developed and well-nourished.  HENT:  Head: Normocephalic and atraumatic.  Right Ear: External ear normal.  Left Ear: External ear normal.  Nose: Nose normal.    Mouth/Throat: Oropharynx is clear and moist.  Eyes: Conjunctivae and EOM are normal. Pupils are equal, round, and reactive to light.  Neck: Normal range of motion. Neck supple.  Cardiovascular: Normal rate, regular rhythm, normal heart sounds and intact distal pulses.   Pulmonary/Chest: Effort normal and breath sounds normal.  Abdominal: Soft. There is tenderness in the suprapubic area.  Musculoskeletal: Normal range of motion.       Arms: Neurological: She is alert and oriented to person, place, and time.  Skin: Skin is warm.  Psychiatric: She has a normal mood and affect. Her behavior is normal. Judgment and thought content normal.  Nursing note and vitals reviewed.    ED Treatments / Results  Labs (all labs ordered are listed, but only abnormal results are displayed) Labs Reviewed  COMPREHENSIVE METABOLIC PANEL - Abnormal; Notable for the following:       Result Value   Potassium 3.1 (*)    Glucose, Bld 100 (*)    Creatinine, Ser 1.05 (*)    Calcium 8.8 (*)    ALT 12 (*)    GFR calc non Af Amer 57 (*)    All other components within normal limits  CBC WITH DIFFERENTIAL/PLATELET - Abnormal; Notable for the following:    RDW 16.4 (*)    All other components within normal limits  URINALYSIS, ROUTINE W REFLEX MICROSCOPIC - Abnormal; Notable for the following:    Color, Urine STRAW (*)    Specific Gravity, Urine 1.003 (*)    All other components within normal limits    EKG  EKG Interpretation None       Radiology Ct Renal Stone Study  Result Date: 12/30/2016 CLINICAL DATA:  Left flank pain EXAM: CT ABDOMEN AND PELVIS WITHOUT CONTRAST TECHNIQUE: Multidetector CT imaging of the abdomen and pelvis was performed following the standard protocol without IV contrast. COMPARISON:  CT abdomen pelvis 12/27/2015 FINDINGS: Lower chest: No pulmonary nodules or pleural effusion. No visible pericardial effusion. Hepatobiliary: Normal noncontrast appearance of the liver. No visible  biliary dilatation. Normal gallbladder. Pancreas: Normal noncontrast appearance of the pancreas. No peripancreatic fluid collection. Spleen: Normal. Adrenals/Urinary Tract: --Adrenal glands: Normal. --Right kidney/ureter: No hydronephrosis or perinephric stranding. No nephrolithiasis. No obstructing ureteral stones. --Left kidney/ureter: There is a 3 mm nonobstructing calculus near the left lower pole. There is no hydronephrosis or perinephric stranding. The left ureter is unobstructed. --Urinary bladder: Unremarkable. Stomach/Bowel: There is no hiatal hernia. The stomach and duodenum are normal. There is no dilated small bowel or enteric inflammation. There is no colonic abnormality. The appendix is surgically absent. Vascular/Lymphatic: There is atherosclerotic calcification of the non aneurysmal  abdominal aorta. No lymphadenopathy. Reproductive: Uterus and ovaries are normal. Musculoskeletal. No focal osseous lesion. Normal visualized extraperitoneal and extrathoracic soft tissues. IMPRESSION: 1. No obstructive uropathy. 2. Nonobstructing 3 mm left lower pole renal calculus. 3. Aortic atherosclerosis. Electronically Signed   By: Ulyses Jarred M.D.   On: 12/30/2016 16:14    Procedures Procedures (including critical care time)  Medications Ordered in ED Medications  ketorolac (TORADOL) 30 MG/ML injection 30 mg (not administered)  HYDROmorphone (DILAUDID) injection 1 mg (not administered)  sodium chloride 0.9 % bolus 1,000 mL (0 mLs Intravenous Stopped 12/30/16 1558)  morphine 4 MG/ML injection 4 mg (4 mg Intravenous Given 12/30/16 1438)  ondansetron (ZOFRAN) injection 4 mg (4 mg Intravenous Given 12/30/16 1438)     Initial Impression / Assessment and Plan / ED Course  I have reviewed the triage vital signs and the nursing notes.  Pertinent labs & imaging results that were available during my care of the patient were reviewed by me and considered in my medical decision making (see chart for  details).    Pain initially improved, but came back so pt was given addn pain meds.  Sx likely from pulled muscle.  Pt has pain meds to take at home.  Final Clinical Impressions(s) / ED Diagnoses   Final diagnoses:  Strain of lumbar region, initial encounter    New Prescriptions New Prescriptions   No medications on file     Isla Pence, MD 12/30/16 1659

## 2016-12-30 NOTE — ED Triage Notes (Addendum)
Pt c/o left flank pain that radiates around to the front of abdomen that started today. Pt reports the pain is worse with movement. Denies dysuria, hematuria. Reports decreased urine output. Pt reports feeling "very foggy" since Friday. Reports some nausea, denies vomiting.

## 2017-02-25 DIAGNOSIS — I10 Essential (primary) hypertension: Secondary | ICD-10-CM | POA: Diagnosis not present

## 2017-02-25 DIAGNOSIS — G4700 Insomnia, unspecified: Secondary | ICD-10-CM | POA: Diagnosis not present

## 2017-02-25 DIAGNOSIS — M797 Fibromyalgia: Secondary | ICD-10-CM | POA: Diagnosis not present

## 2017-02-25 DIAGNOSIS — J45901 Unspecified asthma with (acute) exacerbation: Secondary | ICD-10-CM | POA: Diagnosis not present

## 2017-05-01 ENCOUNTER — Other Ambulatory Visit: Payer: Self-pay | Admitting: Obstetrics and Gynecology

## 2017-05-06 ENCOUNTER — Other Ambulatory Visit: Payer: Self-pay | Admitting: Obstetrics and Gynecology

## 2017-05-07 NOTE — Telephone Encounter (Signed)
Refil phentermine x 30 pills refil x 1

## 2017-05-08 ENCOUNTER — Telehealth: Payer: Self-pay | Admitting: *Deleted

## 2017-05-08 NOTE — Telephone Encounter (Signed)
Pharmacy called regarding refill. Informed that patient could have refill x1 per Dr Johnnye Sima note on 05/06/17.

## 2017-06-07 ENCOUNTER — Other Ambulatory Visit (HOSPITAL_COMMUNITY): Payer: Self-pay | Admitting: Pulmonary Disease

## 2017-06-07 ENCOUNTER — Ambulatory Visit (HOSPITAL_COMMUNITY)
Admission: RE | Admit: 2017-06-07 | Discharge: 2017-06-07 | Disposition: A | Discharge status: Home / Self Care | Payer: PPO | Source: Ambulatory Visit | Attending: Pulmonary Disease | Admitting: Pulmonary Disease

## 2017-06-07 DIAGNOSIS — M25521 Pain in right elbow: Secondary | ICD-10-CM

## 2017-06-07 DIAGNOSIS — J45901 Unspecified asthma with (acute) exacerbation: Secondary | ICD-10-CM | POA: Diagnosis not present

## 2017-06-07 DIAGNOSIS — I10 Essential (primary) hypertension: Secondary | ICD-10-CM | POA: Diagnosis not present

## 2017-06-07 DIAGNOSIS — S59901A Unspecified injury of right elbow, initial encounter: Secondary | ICD-10-CM | POA: Diagnosis not present

## 2017-06-07 DIAGNOSIS — M797 Fibromyalgia: Secondary | ICD-10-CM | POA: Diagnosis not present

## 2017-06-07 DIAGNOSIS — Z23 Encounter for immunization: Secondary | ICD-10-CM | POA: Diagnosis not present

## 2017-06-07 DIAGNOSIS — N390 Urinary tract infection, site not specified: Secondary | ICD-10-CM | POA: Diagnosis not present

## 2017-06-20 ENCOUNTER — Encounter: Payer: Self-pay | Admitting: Obstetrics and Gynecology

## 2017-06-20 ENCOUNTER — Ambulatory Visit (INDEPENDENT_AMBULATORY_CARE_PROVIDER_SITE_OTHER): Payer: PPO | Admitting: Obstetrics and Gynecology

## 2017-06-20 ENCOUNTER — Other Ambulatory Visit (HOSPITAL_COMMUNITY)
Admission: RE | Admit: 2017-06-20 | Discharge: 2017-06-20 | Disposition: A | Discharge status: Home / Self Care | Payer: PPO | Source: Ambulatory Visit | Attending: Obstetrics and Gynecology | Admitting: Obstetrics and Gynecology

## 2017-06-20 VITALS — BP 126/74 | HR 84 | Ht 68.0 in | Wt 136.2 lb

## 2017-06-20 DIAGNOSIS — Z01419 Encounter for gynecological examination (general) (routine) without abnormal findings: Secondary | ICD-10-CM

## 2017-06-20 DIAGNOSIS — Z124 Encounter for screening for malignant neoplasm of cervix: Secondary | ICD-10-CM | POA: Diagnosis not present

## 2017-06-20 DIAGNOSIS — Z1211 Encounter for screening for malignant neoplasm of colon: Secondary | ICD-10-CM

## 2017-06-20 LAB — HEMOCCULT GUIAC POC 1CARD (OFFICE): Fecal Occult Blood, POC: NEGATIVE

## 2017-06-20 NOTE — Progress Notes (Signed)
Assessment:  Annual Gyn Exam Fibromyalgia helped by Phentermine PTSD after loss of son, stable Plan:  1. pap smear done, next pap due in 1 years 2. return annually or prn 3    Annual mammogram advised after age 60 Subjective:  [name] is a 61 y.o. female [id number] who presents for annual exam. Patient's last menstrual period was 12/13/2015. The patient has complaints today of occasional pelvic cramping. She has had her flu shot this year. She states she experiences non-specific swelling and pain during the summer. Pt has hx of Fibromylagia, and was placed on phenergan which pt responded well to it. She states that she remains active. She had her last mammogram on 02/18. About 1 month ago, she reports a whitish abnormal discharge that remained for a week then went away. She denies any difficulty with bowel movements. Pt takes iron tablets every morning.  Last pap was on 11/29/14, with LGSIL results.  The following portions of the patient's history were reviewed and updated as appropriate: allergies, current medications, past family history, past medical history, past social history, past surgical history and problem list. Past Medical History:  Diagnosis Date  . Abnormal Pap smear of cervix   . Asthma   . Fibromyalgia     Past Surgical History:  Procedure Laterality Date  . APPENDECTOMY    . BUNIONECTOMY    . COLONOSCOPY  10/08/2011   Procedure: COLONOSCOPY;  Surgeon: Dorothyann Peng, MD;  Location: [facility];  Service: Endoscopy;  Laterality: N/A;  1:45 PM  . TONSILLECTOMY    . TUBAL LIGATION       Current Outpatient Prescriptions:  .  budesonide-formoterol (SYMBICORT) 160-4.5 MCG/ACT inhaler, Inhale 2 puffs into the lungs 2 (two) times daily., Disp: , Rfl:  .  Butalbital-APAP-Caffeine 50-300-40 MG CAPS, Take 1 capsule by mouth 4 (four) times daily as needed (for migraines). For headache, Disp: , Rfl: 0 .  furosemide (LASIX) 40 MG tablet, Take 40 mg by mouth daily as  needed for edema. , Disp: , Rfl:  .  ibuprofen (ADVIL,MOTRIN) 800 MG tablet, Take 800 mg by mouth every 8 (eight) hours as needed for mild pain or moderate pain., Disp: , Rfl:  .  lubiprostone (AMITIZA) 24 MCG capsule, Take 24 mcg by mouth 2 (two) times daily. , Disp: , Rfl:  .  pantoprazole (PROTONIX) 40 MG tablet, Take 40 mg by mouth daily., Disp: , Rfl:  .  phentermine (ADIPEX-P) 37.5 MG tablet, TAKE ONE-HALF TO ONE TABLET EVERY 3 DAYSFOR FATIQUE., Disp: 30 tablet, Rfl: 1 .  temazepam (RESTORIL) 30 MG capsule, , Disp: , Rfl: 0 .  topiramate (TOPAMAX) 200 MG tablet, Take 200 mg by mouth 2 (two) times daily., Disp: , Rfl:  .  traMADol (ULTRAM) 50 MG tablet, Take 150 mg by mouth at bedtime. , Disp: , Rfl:   Review of Systems Constitutional: negative Gastrointestinal: negative Genitourinary: negative  Objective:  BP 126/74 (BP Location: Left Arm, Patient Position: Sitting, Cuff Size: Normal)   Pulse 84   Ht 5\' 8"  (1.727 m)   Wt 136 lb 3.2 oz (61.8 kg)   LMP 12/13/2015   BMI 20.71 kg/m    BMI: Body mass index is 20.71 kg/m.  General Appearance: Alert, appropriate appearance for age. No acute distress HEENT: Grossly normal Back: No CVAT Breast Exam: No masses or nodes.No dimpling, nipple retraction or discharge. Gastrointestinal: Soft, non-tender, no masses or organomegaly Pelvic Exam:  External genitalia: normal general appearance Vaginal: normal mucosa  without prolapse or lesions, normal without tenderness, induration or masses and normal rugae, well supported Cervix: normal appearance Adnexa: normal bimanual exam Uterus: normal single, nontender, tiny Rectovaginal: normal rectal, no masses and guaiac negative stool obtained Lymphatic Exam: Non-palpable nodes in neck, clavicular, axillary, or inguinal regions Skin: no rash or abnormalities Neurologic: Normal gait and speech, no tremor  Psychiatric: Alert and oriented, appropriate affect.  Urinalysis:Not done  [name].  MD Pgr [phone] 1:25 PM   By signing my name below, I, Izna Ahmed, attest that this documentation has been prepared under the direction and in the presence of [name], MD. Electronically Signed: Jabier Gauss, Medical Scribe. 06/20/17. 1:25 PM.  I personally performed the services described in this documentation, which was SCRIBED in my presence. The recorded information has been reviewed and considered accurate. It has been edited as necessary during review. [name], MD

## 2017-06-21 LAB — CYTOLOGY - PAP
Adequacy: ABSENT
Diagnosis: NEGATIVE
HPV (WINDOPATH): NOT DETECTED

## 2017-07-01 ENCOUNTER — Ambulatory Visit (INDEPENDENT_AMBULATORY_CARE_PROVIDER_SITE_OTHER): Payer: PPO | Admitting: Otolaryngology

## 2017-07-01 DIAGNOSIS — K219 Gastro-esophageal reflux disease without esophagitis: Secondary | ICD-10-CM

## 2017-07-01 DIAGNOSIS — R490 Dysphonia: Secondary | ICD-10-CM

## 2017-07-02 ENCOUNTER — Ambulatory Visit: Payer: PPO | Admitting: Orthopedic Surgery

## 2017-07-02 ENCOUNTER — Other Ambulatory Visit (INDEPENDENT_AMBULATORY_CARE_PROVIDER_SITE_OTHER): Payer: Self-pay | Admitting: Otolaryngology

## 2017-07-02 ENCOUNTER — Encounter: Payer: Self-pay | Admitting: Orthopedic Surgery

## 2017-07-02 VITALS — BP 143/91 | HR 80 | Ht 68.0 in | Wt 136.0 lb

## 2017-07-02 DIAGNOSIS — R1310 Dysphagia, unspecified: Secondary | ICD-10-CM

## 2017-07-02 DIAGNOSIS — M7711 Lateral epicondylitis, right elbow: Secondary | ICD-10-CM | POA: Diagnosis not present

## 2017-07-02 NOTE — Progress Notes (Signed)
Progress Note   Patient ID: [name], female   DOB: [birth year], 61 y.o.   MRN: [id number]  Chief Complaint  Patient presents with  . Elbow Pain    right elbow x 72months pt denies injury or repetitive motion     HPI 61 year old female with fibromyalgia presents for evaluation of her right elbow  She gives a history of atraumatic onset of right elbow pain which came on gradually.  She has severe pain at night mild constant pain dull aching during the day pain is over the right elbow and she says it goes through the elbow.  She also has some pain in her right thumb at the base of the joint  Review of Systems  Constitutional: Negative for chills, fever, malaise/fatigue and weight loss.  Musculoskeletal: Positive for joint pain and myalgias.  Skin: Negative.   Neurological: Negative for tingling.      Current Meds  Medication Sig  . budesonide-formoterol (SYMBICORT) 160-4.5 MCG/ACT inhaler Inhale 2 puffs into the lungs 2 (two) times daily.  . Butalbital-APAP-Caffeine 50-300-40 MG CAPS Take 1 capsule by mouth 4 (four) times daily as needed (for migraines). For headache  . furosemide (LASIX) 40 MG tablet Take 40 mg by mouth daily as needed for edema.   Marland Kitchen ibuprofen (ADVIL,MOTRIN) 800 MG tablet Take 800 mg by mouth every 8 (eight) hours as needed for mild pain or moderate pain.  Marland Kitchen lubiprostone (AMITIZA) 24 MCG capsule Take 24 mcg by mouth 2 (two) times daily.   . pantoprazole (PROTONIX) 40 MG tablet Take 40 mg by mouth daily.  . phentermine (ADIPEX-P) 37.5 MG tablet TAKE ONE-HALF TO ONE TABLET EVERY 3 DAYSFOR FATIQUE.  Marland Kitchen temazepam (RESTORIL) 30 MG capsule   . topiramate (TOPAMAX) 200 MG tablet Take 200 mg by mouth 2 (two) times daily.  . traMADol (ULTRAM) 50 MG tablet Take 150 mg by mouth at bedtime.     Past Medical History:  Diagnosis Date  . Abnormal Pap smear of cervix   . Asthma   . Fibromyalgia      No Known Allergies  BP (!) 143/91   Pulse 80   Ht 5\' 8"  (1.727  m)   Wt 136 lb (61.7 kg)   LMP 12/13/2015   BMI 20.68 kg/m    Physical Exam Normal grooming hygiene ectomorphic body habitus she is oriented x3 mood Pleasant affect flat gait normal Ortho Exam  Right elbow tenderness over the lateral epicondyle of the other epicondyles and olecranon were nontender no swelling full range of motion was recorded elbow was stable to varus valgus stress test motor exam normal painful grip strength painful long finger extension against resistance painful wrist extension against resistance skin no rash ulceration or laceration  Neurovascular exam was intact Medical decision-making  Imaging: X-rays were done by Dr. Luan Pulling and I was able to see those x-rays I read those x-rays as 4 views of the right elbow.  There is no evidence of fracture dislocation arthritis or soft tissue swelling  Report is included in this review please see chart for details again it was read as normal   Encounter Diagnosis  Name Primary?  . Right tennis elbow Yes   Procedure note injection for right tennis elbow   Diagnosis right tennis elbow  Anesthesia ethyl chloride was used Alcohol use is clean the skin  After we obtained verbal consent and timeout a 25-gauge needle was used to inject 40 mg of Depo-Medrol and 3 cc of 1% lidocaine just  distal to the insertion of the ECRB  There were no complications and a sterile bandage was applied.   I gave her a super 7 exercise pack ice instructions she should improve over the next 6 weeks if not we will happily see her again  Arther Abbott, MD 07/02/2017 11:09 AM

## 2017-07-02 NOTE — Patient Instructions (Addendum)
Ice x 14 days 3 times a day    Tennis Elbow Tennis elbow (lateral epicondylitis) is inflammation of the outer tendons of your forearm close to your elbow. Your tendons attach your muscles to your bones. The outer tendons of your forearm are used to extend your wrist, and they attach on the outside part of your elbow. Tennis elbow is often found in people who play tennis, but anyone may get the condition from repeatedly extending the wrist or turning the forearm. What are the causes? This condition is caused by repeatedly extending your wrist and using your hands. It can result from sports or work that requires repetitive forearm movements. Tennis elbow may also be caused by an injury. What increases the risk? You have a higher risk of developing tennis elbow if you play tennis or another racquet sport. You also have a higher risk if you frequently use your hands for work. This condition is also more likely to develop in:  Musicians.  Carpenters, painters, and plumbers.  Cooks.  Cashiers.  People who work in [employer].  Architect workers.  Butchers.  People who use computers.  What are the signs or symptoms? Symptoms of this condition include:  Pain and tenderness in your forearm and the outer part of your elbow. You may only feel the pain when you use your arm, or you may feel it even when you are not using your arm.  A burning feeling that runs from your elbow through your arm.  Weak grip in your hands.  How is this diagnosed? This condition may be diagnosed by medical history and physical exam. You may also have other tests, including:  X-rays.  MRI.  How is this treated? Your health care provider will recommend lifestyle adjustments, such as resting and icing your arm. Treatment may also include:  Medicines for inflammation. This may include shots of cortisone if your pain continues.  Physical therapy. This may include massage or exercises.  An elbow  brace.  Surgery may eventually be recommended if your pain does not go away with treatment. Follow these instructions at home: Activity  Rest your elbow and wrist as directed by your health care provider. Try to avoid any activities that caused the problem until your health care provider says that you can do them again.  If a physical therapist teaches you exercises, do all of them as directed.  If you lift an object, lift it with your palm facing upward. This lowers the stress on your elbow. Lifestyle  If your tennis elbow is caused by sports, check your equipment and make sure that: ? You are using it correctly. ? It is the best fit for you.  If your tennis elbow is caused by work, take breaks frequently, if you are able. Talk with your manager about how to best perform tasks in a way that is safe. ? If your tennis elbow is caused by computer use, talk with your manager about any changes that can be made to your work environment. General instructions  If directed, apply ice to the painful area: ? Put ice in a plastic bag. ? Place a towel between your skin and the bag. ? Leave the ice on for 20 minutes, 2-3 times per day.  Take medicines only as directed by your health care provider.  If you were given a brace, wear it as directed by your health care provider.  Keep all follow-up visits as directed by your health care provider. This is important.  Contact a health care provider if:  Your pain does not get better with treatment.  Your pain gets worse.  You have numbness or weakness in your forearm, hand, or fingers. This information is not intended to replace advice given to you by your health care provider. Make sure you discuss any questions you have with your health care provider. Document Released: 08/06/2005 Document Revised: 04/05/2016 Document Reviewed: 08/02/2014 Elsevier Interactive Patient Education  [employer].

## 2017-07-05 ENCOUNTER — Telehealth: Payer: Self-pay | Admitting: *Deleted

## 2017-07-05 NOTE — Telephone Encounter (Signed)
Informed pt that PAP results were normal per Dr Maretta Bees request.

## 2017-07-07 DIAGNOSIS — Z79899 Other long term (current) drug therapy: Secondary | ICD-10-CM | POA: Diagnosis not present

## 2017-07-07 DIAGNOSIS — J45909 Unspecified asthma, uncomplicated: Secondary | ICD-10-CM | POA: Diagnosis not present

## 2017-07-07 DIAGNOSIS — F419 Anxiety disorder, unspecified: Secondary | ICD-10-CM | POA: Diagnosis not present

## 2017-07-07 DIAGNOSIS — R064 Hyperventilation: Secondary | ICD-10-CM | POA: Diagnosis not present

## 2017-07-07 DIAGNOSIS — K219 Gastro-esophageal reflux disease without esophagitis: Secondary | ICD-10-CM | POA: Diagnosis not present

## 2017-07-07 DIAGNOSIS — F458 Other somatoform disorders: Secondary | ICD-10-CM | POA: Diagnosis not present

## 2017-07-07 DIAGNOSIS — M797 Fibromyalgia: Secondary | ICD-10-CM | POA: Diagnosis not present

## 2017-07-07 DIAGNOSIS — R410 Disorientation, unspecified: Secondary | ICD-10-CM | POA: Diagnosis not present

## 2017-07-07 DIAGNOSIS — I10 Essential (primary) hypertension: Secondary | ICD-10-CM | POA: Diagnosis not present

## 2017-07-07 DIAGNOSIS — T783XXA Angioneurotic edema, initial encounter: Secondary | ICD-10-CM | POA: Diagnosis not present

## 2017-07-08 ENCOUNTER — Ambulatory Visit (HOSPITAL_COMMUNITY)
Admission: RE | Admit: 2017-07-08 | Discharge: 2017-07-08 | Disposition: A | Discharge status: Home / Self Care | Payer: PPO | Source: Ambulatory Visit | Attending: Otolaryngology | Admitting: Otolaryngology

## 2017-07-08 DIAGNOSIS — R1310 Dysphagia, unspecified: Secondary | ICD-10-CM | POA: Insufficient documentation

## 2017-07-30 DIAGNOSIS — R079 Chest pain, unspecified: Secondary | ICD-10-CM | POA: Diagnosis not present

## 2017-07-30 DIAGNOSIS — I10 Essential (primary) hypertension: Secondary | ICD-10-CM | POA: Diagnosis not present

## 2017-07-30 DIAGNOSIS — T781XXD Other adverse food reactions, not elsewhere classified, subsequent encounter: Secondary | ICD-10-CM | POA: Diagnosis not present

## 2017-07-30 DIAGNOSIS — J45901 Unspecified asthma with (acute) exacerbation: Secondary | ICD-10-CM | POA: Diagnosis not present

## 2017-07-31 DIAGNOSIS — J45901 Unspecified asthma with (acute) exacerbation: Secondary | ICD-10-CM | POA: Diagnosis not present

## 2017-08-01 DIAGNOSIS — J45901 Unspecified asthma with (acute) exacerbation: Secondary | ICD-10-CM | POA: Diagnosis not present

## 2017-08-02 DIAGNOSIS — J45901 Unspecified asthma with (acute) exacerbation: Secondary | ICD-10-CM | POA: Diagnosis not present

## 2017-08-06 DIAGNOSIS — J45901 Unspecified asthma with (acute) exacerbation: Secondary | ICD-10-CM | POA: Diagnosis not present

## 2017-08-28 ENCOUNTER — Other Ambulatory Visit: Payer: Self-pay | Admitting: Obstetrics and Gynecology

## 2017-08-28 DIAGNOSIS — Z1231 Encounter for screening mammogram for malignant neoplasm of breast: Secondary | ICD-10-CM

## 2017-09-16 ENCOUNTER — Ambulatory Visit (INDEPENDENT_AMBULATORY_CARE_PROVIDER_SITE_OTHER): Payer: PPO | Admitting: Otolaryngology

## 2017-09-16 ENCOUNTER — Encounter: Payer: Self-pay | Admitting: Allergy and Immunology

## 2017-09-16 ENCOUNTER — Ambulatory Visit (INDEPENDENT_AMBULATORY_CARE_PROVIDER_SITE_OTHER): Payer: PPO | Admitting: Allergy and Immunology

## 2017-09-16 VITALS — BP 122/82 | HR 69 | Ht 68.0 in | Wt 137.8 lb

## 2017-09-16 DIAGNOSIS — J4540 Moderate persistent asthma, uncomplicated: Secondary | ICD-10-CM | POA: Diagnosis not present

## 2017-09-16 DIAGNOSIS — K219 Gastro-esophageal reflux disease without esophagitis: Secondary | ICD-10-CM | POA: Diagnosis not present

## 2017-09-16 DIAGNOSIS — T783XXA Angioneurotic edema, initial encounter: Secondary | ICD-10-CM | POA: Insufficient documentation

## 2017-09-16 DIAGNOSIS — T783XXD Angioneurotic edema, subsequent encounter: Secondary | ICD-10-CM

## 2017-09-16 DIAGNOSIS — T7840XD Allergy, unspecified, subsequent encounter: Secondary | ICD-10-CM | POA: Diagnosis not present

## 2017-09-16 DIAGNOSIS — T7840XA Allergy, unspecified, initial encounter: Secondary | ICD-10-CM | POA: Insufficient documentation

## 2017-09-16 DIAGNOSIS — R070 Pain in throat: Secondary | ICD-10-CM

## 2017-09-16 DIAGNOSIS — J3089 Other allergic rhinitis: Secondary | ICD-10-CM | POA: Diagnosis not present

## 2017-09-16 DIAGNOSIS — L509 Urticaria, unspecified: Secondary | ICD-10-CM | POA: Insufficient documentation

## 2017-09-16 DIAGNOSIS — L500 Allergic urticaria: Secondary | ICD-10-CM | POA: Diagnosis not present

## 2017-09-16 MED ORDER — FLUTICASONE PROPIONATE 50 MCG/ACT NA SUSP
1.0000 | Freq: Two times a day (BID) | NASAL | 5 refills | Status: DC | PRN
Start: 2017-09-16 — End: 2018-06-09

## 2017-09-16 MED ORDER — EPINEPHRINE 0.3 MG/0.3ML IJ SOAJ: 0.3000 mg | Freq: Once | INTRAMUSCULAR | 2 refills | Status: AC

## 2017-09-16 NOTE — Assessment & Plan Note (Signed)
The patients history suggests allergic reaction with an unclear trigger. Food allergen skin tests were negative today despite a positive histamine control. The negative predictive value for skin tests is excellent (greater than 95%). We will proceed with in vitro lab studies to help establish an etiology.  The following labs have been ordered: FCeRI antibody, anti-thyroglobulin antibody, thyroid peroxidase antibody, baseline serum tryptase, C4 level, CBC, CMP, ESR, ANA, and serum specific IgE against galactose-alpha-1,3-galactose panel.   Should symptoms recur, a journal is to be kept recording any foods eaten, beverages consumed, medications taken within a 6 hour period prior to the onset of symptoms, as well as activities performed, and environmental conditions. For any symptoms concerning for anaphylaxis, epinephrine is to be administered and 911 is to be called immediately.  A prescription has been provided for epinephrine autoinjector 2 pack along with instructions for its proper administration.

## 2017-09-16 NOTE — Assessment & Plan Note (Signed)
Well-controlled on current treatment.  Continue Symbicort 160-4.5 g, 2 inhalations twice daily, and albuterol HFA, 1-2 inhalations every 6 hours if needed and 15 minutes prior to vigorous exercise.  To maximize pulmonary deposition, a spacer has been provided along with instructions for its proper administration with an HFA inhaler.  Subjective and objective measures of pulmonary function will be followed and the treatment plan will be adjusted accordingly.

## 2017-09-16 NOTE — Patient Instructions (Addendum)
Allergic reaction The patients history suggests allergic reaction with an unclear trigger. Food allergen skin tests were negative today despite a positive histamine control. The negative predictive value for skin tests is excellent (greater than 95%). We will proceed with in vitro lab studies to help establish an etiology.  The following labs have been ordered: FCeRI antibody, anti-thyroglobulin antibody, thyroid peroxidase antibody, baseline serum tryptase, C4 level, CBC, CMP, ESR, ANA, and serum specific IgE against galactose-alpha-1,3-galactose panel.   Should symptoms recur, a journal is to be kept recording any foods eaten, beverages consumed, medications taken within a 6 hour period prior to the onset of symptoms, as well as activities performed, and environmental conditions. For any symptoms concerning for anaphylaxis, epinephrine is to be administered and 911 is to be called immediately.  A prescription has been provided for epinephrine autoinjector 2 pack along with instructions for its proper administration.  Moderate persistent asthma Well-controlled on current treatment.  Continue Symbicort 160-4.5 g, 2 inhalations twice daily, and albuterol HFA, 1-2 inhalations every 6 hours if needed and 15 minutes prior to vigorous exercise.  To maximize pulmonary deposition, a spacer has been provided along with instructions for its proper administration with an HFA inhaler.  Subjective and objective measures of pulmonary function will be followed and the treatment plan will be adjusted accordingly.  Allergic rhinitis  Aeroallergen avoidance measures have been discussed and provided in written form.  A prescription has been provided for fluticasone nasal spray, one spray per nostril 1-2 times daily as needed. Proper nasal spray technique has been discussed and demonstrated.  Nasal saline lavage (NeilMed) has been recommended as needed and prior to medicated nasal sprays along with  instructions for proper administration.  For thick post nasal drainage, add guaifenesin [phone] mg (Mucinex)  twice daily as needed with adequate hydration as discussed.   When lab results have returned the patient will be called with further recommendations and follow up instructions.

## 2017-09-16 NOTE — Assessment & Plan Note (Addendum)
   Aeroallergen avoidance measures have been discussed and provided in written form.  A prescription has been provided for fluticasone nasal spray, one spray per nostril 1-2 times daily as needed. Proper nasal spray technique has been discussed and demonstrated.  Nasal saline lavage (NeilMed) has been recommended as needed and prior to medicated nasal sprays along with instructions for proper administration.  For thick post nasal drainage, add guaifenesin [phone] mg (Mucinex)  twice daily as needed with adequate hydration as discussed.

## 2017-09-16 NOTE — Progress Notes (Signed)
New Patient Note  RE: [name] MRN: [id number] DOB: Apr 22, 1956 Date of Office Visit: 09/16/2017  Referring provider: Sinda Du, MD Primary care provider: Sinda Du, MD  Chief Complaint: Allergic Reaction; Angioedema; and Asthma   History of present illness: [name] is a 62 y.o. female seen today in consultation requested by Sinda Du, MD.  She reports that on July 07, 2017 she consumed a [employer] salad, containing lettuce, chicken, bacon, apples, cranberries, feta cheese, and pomegranate salad dressing.  She had consumed this salad on 5 or 6 occasions previously, however on this occasion within minutes she developed pruritus of the hands and feet, urticarial lesions over her abdomen and torso, tongue swelling, and dizziness.  She did not have any diphenhydramine on hand and did not think to use the epinephrine autoinjector that she had access to.  She called her son who took her to the emergency department but states that in one point she could not even remember her own name.  Several years ago she developed tongue swelling requiring evaluation and treatment in the emergency department.  No trigger was identified.  She reports that on occasion she developes a strange smell in her left nostril accompanied by numbness of the left side of her face, left arm, left hand, and occasionally the left leg.  She states that it feels like these parts of her body have "fallen asleep".  These episodes typically last for 30-60 seconds at a time.  She has not been able to identify any specific triggers. [name] was diagnosed with asthma approximately 40 years ago.  The asthma is currently well controlled with Symbicort 160-4.5 g, 2 inhalations twice daily.  While on this regimen, she rarely requires albuterol rescue and denies nocturnal awakenings due to lower respiratory symptoms.  She currently does not use a spacer device with her HFA inhalers. [name] also  complains of persistent throat clearing.  She has tried a proton pump inhibitor without perceived benefit.  She is uncertain if this problem is being caused by thick postnasal drainage.  No significant seasonal symptom variation has been noted nor have specific environmental triggers been identified.   Assessment and plan: Allergic reaction The patients history suggests allergic reaction with an unclear trigger. Food allergen skin tests were negative today despite a positive histamine control. The negative predictive value for skin tests is excellent (greater than 95%). We will proceed with in vitro lab studies to help establish an etiology.  The following labs have been ordered: FCeRI antibody, anti-thyroglobulin antibody, thyroid peroxidase antibody, baseline serum tryptase, C4 level, CBC, CMP, ESR, ANA, and serum specific IgE against galactose-alpha-1,3-galactose panel.   Should symptoms recur, a journal is to be kept recording any foods eaten, beverages consumed, medications taken within a 6 hour period prior to the onset of symptoms, as well as activities performed, and environmental conditions. For any symptoms concerning for anaphylaxis, epinephrine is to be administered and 911 is to be called immediately.  A prescription has been provided for epinephrine autoinjector 2 pack along with instructions for its proper administration.  Moderate persistent asthma Well-controlled on current treatment.  Continue Symbicort 160-4.5 g, 2 inhalations twice daily, and albuterol HFA, 1-2 inhalations every 6 hours if needed and 15 minutes prior to vigorous exercise.  To maximize pulmonary deposition, a spacer has been provided along with instructions for its proper administration with an HFA inhaler.  Subjective and objective measures of pulmonary function will be followed and the treatment plan will  be adjusted accordingly.  Allergic rhinitis  Aeroallergen avoidance measures have been discussed  and provided in written form.  A prescription has been provided for fluticasone nasal spray, one spray per nostril 1-2 times daily as needed. Proper nasal spray technique has been discussed and demonstrated.  Nasal saline lavage (NeilMed) has been recommended as needed and prior to medicated nasal sprays along with instructions for proper administration.  For thick post nasal drainage, add guaifenesin [phone] mg (Mucinex)  twice daily as needed with adequate hydration as discussed.   Meds ordered this encounter  Medications  . fluticasone (FLONASE) 50 MCG/ACT nasal spray    Sig: Place 1 spray into both nostrils 2 (two) times daily as needed for allergies or rhinitis.    Dispense:  16 g    Refill:  5  . EPINEPHrine 0.3 mg/0.3 mL IJ SOAJ injection    Sig: Inject 0.3 mLs (0.3 mg total) into the muscle once for 1 dose.    Dispense:  0.3 mL    Refill:  2    Diagnostics: Spirometry:  Normal with an FEV1 of 108% predicted.  Please see scanned spirometry results for details. Environmental skin testing: Positive to grass pollen and cat hair. Food allergen skin testing: Negative despite a positive histamine control.    Physical examination: Blood pressure 122/82, pulse 69, height 5' 8"  (1.727 m), weight 137 lb 12.8 oz (62.5 kg), last menstrual period 12/13/2015, SpO2 99 %.  General: Alert, interactive, in no acute distress. HEENT: TMs pearly gray, turbinates moderately edematous with clear discharge, post-pharynx erythematous. Neck: Supple without lymphadenopathy. Lungs: Clear to auscultation without wheezing, rhonchi or rales. CV: Normal S1, S2 without murmurs. Abdomen: Nondistended, nontender. Skin: Warm and dry, without lesions or rashes. Extremities:  No clubbing, cyanosis or edema. Neuro:   Grossly intact.  Review of systems:  Review of systems negative except as noted in HPI / PMHx or noted below: Review of Systems  Constitutional: Negative.   HENT: Negative.   Eyes:  Negative.   Respiratory: Negative.   Cardiovascular: Negative.   Gastrointestinal: Negative.   Genitourinary: Negative.   Musculoskeletal: Negative.   Skin: Negative.   Neurological: Negative.   Endo/Heme/Allergies: Negative.   Psychiatric/Behavioral: Negative.     Past medical history:  Past Medical History:  Diagnosis Date  . Abnormal Pap smear of cervix   . Asthma   . Fibromyalgia     Past surgical history:  Past Surgical History:  Procedure Laterality Date  . ADENOIDECTOMY    . APPENDECTOMY    . BUNIONECTOMY    . COLONOSCOPY  10/08/2011   Procedure: COLONOSCOPY;  Surgeon: Dorothyann Peng, MD;  Location: [facility];  Service: Endoscopy;  Laterality: N/A;  1:45 PM  . TONSILLECTOMY    . TUBAL LIGATION      Family history: Family History  Problem Relation Age of Onset  . Hypertension Mother   . Aneurysm Father   . Hypertension Father   . Cancer Brother   . COPD Brother   . Angioedema Neg Hx   . Atopy Neg Hx   . Eczema Neg Hx   . Immunodeficiency Neg Hx   . Urticaria Neg Hx   . Asthma Neg Hx   . Allergic rhinitis Neg Hx     Social history: Social History   Socioeconomic History  . Marital status: Married    Spouse name: Not on file  . Number of children: Not on file  . Years of education: Not on file  .  Highest education level: Not on file  Social Needs  . Financial resource strain: Not on file  . Food insecurity - worry: Not on file  . Food insecurity - inability: Not on file  . Transportation needs - medical: Not on file  . Transportation needs - non-medical: Not on file  Occupational History  . Not on file  Tobacco Use  . Smoking status: Former Smoker    Types: Cigarettes  . Smokeless tobacco: Never Used  Substance and Sexual Activity  . Alcohol use: No  . Drug use: No  . Sexual activity: Yes  Other Topics Concern  . Not on file  Social History Narrative  . Not on file   Environmental History: The patient lives in a 61 year old house  with carpeting throughout and central air/heat.  She is a non-smoker without pets.  There is no known mold/water damage in the home.  Allergies as of 09/16/2017   No Known Allergies     Medication List        Accurate as of 09/16/17  1:07 PM. Always use your most recent med list.          ALPRAZolam 1 MG tablet Commonly known as:  XANAX   amLODipine 5 MG tablet Commonly known as:  NORVASC Take 5 mg by mouth daily.   budesonide-formoterol 160-4.5 MCG/ACT inhaler Commonly known as:  SYMBICORT Inhale 2 puffs into the lungs 2 (two) times daily.   Butalbital-APAP-Caffeine 50-300-40 MG Caps Take 1 capsule by mouth 4 (four) times daily as needed (for migraines). For headache   [facility] ALLERGY 25 MG tablet Generic drug:  diphenhydrAMINE TAKE 2 TABLETS BY MOUTH THREE TIMES DAILY AS NEEDED FOR ITCHING   EPINEPHrine 0.3 mg/0.3 mL Soaj injection Commonly known as:  EPI-PEN Inject 0.3 mLs (0.3 mg total) into the muscle once for 1 dose.   fluticasone 50 MCG/ACT nasal spray Commonly known as:  FLONASE Place 1 spray into both nostrils 2 (two) times daily as needed for allergies or rhinitis.   furosemide 40 MG tablet Commonly known as:  LASIX Take 40 mg by mouth daily as needed for edema.   ibuprofen 800 MG tablet Commonly known as:  ADVIL,MOTRIN Take 800 mg by mouth every 8 (eight) hours as needed for mild pain or moderate pain.   lubiprostone 24 MCG capsule Commonly known as:  AMITIZA Take 24 mcg by mouth 2 (two) times daily.   MUCINEX 600 MG 12 hr tablet Generic drug:  guaiFENesin   pantoprazole 40 MG tablet Commonly known as:  PROTONIX Take 40 mg by mouth daily.   PROAIR HFA 108 (90 Base) MCG/ACT inhaler Generic drug:  albuterol inhale 2 puffs by mouth four times a day if needed   temazepam 30 MG capsule Commonly known as:  RESTORIL   topiramate 200 MG tablet Commonly known as:  TOPAMAX Take 200 mg by mouth 2 (two) times daily.   traMADol 50 MG tablet Commonly  known as:  ULTRAM Take 150 mg by mouth at bedtime.       Known medication allergies: No Known Allergies  I appreciate the opportunity to take part in [name]'s care. Please do not hesitate to contact me with questions.  Sincerely,   R. Edgar Frisk, MD

## 2017-09-22 LAB — CBC WITH DIFFERENTIAL
Basophils Absolute: 0.1 10*3/uL (ref 0.0–0.2)
Basos: 2 %
EOS (ABSOLUTE): 0.2 10*3/uL (ref 0.0–0.4)
Eos: 2 %
Hematocrit: 41.9 % (ref 34.0–46.6)
Hemoglobin: 13.2 g/dL (ref 11.1–15.9)
Immature Grans (Abs): 0 10*3/uL (ref 0.0–0.1)
Immature Granulocytes: 0 %
Lymphocytes Absolute: 1 10*3/uL (ref 0.7–3.1)
Lymphs: 15 %
MCH: 28.6 pg (ref 26.6–33.0)
MCHC: 31.5 g/dL (ref 31.5–35.7)
MCV: 91 fL (ref 79–97)
Monocytes Absolute: 0.6 10*3/uL (ref 0.1–0.9)
Monocytes: 8 %
Neutrophils Absolute: 4.8 10*3/uL (ref 1.4–7.0)
Neutrophils: 73 %
RBC: 4.62 x10E6/uL (ref 3.77–5.28)
RDW: 15 % (ref 12.3–15.4)
WBC: 6.6 10*3/uL (ref 3.4–10.8)

## 2017-09-22 LAB — ANA: Anti Nuclear Antibody(ANA): NEGATIVE

## 2017-09-22 LAB — THYROGLOBULIN LEVEL: Thyroglobulin (TG-RIA): 2.7 ng/mL

## 2017-09-22 LAB — SEDIMENTATION RATE: Sed Rate: 5 mm/hr (ref 0–40)

## 2017-09-22 LAB — C4 COMPLEMENT: Complement C4, Serum: 26 mg/dL (ref 14–44)

## 2017-09-22 LAB — TRYPTASE: Tryptase: 4.2 ug/L (ref 2.2–13.2)

## 2017-09-22 LAB — ALPHA GAL IGE: Alpha Gal IgE*: <0.1 kU/L (ref <0.10)

## 2017-09-22 LAB — THYROID PEROXIDASE ANTIBODY: Thyroperoxidase Ab SerPl-aCnc: 8 IU/mL (ref 0–34)

## 2017-09-22 LAB — TSH: TSH: 1.44 u[IU]/mL (ref 0.450–4.500)

## 2017-09-22 LAB — CHRONIC URTICARIA: cu index: 2.1 (ref <10)

## 2017-10-04 ENCOUNTER — Ambulatory Visit (HOSPITAL_COMMUNITY): Payer: PPO

## 2017-10-04 ENCOUNTER — Ambulatory Visit (HOSPITAL_COMMUNITY)
Admission: RE | Admit: 2017-10-04 | Discharge: 2017-10-04 | Disposition: A | Discharge status: Home / Self Care | Payer: PPO | Source: Ambulatory Visit | Attending: Obstetrics and Gynecology | Admitting: Obstetrics and Gynecology

## 2017-10-04 DIAGNOSIS — Z1231 Encounter for screening mammogram for malignant neoplasm of breast: Secondary | ICD-10-CM | POA: Diagnosis not present

## 2017-10-08 ENCOUNTER — Telehealth: Payer: Self-pay | Admitting: *Deleted

## 2017-10-08 NOTE — Telephone Encounter (Signed)
Informed patient mammogram was normal. Pt verbalized gratitude.

## 2017-10-24 DIAGNOSIS — M797 Fibromyalgia: Secondary | ICD-10-CM | POA: Diagnosis not present

## 2017-10-24 DIAGNOSIS — I10 Essential (primary) hypertension: Secondary | ICD-10-CM | POA: Diagnosis not present

## 2017-10-24 DIAGNOSIS — M5431 Sciatica, right side: Secondary | ICD-10-CM | POA: Diagnosis not present

## 2017-10-24 DIAGNOSIS — J45909 Unspecified asthma, uncomplicated: Secondary | ICD-10-CM | POA: Diagnosis not present

## 2017-11-05 ENCOUNTER — Encounter: Payer: Self-pay | Admitting: Adult Health

## 2017-11-05 ENCOUNTER — Ambulatory Visit (INDEPENDENT_AMBULATORY_CARE_PROVIDER_SITE_OTHER): Payer: PPO | Admitting: Adult Health

## 2017-11-05 VITALS — BP 140/90 | HR 83 | Ht 68.0 in | Wt 142.5 lb

## 2017-11-05 DIAGNOSIS — R232 Flushing: Secondary | ICD-10-CM | POA: Diagnosis not present

## 2017-11-05 MED ORDER — PAROXETINE HCL 10 MG PO TABS: 10.0000 mg | ORAL_TABLET | Freq: Every day | ORAL | 6 refills | Status: DC

## 2017-11-05 NOTE — Progress Notes (Signed)
Subjective:     Patient ID: [name], female   DOB: Jan 16, 1956, 62 y.o.   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 She has seen ENT and allergist lately.Has had some eye issues and is seeing eye doctor soon,she is currently doing home health. PCP Is Dr Luan Pulling.   Review of Systems +hot flashes Not sleeping well Brain fog Not sexually active  Reviewed past medical,surgical, social and family history. Reviewed medications and allergies.     Objective:   Physical Exam BP 140/90 (BP Location: Left Arm, Patient Position: Sitting, Cuff Size: Normal)   Pulse 83   Ht 5\' 8"  (1.727 m)   Wt 142 lb 8 oz (64.6 kg)   LMP 12/13/2015   BMI 21.67 kg/m  Skin warm and dry. Lungs: clear to ausculation bilaterally. Cardiovascular: regular rate and rhythm.   Will try paxil 10 mg 1 daily, because she can't take HRT due to stroke in 2005.  Assessment:     1. Hot flashes       Plan:     Meds ordered this encounter  Medications  . PARoxetine (PAXIL) 10 MG tablet    Sig: Take 1 tablet (10 mg total) by mouth daily.    Dispense:  30 tablet    Refill:  6    Order Specific Question:   Supervising Provider    Answer:   Tania Ade H [[id number]]  F/U in 6 weeks Review handout on menopause

## 2017-11-05 NOTE — Patient Instructions (Signed)
Menopause Menopause is the normal time of life when menstrual periods stop completely. Menopause is complete when you have missed 12 consecutive menstrual periods. It usually occurs between the ages of 48 years and 55 years. Very rarely does a woman develop menopause before the age of 40 years. At menopause, your ovaries stop producing the female hormones estrogen and progesterone. This can cause undesirable symptoms and also affect your health. Sometimes the symptoms may occur 4-5 years before the menopause begins. There is no relationship between menopause and:  Oral contraceptives.  Number of children you had.  Race.  The age your menstrual periods started (menarche).  Heavy smokers and very thin women may develop menopause earlier in life. What are the causes?  The ovaries stop producing the female hormones estrogen and progesterone. Other causes include:  Surgery to remove both ovaries.  The ovaries stop functioning for no known reason.  Tumors of the pituitary gland in the brain.  Medical disease that affects the ovaries and hormone production.  Radiation treatment to the abdomen or pelvis.  Chemotherapy that affects the ovaries.  What are the signs or symptoms?  Hot flashes.  Night sweats.  Decrease in sex drive.  Vaginal dryness and thinning of the vagina causing painful intercourse.  Dryness of the skin and developing wrinkles.  Headaches.  Tiredness.  Irritability.  Memory problems.  Weight gain.  Bladder infections.  Hair growth of the face and chest.  Infertility. More serious symptoms include:  Loss of bone (osteoporosis) causing breaks (fractures).  Depression.  Hardening and narrowing of the arteries (atherosclerosis) causing heart attacks and strokes.  How is this diagnosed?  When the menstrual periods have stopped for 12 straight months.  Physical exam.  Hormone studies of the blood. How is this treated? There are many treatment  choices and nearly as many questions about them. The decisions to treat or not to treat menopausal changes is an individual choice made with your health care provider. Your health care provider can discuss the treatments with you. Together, you can decide which treatment will work best for you. Your treatment choices may include:  Hormone therapy (estrogen and progesterone).  Non-hormonal medicines.  Treating the individual symptoms with medicine (for example antidepressants for depression).  Herbal medicines that may help specific symptoms.  Counseling by a psychiatrist or psychologist.  Group therapy.  Lifestyle changes including: ? Eating healthy. ? Regular exercise. ? Limiting caffeine and alcohol. ? Stress management and meditation.  No treatment.  Follow these instructions at home:  Take the medicine your health care provider gives you as directed.  Get plenty of sleep and rest.  Exercise regularly.  Eat a diet that contains calcium (good for the bones) and soy products (acts like estrogen hormone).  Avoid alcoholic beverages.  Do not smoke.  If you have hot flashes, dress in layers.  Take supplements, calcium, and vitamin D to strengthen bones.  You can use over-the-counter lubricants or moisturizers for vaginal dryness.  Group therapy is sometimes very helpful.  Acupuncture may be helpful in some cases. Contact a health care provider if:  You are not sure you are in menopause.  You are having menopausal symptoms and need advice and treatment.  You are still having menstrual periods after age 55 years.  You have pain with intercourse.  Menopause is complete (no menstrual period for 12 months) and you develop vaginal bleeding.  You need a referral to a specialist (gynecologist, psychiatrist, or psychologist) for treatment. Get help right   away if:  You have severe depression.  You have excessive vaginal bleeding.  You fell and think you have a  broken bone.  You have pain when you urinate.  You develop leg or chest pain.  You have a fast pounding heart beat (palpitations).  You have severe headaches.  You develop vision problems.  You feel a lump in your breast.  You have abdominal pain or severe indigestion. This information is not intended to replace advice given to you by your health care provider. Make sure you discuss any questions you have with your health care provider. Document Released: 10/27/2003 Document Revised: 01/12/2016 Document Reviewed: 03/05/2013 Elsevier Interactive Patient Education  2017 [employer]  

## 2017-11-07 ENCOUNTER — Ambulatory Visit (HOSPITAL_COMMUNITY): Payer: PPO | Attending: Pulmonary Disease

## 2017-11-07 ENCOUNTER — Encounter (HOSPITAL_COMMUNITY): Payer: Self-pay

## 2017-11-07 DIAGNOSIS — R29898 Other symptoms and signs involving the musculoskeletal system: Secondary | ICD-10-CM | POA: Diagnosis not present

## 2017-11-07 DIAGNOSIS — M5441 Lumbago with sciatica, right side: Secondary | ICD-10-CM | POA: Diagnosis not present

## 2017-11-07 DIAGNOSIS — M542 Cervicalgia: Secondary | ICD-10-CM | POA: Insufficient documentation

## 2017-11-07 DIAGNOSIS — G8929 Other chronic pain: Secondary | ICD-10-CM | POA: Diagnosis not present

## 2017-11-07 DIAGNOSIS — R293 Abnormal posture: Secondary | ICD-10-CM | POA: Diagnosis not present

## 2017-11-07 NOTE — Therapy (Signed)
[place], [place], [address] Phone: [phone]   Fax:  [phone]  Physical Therapy Evaluation  Patient Details  Name: [name] MRN: [id number] Date of Birth: 08-04-1956 Referring Provider: Sinda Du, MD   Encounter Date: 11/07/2017  PT End of Session - 11/07/17 1141    Visit Number  1    Number of Visits  9    Date for PT Re-Evaluation  12/05/17    Authorization Type  [employer]    PT Start Time  1035    PT Stop Time  1110    PT Time Calculation (min)  35 min    Activity Tolerance  Patient tolerated treatment well;No increased pain    Behavior During Therapy  WFL for tasks assessed/performed       Past Medical History:  Diagnosis Date  . Abnormal Pap smear of cervix   . Asthma   . Fibromyalgia     Past Surgical History:  Procedure Laterality Date  . ADENOIDECTOMY    . APPENDECTOMY    . BUNIONECTOMY    . COLONOSCOPY  10/08/2011   Procedure: COLONOSCOPY;  Surgeon: Dorothyann Peng, MD;  [facility];  Service: Endoscopy;  Laterality: N/A;  1:45 PM  . TONSILLECTOMY    . TUBAL LIGATION      There were no vitals filed for this visit.   Subjective Assessment - 11/07/17 1040    Subjective  Pt states that she has been having neck and back pain for years. She states that her neck is worse than her lower back; she states that her R sciatic nerve will bother her some but if she IR and stretches her hip it will get better most of the time. She reports that she has a point on the L side of her neck that hurts her. 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 She reports having fibromyalgia and has previous h/o attending therapy which was successful. She has the most difficulty performing work duties due to this pain. Her pain has worsened over the last 2 months.    Pertinent History  fibromyalgia, h/o R eye stroke (limited vision)    Limitations   Lifting    How long can you sit comfortably?  tries not to sit    How long can you stand comfortably?  varies    How long can you walk comfortably?  no issues    Patient Stated Goals  to get better    Currently in Pain?  Yes    Pain Score  7     Pain Location  Neck    Pain Orientation  Left    Pain Descriptors / Indicators  Aching;Dull    Pain Type  Chronic pain    Pain Onset  More than a month ago    Pain Frequency  Constant    Aggravating Factors   movement    Pain Relieving Factors  rest    Effect of Pain on Daily Activities  keeps going         [facility] PT Assessment - 11/07/17 0001      Assessment   Medical Diagnosis  neck and back pain    Referring Provider  Sinda Du, MD    Onset Date/Surgical Date  -- years ago; recent flare up 2 months ago    Next MD Visit  3 months    Prior Therapy  yes  for same issue      Balance Screen   Has the patient fallen in the past 6 months  No    Has the patient had a decrease in activity level because of a fear of falling?   No    Is the patient reluctant to leave their home because of a fear of falling?   No      Prior Function   Level of Independence  Independent    Vocation  Part time employment    Vocation Requirements  lifting, pulling patient    Leisure  play with grandkids      Observation/Other Assessments   Focus on Therapeutic Outcomes (FOTO)   53% limitation      Sensation   Light Touch  Appears Intact      ROM / Strength   AROM / PROM / Strength  AROM;Strength      AROM   AROM Assessment Site  Cervical    Cervical Flexion  44    Cervical Extension  22    Cervical - Right Side Bend  14    Cervical - Left Side Bend  21    Cervical - Right Rotation  67    Cervical - Left Rotation  54      Strength   Overall Strength Comments  L middle and lower trap: 3/5    Strength Assessment Site  Shoulder;Elbow;Wrist;Hand    Right Shoulder Flexion  4+/5    Right Shoulder ABduction  4+/5    Right Shoulder Internal  Rotation  5/5    Right Shoulder External Rotation  5/5    Left Shoulder Flexion  4+/5    Left Shoulder ABduction  4+/5    Left Shoulder Internal Rotation  5/5    Left Shoulder External Rotation  5/5    Right Elbow Flexion  4+/5    Right Elbow Extension  4+/5    Left Elbow Flexion  5/5    Left Elbow Extension  4+/5    Right Wrist Flexion  5/5    Right Wrist Extension  5/5    Left Wrist Flexion  5/5    Left Wrist Extension  5/5    Right Hand Gross Grasp  Functional    Left Hand Gross Grasp  Functional      Palpation   Spinal mobility  cervical and thoracic spine slightly hypomobile but non-painful    Palpation comment  palpable taut bands in bil levator scap, UT, lower trap, L>R throughout; palpation to levator scap and UT recreated pt's same pain      Special Tests    Special Tests  --    Cervical Tests  --         Objective measurements completed on examination: See above findings.       PT Education - 11/07/17 1141    Education provided  Yes    Education Details  exam findings, POC    Person(s) Educated  Patient    Methods  Explanation    Comprehension  Verbalized understanding       PT Short Term Goals - 11/07/17 1144      PT SHORT TERM GOAL #1   Title  Pt will be independent with HEP and perform consistently in order to decrease pain and maximize overall function.    Time  2    Period  Weeks    Status  New    Target Date  11/21/17  PT Long Term Goals - 11/07/17 1145      PT LONG TERM GOAL #1   Title  Pt will have 10 deg improvement throughout all cervical AROM to demo improved soft tissue length and decrease overall pain.    Time  4    Period  Weeks    Status  New    Target Date  12/05/17      PT LONG TERM GOAL #2   Title  Pt will have improved lower trap and middle trap MMT to at least 4/5 to promote improved posture and scapular stabilization in order to decrease pain with work duties.    Time  4    Period  Weeks    Status  New      PT  LONG TERM GOAL #3   Title  Pt will report minimal to no pain with palpation to L levator scap and L UT to demo improved soft tissue flexibility and extensibility and overall decreased pain.    Time  4    Period  Weeks    Status  New      PT LONG TERM GOAL #4   Title  Pt will report being able to perform all work duties during entire shift with minimal to no neck pain in order to demo improved overall functional strength and maximize return to PLOF.    Time  4    Period  Weeks    Status  New             Plan - 11/07/17 1141    Clinical Impression Statement  Pt is 62YO F who presents to OPPT with c/o chronic neck and LBP. She states that her neck pain is currently giving her the most issue and was agreeable to focusing on it during initial POC and once that is feeling better, the POC can begin to focus on her LBP. Pt currently presents with deficits in cervical AROM, minor BUE MMT deficits at 4+/5, deficits in functional strength AEB subjective reports of pain with work duties, and increased soft tissue restrictions with tenderness to palpation of UT, levator scap, and lower trap, L>R throughout. Pt reporting that palpation to L levator scap and UT recreated her same pain. Pt needs skilled PT intervention to address these deficits in order to decrease pain and maximize function at work.    History and Personal Factors relevant to plan of care:  chronicity of issue; PT in [place] with success; h/o fibromyalgia, R eye stroke with limited vision, asthma    Clinical Presentation  Stable    Clinical Presentation due to:  AROM, MMT, posture, functional strength, soft tissue restrictions    Clinical Decision Making  Low    Rehab Potential  Fair    PT Frequency  2x / week    PT Duration  4 weeks    PT Treatment/Interventions  ADLs/Self Care Home Management;Cryotherapy;Electrical Stimulation;Moist Heat;Traction;Ultrasound;Stair training;Gait training;Functional mobility training;Therapeutic  activities;Therapeutic exercise;Balance training;Neuromuscular re-education;Patient/family education;Manual techniques;Passive range of motion;Dry needling;Energy conservation;Taping;Vestibular    PT Next Visit Plan  review goals; initiate UT, levator scap stretching and issue as HEP; postural strengthening and scap stabilization, manual for soft tissue restrictions; potentially trigger point dry needling for soft tissue restrictions and pain management    PT Home Exercise Plan  initiate next visit    Recommended Other Services  potentially trigger point dry needling for soft tissue restrictions and pain management    Consulted and Agree with Plan of Care  Patient  Patient will benefit from skilled therapeutic intervention in order to improve the following deficits and impairments:  Decreased range of motion, Decreased strength, Hypomobility, Increased fascial restricitons, Increased muscle spasms, Impaired flexibility, Impaired UE functional use, Improper body mechanics, Postural dysfunction, Pain  Visit Diagnosis: Cervicalgia - Plan: PT plan of care cert/re-cert  Abnormal posture - Plan: PT plan of care cert/re-cert  Other symptoms and signs involving the musculoskeletal system - Plan: PT plan of care cert/re-cert  Chronic low back pain with right-sided sciatica, unspecified back pain laterality - Plan: PT plan of care cert/re-cert     Problem List Patient Active Problem List   Diagnosis Date Noted  . Allergic reaction 09/16/2017  . Allergic urticaria 09/16/2017  . Angioedema 09/16/2017  . Allergic rhinitis 09/16/2017  . Generalized anxiety disorder 12/03/2016  . Depression 12/03/2016  . GERD (gastroesophageal reflux disease) 12/03/2016  . Personal history of stroke with residual effects 12/03/2016  . Moderate persistent asthma 12/02/2016  . Fibromyalgia   . Adhesive capsulitis of right shoulder 08/31/2013  . Adhesive capsulitis 07/29/2013  . Rotator cuff syndrome of left  shoulder 07/29/2013  . Low back pain 12/01/2012  . BUNION 02/23/2008       Geraldine Solar PT, DPT  [facility] [address] [place], [place], [address] Phone: [phone]   Fax:  [phone]  Name: [name] MRN: [id number] Date of Birth: 11-14-1955

## 2017-11-11 ENCOUNTER — Telehealth (HOSPITAL_COMMUNITY): Payer: Self-pay | Admitting: Physical Therapy

## 2017-11-11 NOTE — Telephone Encounter (Signed)
Patient may be late tomorrow or will have to cancel she have a dental appt.

## 2017-11-12 ENCOUNTER — Encounter (HOSPITAL_COMMUNITY): Payer: Self-pay | Admitting: Physical Therapy

## 2017-11-12 ENCOUNTER — Ambulatory Visit (HOSPITAL_COMMUNITY): Payer: PPO | Admitting: Physical Therapy

## 2017-11-12 DIAGNOSIS — R293 Abnormal posture: Secondary | ICD-10-CM

## 2017-11-12 DIAGNOSIS — G8929 Other chronic pain: Secondary | ICD-10-CM

## 2017-11-12 DIAGNOSIS — M5441 Lumbago with sciatica, right side: Secondary | ICD-10-CM

## 2017-11-12 DIAGNOSIS — R29898 Other symptoms and signs involving the musculoskeletal system: Secondary | ICD-10-CM

## 2017-11-12 DIAGNOSIS — M542 Cervicalgia: Secondary | ICD-10-CM | POA: Diagnosis not present

## 2017-11-12 NOTE — Patient Instructions (Addendum)
Flexibility: Upper Trapezius Stretch    Gently grasp right side of head while holding onto the seat of the chair with other hand. Tilt head away until a gentle stretch is felt. Hold _15___ seconds. Repeat _3___ times per set. Do __1__ sets per session. Do __3__ sessions per day.  http://orth.exer.us/340   Copyright  VHI. All rights reserved.  Strengthening: Shoulder Shrug (Phase 1)    Shrug shoulders up  Backward now relax  Repeat _5___ times per set. Do ___1_ sets per session. Do _2___ sessions per day.  http://orth.exer.us/336   Copyright  VHI. All rights reserved.

## 2017-11-12 NOTE — Therapy (Signed)
[place], [place], [address] Phone: [phone]   Fax:  [phone]  Physical Therapy Treatment  Patient Details  Name: [name] MRN: [id number] Date of Birth: 1956-01-19 Referring Provider: Sinda Du, MD   Encounter Date: 11/12/2017  PT End of Session - 11/12/17 1340    Visit Number  2    Number of Visits  9    Date for PT Re-Evaluation  12/05/17    Authorization Type  [employer]    PT Start Time  1300    PT Stop Time  1340    PT Time Calculation (min)  40 min    Activity Tolerance  Patient tolerated treatment well;No increased pain    Behavior During Therapy  WFL for tasks assessed/performed       Past Medical History:  Diagnosis Date  . Abnormal Pap smear of cervix   . Asthma   . Fibromyalgia     Past Surgical History:  Procedure Laterality Date  . ADENOIDECTOMY    . APPENDECTOMY    . BUNIONECTOMY    . COLONOSCOPY  10/08/2011   Procedure: COLONOSCOPY;  Surgeon: Dorothyann Peng, MD;  [facility];  Service: Endoscopy;  Laterality: N/A;  1:45 PM  . TONSILLECTOMY    . TUBAL LIGATION      There were no vitals filed for this visit.  Subjective Assessment - 11/12/17 1302    Subjective  Pt states that her neck is bother her more than her back at this time.      Pertinent History  fibromyalgia, h/o R eye stroke (limited vision)    Limitations  Lifting    How long can you sit comfortably?  tries not to sit    How long can you stand comfortably?  varies    How long can you walk comfortably?  no issues    Patient Stated Goals  to get better    Currently in Pain?  Yes    Pain Score  7     Pain Location  Neck    Pain Descriptors / Indicators  Aching    Pain Type  Chronic pain    Pain Onset  More than a month ago    Pain Frequency  Intermittent    Aggravating Factors   turning her head to the lefft     Pain Relieving Factors  not sure    Multiple Pain Sites  Yes     Pain Score  5    Pain Location  Buttocks    Pain Orientation  Right    Pain Descriptors / Indicators  Burning    Pain Onset  1 to 4 weeks ago    Pain Frequency  Intermittent                No data recorded       [facility] Adult PT Treatment/Exercise - 11/12/17 0001      Exercises   Exercises  Neck;Lumbar      Neck Exercises: Seated   Shoulder Shrugs  5 reps    Shoulder Rolls  Backwards;5 reps    Other Seated Exercise  3-D cervica excursion       Lumbar Exercises: Stretches   Piriformis Stretch  Right;2 reps;30 seconds    Piriformis Stretch Limitations  quadirped       Lumbar Exercises: Standing   Other Standing Lumbar Exercises  3 D hip excursion  Neck Exercises: Stretches   Neck Stretch  3 reps;30 seconds             PT Education - 11/12/17 1345    Education provided  Yes    Education Details  HEP and proper body mechanics for bed mobility     Person(s) Educated  Patient    Methods  Explanation    Comprehension  Verbalized understanding       PT Short Term Goals - 11/12/17 1344      PT SHORT TERM GOAL #1   Title  Pt will be independent with HEP and perform consistently in order to decrease pain and maximize overall function.    Time  2    Period  Weeks    Status  On-going        PT Long Term Goals - 11/12/17 1344      PT LONG TERM GOAL #1   Title  Pt will have 10 deg improvement throughout all cervical AROM to demo improved soft tissue length and decrease overall pain.    Time  4    Period  Weeks    Status  On-going      PT LONG TERM GOAL #2   Title  Pt will have improved lower trap and middle trap MMT to at least 4/5 to promote improved posture and scapular stabilization in order to decrease pain with work duties.    Time  4    Period  Weeks    Status  On-going      PT LONG TERM GOAL #3   Title  Pt will report minimal to no pain with palpation to L levator scap and L UT to demo improved soft tissue flexibility and  extensibility and overall decreased pain.    Time  4    Period  Weeks    Status  On-going      PT LONG TERM GOAL #4   Title  Pt will report being able to perform all work duties during entire shift with minimal to no neck pain in order to demo improved overall functional strength and maximize return to PLOF.    Time  4    Period  Weeks    Status  On-going            Plan - 11/12/17 1340    Clinical Impression Statement  Therapist reviewed evaluation and goals with patient.  Pt started on and given HEP for improved mobility in cervical and lumbar area.      Rehab Potential  Fair    PT Frequency  2x / week    PT Duration  4 weeks    PT Treatment/Interventions  ADLs/Self Care Home Management;Cryotherapy;Electrical Stimulation;Moist Heat;Traction;Ultrasound;Stair training;Gait training;Functional mobility training;Therapeutic activities;Therapeutic exercise;Balance training;Neuromuscular re-education;Patient/family education;Manual techniques;Passive range of motion;Dry needling;Energy conservation;Taping;Vestibular    PT Next Visit Plan  Begin decompression t-band exercises, scapular stability, possibly initiate manual and needling for pain relief.     PT Home Exercise Plan  11/12/2017:  cervical and hip excursion, neck stretch, decompression exercises 1-5.     Consulted and Agree with Plan of Care  Patient       Patient will benefit from skilled therapeutic intervention in order to improve the following deficits and impairments:  Decreased range of motion, Decreased strength, Hypomobility, Increased fascial restricitons, Increased muscle spasms, Impaired flexibility, Impaired UE functional use, Improper body mechanics, Postural dysfunction, Pain  Visit Diagnosis: Cervicalgia  Abnormal posture  Other symptoms and signs involving the musculoskeletal  system  Chronic low back pain with right-sided sciatica, unspecified back pain laterality     Problem List Patient Active Problem  List   Diagnosis Date Noted  . Allergic reaction 09/16/2017  . Allergic urticaria 09/16/2017  . Angioedema 09/16/2017  . Allergic rhinitis 09/16/2017  . Generalized anxiety disorder 12/03/2016  . Depression 12/03/2016  . GERD (gastroesophageal reflux disease) 12/03/2016  . Personal history of stroke with residual effects 12/03/2016  . Moderate persistent asthma 12/02/2016  . Fibromyalgia   . Adhesive capsulitis of right shoulder 08/31/2013  . Adhesive capsulitis 07/29/2013  . Rotator cuff syndrome of left shoulder 07/29/2013  . Low back pain 12/01/2012  . BUNION 02/23/2008    Rayetta Humphrey, PT CLT [phone] 11/12/2017, 1:46 PM  [facility] [address] , [place], [address] Phone: [phone]   Fax:  [phone]  Name: [name] MRN: [id number] Date of Birth: [birth year]

## 2017-11-14 ENCOUNTER — Encounter (HOSPITAL_COMMUNITY): Payer: Self-pay

## 2017-11-14 ENCOUNTER — Ambulatory Visit (HOSPITAL_COMMUNITY): Payer: PPO

## 2017-11-14 DIAGNOSIS — G8929 Other chronic pain: Secondary | ICD-10-CM

## 2017-11-14 DIAGNOSIS — M5441 Lumbago with sciatica, right side: Secondary | ICD-10-CM

## 2017-11-14 DIAGNOSIS — R29898 Other symptoms and signs involving the musculoskeletal system: Secondary | ICD-10-CM

## 2017-11-14 DIAGNOSIS — M542 Cervicalgia: Secondary | ICD-10-CM

## 2017-11-14 DIAGNOSIS — R293 Abnormal posture: Secondary | ICD-10-CM

## 2017-11-14 NOTE — Therapy (Signed)
[place], [place], [address] Phone: [phone]   Fax:  [phone]  Physical Therapy Treatment  Patient Details  Name: [name] MRN: [id number] Date of Birth: [birth year] Referring Provider: Sinda Du, MD   Encounter Date: 11/14/2017  PT End of Session - 11/14/17 1158    Visit Number  3    Number of Visits  9    Date for PT Re-Evaluation  12/05/17    Authorization Type  [employer]    PT Start Time  1118    PT Stop Time  1153    PT Time Calculation (min)  35 min    Activity Tolerance  Patient tolerated treatment well;No increased pain    Behavior During Therapy  WFL for tasks assessed/performed       Past Medical History:  Diagnosis Date  . Abnormal Pap smear of cervix   . Asthma   . Fibromyalgia     Past Surgical History:  Procedure Laterality Date  . ADENOIDECTOMY    . APPENDECTOMY    . BUNIONECTOMY    . COLONOSCOPY  10/08/2011   Procedure: COLONOSCOPY;  Surgeon: Dorothyann Peng, MD;  [facility];  Service: Endoscopy;  Laterality: N/A;  1:45 PM  . TONSILLECTOMY    . TUBAL LIGATION      There were no vitals filed for this visit.  Subjective Assessment - 11/14/17 1155    Subjective  Pt states that her neck is bother her more than her back at this time.  Doesn't feel she has not had any improvement in her pain so far in PT. Turning head quickly to left most painful.    Pertinent History  fibromyalgia, h/o R eye stroke (limited vision)    Limitations  Lifting    How long can you sit comfortably?  tries not to sit    How long can you stand comfortably?  varies    How long can you walk comfortably?  no issues    Patient Stated Goals  to get better    Currently in Pain?  Yes    Pain Score  5     Pain Location  Neck    Pain Orientation  Left;Posterior    Pain Descriptors / Indicators  Aching;Sharp    Pain Type  Chronic pain    Pain Onset  More than a month ago     Pain Frequency  Intermittent    Aggravating Factors   turning head to left    Pain Onset  1 to 4 weeks ago                No data recorded       [facility] Adult PT Treatment/Exercise - 11/14/17 0001      Manual Therapy   Manual Therapy  Joint mobilization;Soft tissue mobilization;Myofascial release    Manual therapy comments  manual performed separate from all other exercises    Joint Mobilization  rib screw    Soft tissue mobilization  Lt levator scap, Ut, rhomboid, mid trap trigger point    Myofascial Release  suboccipital release             PT Education - 11/14/17 1206    Education provided  Yes    Education Details  PT attempted to review posture, log rolling in/out bed, and HEP stretches.     Person(s) Educated  Patient    Methods  Explanation    Comprehension  Need further instruction;Other (comment) Patient was dismissive of performing HEP other than quickly as review.       PT Short Term Goals - 11/12/17 1344      PT SHORT TERM GOAL #1   Title  Pt will be independent with HEP and perform consistently in order to decrease pain and maximize overall function.    Time  2    Period  Weeks    Status  On-going        PT Long Term Goals - 11/12/17 1344      PT LONG TERM GOAL #1   Title  Pt will have 10 deg improvement throughout all cervical AROM to demo improved soft tissue length and decrease overall pain.    Time  4    Period  Weeks    Status  On-going      PT LONG TERM GOAL #2   Title  Pt will have improved lower trap and middle trap MMT to at least 4/5 to promote improved posture and scapular stabilization in order to decrease pain with work duties.    Time  4    Period  Weeks    Status  On-going      PT LONG TERM GOAL #3   Title  Pt will report minimal to no pain with palpation to L levator scap and L UT to demo improved soft tissue flexibility and extensibility and overall decreased pain.    Time  4    Period  Weeks    Status   On-going      PT LONG TERM GOAL #4   Title  Pt will report being able to perform all work duties during entire shift with minimal to no neck pain in order to demo improved overall functional strength and maximize return to PLOF.    Time  4    Period  Weeks    Status  On-going            Plan - 11/14/17 1207    Clinical Impression Statement  Therapist attempted to review HEP, body mechanics and posture. Patient frustrated with lack of progress thus far with PT. PT primarily performed manual therapy with significant reduction in patient's pain level and an increase in AROM of Cspine rotation to the left. Patient would continue to benefit from skilled physical therapy to improve posterior spine strength, abdominal strength, decrease pain and improve functional ability. Patient agreeable to perform therapeutic exercises next session, including decompression exercises and review HEP. Patient planning on scheduling regular medical massages after finished with PT. Patient given handout of massage therapists.     Rehab Potential  Fair    PT Frequency  2x / week    PT Duration  4 weeks    PT Treatment/Interventions  ADLs/Self Care Home Management;Cryotherapy;Electrical Stimulation;Moist Heat;Traction;Ultrasound;Stair training;Gait training;Functional mobility training;Therapeutic activities;Therapeutic exercise;Balance training;Neuromuscular re-education;Patient/family education;Manual techniques;Passive range of motion;Dry needling;Energy conservation;Taping;Vestibular    PT Next Visit Plan  Begin decompression t-band exercises, scapular stability, possibly continue manual and initiate needling for pain relief.     PT Home Exercise Plan  11/12/2017:  cervical and hip excursion, neck stretch, decompression exercises 1-5.     Consulted and Agree with Plan of Care  Patient       Patient will benefit from skilled therapeutic intervention in order to improve the following deficits and impairments:   Decreased range of motion, Decreased strength, Hypomobility, Increased fascial restricitons, Increased muscle spasms, Impaired flexibility, Impaired UE functional use, Improper body  mechanics, Postural dysfunction, Pain  Visit Diagnosis: Cervicalgia  Abnormal posture  Other symptoms and signs involving the musculoskeletal system  Chronic low back pain with right-sided sciatica, unspecified back pain laterality     Problem List Patient Active Problem List   Diagnosis Date Noted  . Allergic reaction 09/16/2017  . Allergic urticaria 09/16/2017  . Angioedema 09/16/2017  . Allergic rhinitis 09/16/2017  . Generalized anxiety disorder 12/03/2016  . Depression 12/03/2016  . GERD (gastroesophageal reflux disease) 12/03/2016  . Personal history of stroke with residual effects 12/03/2016  . Moderate persistent asthma 12/02/2016  . Fibromyalgia   . Adhesive capsulitis of right shoulder 08/31/2013  . Adhesive capsulitis 07/29/2013  . Rotator cuff syndrome of left shoulder 07/29/2013  . Low back pain 12/01/2012  . BUNION 02/23/2008    Floria Raveling. Hartnett-Rands, MS, PT [facility] #[id number] 11/14/2017, 12:14 PM  [facility] [address] [place], [place], [address] Phone: [phone]   Fax:  [phone]  Name: [name] MRN: [id number] Date of Birth: Oct 19, 1955

## 2017-11-18 ENCOUNTER — Ambulatory Visit (HOSPITAL_COMMUNITY): Payer: PPO | Attending: Pulmonary Disease

## 2017-11-18 ENCOUNTER — Telehealth (HOSPITAL_COMMUNITY): Payer: Self-pay | Admitting: Pulmonary Disease

## 2017-11-18 ENCOUNTER — Encounter (HOSPITAL_COMMUNITY): Payer: Self-pay

## 2017-11-18 ENCOUNTER — Other Ambulatory Visit: Payer: Self-pay

## 2017-11-18 DIAGNOSIS — R293 Abnormal posture: Secondary | ICD-10-CM | POA: Diagnosis not present

## 2017-11-18 DIAGNOSIS — M542 Cervicalgia: Secondary | ICD-10-CM | POA: Diagnosis not present

## 2017-11-18 DIAGNOSIS — M5441 Lumbago with sciatica, right side: Secondary | ICD-10-CM | POA: Diagnosis not present

## 2017-11-18 DIAGNOSIS — R29898 Other symptoms and signs involving the musculoskeletal system: Secondary | ICD-10-CM | POA: Diagnosis not present

## 2017-11-18 DIAGNOSIS — G8929 Other chronic pain: Secondary | ICD-10-CM | POA: Insufficient documentation

## 2017-11-18 NOTE — Therapy (Signed)
[place], [place], [address] Phone: [phone]   Fax:  [phone]  Physical Therapy Treatment  Patient Details  Name: [name] MRN: [id number] Date of Birth: 1955/12/04 Referring Provider: Sinda Du, MD   Encounter Date: 11/18/2017  PT End of Session - 11/18/17 1346    Visit Number  4    Number of Visits  9    Date for PT Re-Evaluation  12/05/17    Authorization Type  [employer]    PT Start Time  1303    PT Stop Time  1342    PT Time Calculation (min)  39 min    Activity Tolerance  Patient tolerated treatment well;No increased pain    Behavior During Therapy  WFL for tasks assessed/performed       Past Medical History:  Diagnosis Date  . Abnormal Pap smear of cervix   . Asthma   . Fibromyalgia     Past Surgical History:  Procedure Laterality Date  . ADENOIDECTOMY    . APPENDECTOMY    . BUNIONECTOMY    . COLONOSCOPY  10/08/2011   Procedure: COLONOSCOPY;  Surgeon: Dorothyann Peng, MD;  [facility];  Service: Endoscopy;  Laterality: N/A;  1:45 PM  . TONSILLECTOMY    . TUBAL LIGATION      There were no vitals filed for this visit.  Subjective Assessment - 11/18/17 1307    Subjective  Patient arrives stating she almost cancelled and that today her "fibro pain" is really bad. She states it is a "stay in bed kind of day". She reports she has not been able to do her HEP because she does not have time and states that she has been in too much pain to do them.     Pertinent History  fibromyalgia, h/o R eye stroke (limited vision)    Limitations  Lifting    How long can you sit comfortably?  tries not to sit    How long can you stand comfortably?  varies    How long can you walk comfortably?  no issues    Patient Stated Goals  to get better    Currently in Pain?  Yes    Pain Score  7     Pain Location  Generalized fibromyalgia    Pain Orientation  Other (Comment)  generalized fibromyalgia pain    Pain Descriptors / Indicators  Aching;Sharp    Pain Type  Chronic pain    Pain Onset  More than a month ago    Pain Frequency  Intermittent    Pain Onset  --       [facility] Adult PT Treatment/Exercise - 11/18/17 0001      Neck Exercises: Standing   Other Standing Exercises  wall slide with lift off, 15 reps      Neck Exercises: Seated   Money  15 reps;3 secs    Shoulder Shrugs  10 reps    Shoulder Rolls  Backwards;Forwards;10 reps    Other Seated Exercise  upper trap stretch; 2x 30 seconds Bilateraly      Neck Exercises: Supine   Neck Retraction  15 reps;3 secs    Cervical Rotation  Both;5 reps      Lumbar Exercises: Stretches   Piriformis Stretch  Right;2 reps;30 seconds    Piriformis Stretch Limitations  seated      Manual Therapy   Manual Therapy  Soft tissue mobilization;Myofascial release  Manual therapy comments  manual performed separate from all other exercises    Soft tissue mobilization  Bil levator scap, and upper trap, cervical paraspinals    Myofascial Release  suboccipital release        PT Education - 11/18/17 1345    Education provided  Yes    Education Details  Educated on benefits of exercise for pain reduction and reviewed HEP exercises and stretches.     Person(s) Educated  Patient    Methods  Explanation    Comprehension  Verbalized understanding       PT Short Term Goals - 11/12/17 1344      PT SHORT TERM GOAL #1   Title  Pt will be independent with HEP and perform consistently in order to decrease pain and maximize overall function.    Time  2    Period  Weeks    Status  On-going        PT Long Term Goals - 11/12/17 1344      PT LONG TERM GOAL #1   Title  Pt will have 10 deg improvement throughout all cervical AROM to demo improved soft tissue length and decrease overall pain.    Time  4    Period  Weeks    Status  On-going      PT LONG TERM GOAL #2   Title  Pt will have improved lower trap and  middle trap MMT to at least 4/5 to promote improved posture and scapular stabilization in order to decrease pain with work duties.    Time  4    Period  Weeks    Status  On-going      PT LONG TERM GOAL #3   Title  Pt will report minimal to no pain with palpation to L levator scap and L UT to demo improved soft tissue flexibility and extensibility and overall decreased pain.    Time  4    Period  Weeks    Status  On-going      PT LONG TERM GOAL #4   Title  Pt will report being able to perform all work duties during entire shift with minimal to no neck pain in order to demo improved overall functional strength and maximize return to PLOF.    Time  4    Period  Weeks    Status  On-going        Plan - 11/18/17 1346    Clinical Impression Statement  Patient arrived this session reporting significant amount of pain due to her fibromyalgia. Plan for start of session was to warm muscles up and begin postural training with UBE however patient was not willing to perform this exercise. Light exercises for ROM and stretching were performed alternatively and HEP was reviewed. She was able to complete all seated exercises with no discomfort and had mild discomfort in her neck with standing postural strengthening. She had a positive response to manual therapy reporting decreased headache and no neck pain at EOS. She will continue to benefit from skilled PT services to address impairments and improve QOL.    Rehab Potential  Fair    PT Frequency  2x / week    PT Duration  4 weeks    PT Treatment/Interventions  ADLs/Self Care Home Management;Cryotherapy;Electrical Stimulation;Moist Heat;Traction;Ultrasound;Stair training;Gait training;Functional mobility training;Therapeutic activities;Therapeutic exercise;Balance training;Neuromuscular re-education;Patient/family education;Manual techniques;Passive range of motion;Dry needling;Energy conservation;Taping;Vestibular    PT Next Visit Plan  Continue seated  postural exercises and progress standigns. Initiate  decompression t-band exercises, scapular stability, possibly continue manual and initiate needling for pain relief.     PT Home Exercise Plan  11/12/2017:  cervical and hip excursion, neck stretch, decompression exercises 1-5.     Consulted and Agree with Plan of Care  Patient       Patient will benefit from skilled therapeutic intervention in order to improve the following deficits and impairments:  Decreased range of motion, Decreased strength, Hypomobility, Increased fascial restricitons, Increased muscle spasms, Impaired flexibility, Impaired UE functional use, Improper body mechanics, Postural dysfunction, Pain  Visit Diagnosis: Cervicalgia  Abnormal posture  Other symptoms and signs involving the musculoskeletal system  Chronic low back pain with right-sided sciatica, unspecified back pain laterality     Problem List Patient Active Problem List   Diagnosis Date Noted  . Allergic reaction 09/16/2017  . Allergic urticaria 09/16/2017  . Angioedema 09/16/2017  . Allergic rhinitis 09/16/2017  . Generalized anxiety disorder 12/03/2016  . Depression 12/03/2016  . GERD (gastroesophageal reflux disease) 12/03/2016  . Personal history of stroke with residual effects 12/03/2016  . Moderate persistent asthma 12/02/2016  . Fibromyalgia   . Adhesive capsulitis of right shoulder 08/31/2013  . Adhesive capsulitis 07/29/2013  . Rotator cuff syndrome of left shoulder 07/29/2013  . Low back pain 12/01/2012  . BUNION 02/23/2008    Kipp Brood, PT, DPT Physical Therapist with [facility]  11/18/2017 4:30 PM    [facility] [address] [place], [place], [address] Phone: [phone]   Fax:  [phone]  Name: [name] MRN: [id number] Date of Birth: Aug 27, 1955

## 2017-11-18 NOTE — Telephone Encounter (Signed)
11/18/17  Pt left a message to say that 1:00 would work for her today so I added her to that schedule time on Rachel's schedule

## 2017-11-18 NOTE — Telephone Encounter (Signed)
11/18/17  patient asked that these be cx said that she had conflicts

## 2017-11-21 ENCOUNTER — Encounter (HOSPITAL_COMMUNITY): Payer: PPO

## 2017-11-21 ENCOUNTER — Telehealth (HOSPITAL_COMMUNITY): Payer: Self-pay

## 2017-11-21 NOTE — Telephone Encounter (Signed)
Ms Deblois cancelled 4/4 and 4/16 appointments when she was here on 4/1; PT called pt as the 4/4 appointment was still scheduled and she stated that she asked to cancel these 2 appointments the last time she was here. Not counting this appointment as a no show since pt did ask to cancel it in advance.   Geraldine Solar PT, DPT

## 2017-11-26 ENCOUNTER — Telehealth (HOSPITAL_COMMUNITY): Payer: Self-pay | Admitting: Pulmonary Disease

## 2017-11-26 ENCOUNTER — Ambulatory Visit (HOSPITAL_COMMUNITY): Payer: PPO

## 2017-11-26 DIAGNOSIS — M79672 Pain in left foot: Secondary | ICD-10-CM | POA: Diagnosis not present

## 2017-11-26 DIAGNOSIS — M7742 Metatarsalgia, left foot: Secondary | ICD-10-CM | POA: Diagnosis not present

## 2017-11-26 NOTE — Telephone Encounter (Signed)
11/26/17  pt called to say that she won't be getting off work in time so would need to cancel today's appt.

## 2017-11-28 ENCOUNTER — Encounter (HOSPITAL_COMMUNITY): Payer: PPO

## 2017-12-02 ENCOUNTER — Telehealth (HOSPITAL_COMMUNITY): Payer: Self-pay | Admitting: Pulmonary Disease

## 2017-12-02 NOTE — Telephone Encounter (Signed)
12/02/17  pt called and asked to change the appt.

## 2017-12-03 ENCOUNTER — Encounter (HOSPITAL_COMMUNITY): Payer: PPO

## 2017-12-05 ENCOUNTER — Ambulatory Visit (HOSPITAL_COMMUNITY): Payer: PPO

## 2017-12-10 ENCOUNTER — Telehealth (HOSPITAL_COMMUNITY): Payer: Self-pay | Admitting: Pulmonary Disease

## 2017-12-10 ENCOUNTER — Ambulatory Visit (HOSPITAL_COMMUNITY): Payer: PPO

## 2017-12-10 NOTE — Telephone Encounter (Signed)
12/10/17  she came in and said that she didn't want to keep this appointment today and to go ahead and discharge her

## 2017-12-12 ENCOUNTER — Other Ambulatory Visit: Payer: Self-pay | Admitting: Obstetrics and Gynecology

## 2017-12-13 NOTE — Telephone Encounter (Signed)
Refill phentermine 1/2 to 1 tablet every 3 days for fatigue

## 2017-12-17 ENCOUNTER — Ambulatory Visit: Payer: PPO | Admitting: Adult Health

## 2017-12-17 ENCOUNTER — Encounter: Payer: Self-pay | Admitting: Adult Health

## 2017-12-17 VITALS — BP 134/80 | HR 90 | Ht 68.0 in | Wt 147.0 lb

## 2017-12-17 DIAGNOSIS — M79672 Pain in left foot: Secondary | ICD-10-CM | POA: Diagnosis not present

## 2017-12-17 DIAGNOSIS — R232 Flushing: Secondary | ICD-10-CM

## 2017-12-17 DIAGNOSIS — M7742 Metatarsalgia, left foot: Secondary | ICD-10-CM | POA: Diagnosis not present

## 2017-12-17 NOTE — Progress Notes (Signed)
  Subjective:     Patient ID: [name], female   DOB: 04-17-1956, 62 y.o.   MRN: [id number]  HPI [name] is a 62 year old white female, back in follow up of 3 am hot flashes and is better on Paxil. She wonders if she needs measles booster, I would check immunity first then get if neded.   Review of Systems Not waking up at 3 am with hot flashes, now notices at about 7 pm but better Has had cramp near heart, yawning and hiccups recently  Reviewed past medical,surgical, social and family history. Reviewed medications and allergies.     Objective:   Physical Exam BP 134/80 (BP Location: Left Arm, Patient Position: Sitting, Cuff Size: Normal)   Pulse 90   Ht 5\' 8"  (1.727 m)   Wt 147 lb (66.7 kg)   LMP 12/13/2015   BMI 22.35 kg/m  Skin warm and dry. Lungs: clear to ausculation bilaterally. Cardiovascular: regular rate and rhythm.    Assessment:     1. Hot flashes       Plan:     Continue Paxil F/U with PCP F/U prn

## 2017-12-19 ENCOUNTER — Other Ambulatory Visit: Payer: Self-pay

## 2017-12-19 ENCOUNTER — Emergency Department (HOSPITAL_COMMUNITY)
Admission: EM | Admit: 2017-12-19 | Discharge: 2017-12-19 | Disposition: A | Discharge status: Home / Self Care | Payer: PPO | Attending: Emergency Medicine | Admitting: Emergency Medicine

## 2017-12-19 ENCOUNTER — Encounter (HOSPITAL_COMMUNITY): Payer: Self-pay

## 2017-12-19 DIAGNOSIS — Z87891 Personal history of nicotine dependence: Secondary | ICD-10-CM | POA: Diagnosis not present

## 2017-12-19 DIAGNOSIS — J45909 Unspecified asthma, uncomplicated: Secondary | ICD-10-CM | POA: Insufficient documentation

## 2017-12-19 DIAGNOSIS — Z79899 Other long term (current) drug therapy: Secondary | ICD-10-CM | POA: Insufficient documentation

## 2017-12-19 DIAGNOSIS — R062 Wheezing: Secondary | ICD-10-CM | POA: Diagnosis not present

## 2017-12-19 DIAGNOSIS — R21 Rash and other nonspecific skin eruption: Secondary | ICD-10-CM | POA: Insufficient documentation

## 2017-12-19 DIAGNOSIS — T7840XA Allergy, unspecified, initial encounter: Secondary | ICD-10-CM

## 2017-12-19 DIAGNOSIS — L299 Pruritus, unspecified: Secondary | ICD-10-CM | POA: Diagnosis present

## 2017-12-19 DIAGNOSIS — T782XXA Anaphylactic shock, unspecified, initial encounter: Secondary | ICD-10-CM | POA: Diagnosis not present

## 2017-12-19 MED ORDER — DIPHENHYDRAMINE HCL 50 MG/ML IJ SOLN
50.0000 mg | Freq: Once | INTRAMUSCULAR | Status: AC
  Administered 2017-12-19: 50 mg via INTRAVENOUS
  Filled 2017-12-19: qty 1

## 2017-12-19 MED ORDER — PREDNISONE 10 MG PO TABS
60.0000 mg | ORAL_TABLET | Freq: Once | ORAL | Status: AC
  Administered 2017-12-19: 60 mg via ORAL
  Filled 2017-12-19: qty 1

## 2017-12-19 MED ORDER — EPINEPHRINE 0.3 MG/0.3ML IJ SOAJ: 0.3000 mg | Freq: Once | INTRAMUSCULAR | 0 refills | Status: AC

## 2017-12-19 NOTE — ED Triage Notes (Signed)
Pt was in the dentist office and started breaking out in hives and itching.  She was there to have some crowns put on.  She was given lidocaine 2% with epi and 50mg  benadryl.  Pt had similar reaction after eating a salad at [facility] and had been through allergy testing and given an epi pen.  Pt denies any respiratory complaints but red all over and itching.  Pt having chills upon arrival to er.

## 2017-12-19 NOTE — ED Provider Notes (Signed)
[facility] EMERGENCY DEPARTMENT Provider Note   CSN: [id number] Arrival date & time: 12/19/17  1514     History   Chief Complaint Chief Complaint  Patient presents with  . Allergic Reaction    HPI [name] is a 62 y.o. female.  HPI Patient was injected into her gingiva by dentist with lidocaine 2% with epinephrine at 12 PM and again approximately at 2:45 PM.  Immediately after the second injection she developed itching and red rash diffusely.  She denies any shortness of breath, swelling of her throat or difficulty speaking or swallowing.  She was treated with Benadryl 50 mg orally by her dentist and sent here for further evaluation.  She continues to have diffuse itching.  No other associated symptoms.  Nothing makes symptoms better or worse Past Medical History:  Diagnosis Date  . Abnormal Pap smear of cervix   . Asthma   . Fibromyalgia     Patient Active Problem List   Diagnosis Date Noted  . Allergic reaction 09/16/2017  . Allergic urticaria 09/16/2017  . Angioedema 09/16/2017  . Allergic rhinitis 09/16/2017  . Generalized anxiety disorder 12/03/2016  . Depression 12/03/2016  . GERD (gastroesophageal reflux disease) 12/03/2016  . Personal history of stroke with residual effects 12/03/2016  . Moderate persistent asthma 12/02/2016  . Fibromyalgia   . Adhesive capsulitis of right shoulder 08/31/2013  . Adhesive capsulitis 07/29/2013  . Rotator cuff syndrome of left shoulder 07/29/2013  . Low back pain 12/01/2012  . BUNION 02/23/2008    Past Surgical History:  Procedure Laterality Date  . ADENOIDECTOMY    . APPENDECTOMY    . BUNIONECTOMY    . COLONOSCOPY  10/08/2011   Procedure: COLONOSCOPY;  Surgeon: Dorothyann Peng, MD;  [facility];  Service: Endoscopy;  Laterality: N/A;  1:45 PM  . TONSILLECTOMY    . TUBAL LIGATION       OB History    Gravida  5   Para  5   Term  5   Preterm      AB      Living  5     SAB      TAB     Ectopic      Multiple      Live Births               Home Medications    Prior to Admission medications   Medication Sig Start Date End Date Taking? Authorizing Provider  predniSONE (DELTASONE) 10 MG tablet Take 4 tablets by mouth daily for 3 days, then 3 tablets daily for 3 days, then 2 tablets daily for 3 days, then 1 tablet daily for 3 days (WHEN NEEDED) as needed 04/14/08  Yes [provider]  ALPRAZolam Duanne Moron) 1 MG tablet Take 1 mg by mouth as needed.  08/17/17   [provider]  amLODipine (NORVASC) 5 MG tablet Take 5 mg by mouth daily. 07/04/17   [provider]  budesonide-formoterol (SYMBICORT) 160-4.5 MCG/ACT inhaler Inhale 2 puffs into the lungs 2 (two) times daily.    [provider]  Butalbital-APAP-Caffeine 50-300-40 MG CAPS Take 1 capsule by mouth as needed (for migraines). For headache    [provider]  fluticasone (FLONASE) 50 MCG/ACT nasal spray Place 1 spray into both nostrils 2 (two) times daily as needed for allergies or rhinitis. Patient taking differently: Place 1 spray into both nostrils 2 (two) times daily.  09/16/17   Bobbitt, Sedalia Muta, MD  furosemide (LASIX) 40 MG tablet Take 40 mg by mouth daily as needed for edema.     [provider]  ibuprofen (ADVIL,MOTRIN) 800 MG tablet Take 800 mg by mouth every 8 (eight) hours as needed for mild pain or moderate pain.    [provider]  lubiprostone (AMITIZA) 24 MCG capsule Take 24 mcg by mouth 2 (two) times daily.     [provider]  MUCINEX 600 MG 12 hr tablet 1,200 mg 2 (two) times daily.  08/17/17   [provider]  pantoprazole (PROTONIX) 40 MG tablet Take 40 mg by mouth daily.    [provider]  PARoxetine (PAXIL) 10 MG tablet Take 1 tablet (10 mg total) by mouth daily. 11/05/17   Estill Dooms, NP  phentermine (ADIPEX-P) 37.5 MG tablet TAKE ONE-HALF TO ONE TABLET EVERY 3 DAYSFOR FATIGUE 12/13/17   [name], MD  PROAIR HFA [phone] Base) MCG/ACT inhaler inhale 2 puffs by mouth four times a day if needed 07/01/17   [provider]  topiramate (TOPAMAX) 200 MG tablet Take 200 mg by mouth 2 (two) times daily.    [provider]  traMADol (ULTRAM) 50 MG tablet Take 150 mg by mouth at bedtime.     [provider]    Family History Family History  Problem Relation Age of Onset  . Hypertension Mother   . Aneurysm Father   . Hypertension Father   . Cancer Brother   . COPD Brother   . Angioedema Neg Hx   . Atopy Neg Hx   . Eczema Neg Hx   . Immunodeficiency Neg Hx   . Urticaria Neg Hx   . Asthma Neg Hx   . Allergic rhinitis Neg Hx     Social History Social History   Tobacco Use  . Smoking status: Former Smoker    Types: Cigarettes  . Smokeless tobacco: Never Used  Substance Use Topics  . Alcohol use: No  . Drug use: No     Allergies   Other   Review of Systems Review of Systems  Constitutional: Negative.   HENT: Negative.   Respiratory: Negative.   Cardiovascular: Negative.   Gastrointestinal: Negative.   Musculoskeletal: Negative.   Skin: Positive for rash.       Diffuse itching  Neurological: Negative.   Psychiatric/Behavioral: Negative.   All other systems reviewed and are negative.    Physical Exam Updated Vital Signs BP (!) 148/98   Pulse 93   Temp (!) 97.5 F (36.4 C) (Oral)   Ht 5\' 10"  (1.778 m)   Wt 64.9 kg (143 lb)   LMP 12/13/2015   SpO2 100%   BMI 20.52 kg/m   Physical Exam  Constitutional: She appears well-developed and well-nourished. No distress.  HENT:  Head: Normocephalic and atraumatic.  Right Ear: External ear normal.  Left Ear: External ear normal.  Mouth/Throat: Oropharynx is clear and moist.  No swelling of tongue lips or uvula.  No mucosal lesions  Eyes: Pupils are equal, round, and reactive to light. Conjunctivae are normal.  Neck: Neck supple. No tracheal deviation present. No thyromegaly  present.  Cardiovascular: Normal rate and regular rhythm.  No murmur heard. Pulmonary/Chest: Effort normal and breath sounds normal.  Abdominal: Soft. Bowel sounds are normal. She exhibits no distension. There is no tenderness.  Musculoskeletal: Normal range of motion. She exhibits no edema or tenderness.  Neurological: She is alert. Coordination normal.  Skin: Skin is warm and dry.  There is erythema.  Skin diffusely reddened and pruritic  Psychiatric: She has a normal mood and affect.  Nursing note and vitals reviewed.    ED Treatments / Results  Labs (all labs ordered are listed, but only abnormal results are displayed) Labs Reviewed - No data to display  EKG None  Radiology No results found.  Procedures Procedures (including critical care time)  Medications Ordered in ED Medications  diphenhydrAMINE (BENADRYL) injection 50 mg (50 mg Intravenous Given 12/19/17 1547)  predniSONE (DELTASONE) tablet 60 mg (60 mg Oral Given 12/19/17 1548)     Initial Impression / Assessment and Plan / ED Course  I have reviewed the triage vital signs and the nursing notes.  Pertinent labs & imaging results that were available during my care of the patient were reviewed by me and considered in my medical decision making (see chart for details).     4:20 PM feels much improved after treatment with intravenous Benadryl and oral prednisone 7 PM patient asymptomatic.  Stable for discharge.  Plan patient instructed that she is allergic to lidocaine and should tell physicians.  Prescription EpiPen.  No further prescriptions needed.a cane injected locally today has metabolized. return cautions given Final Clinical Impressions(s) / ED Diagnoses  Diagnosis allergic reaction Final diagnoses:  None    ED Discharge Orders    None       Orlie Dakin, MD 12/19/17 1910

## 2017-12-19 NOTE — ED Notes (Signed)
Pt states she feels better, states she has mild itching on her head and lower legs, but its nothing like it was when she came in.

## 2017-12-19 NOTE — Discharge Instructions (Addendum)
Tell all of your doctors that you are allergic to lidocaine.  Use the EpiPen and return immediately if your condition worsens particularly if you develop symptoms of your throat closing, difficulty breathing or concern for any reason.

## 2018-01-06 ENCOUNTER — Other Ambulatory Visit: Payer: Self-pay

## 2018-01-06 NOTE — Patient Outreach (Addendum)
[facility]) Care Management  01/06/2018  TIEA MANNINEN 1956/01/07 [id number]  TELEPHONE SCREENING Referral date: 12/23/17 Referral source: [facility] utilization management Referral reason:  Medication assistance with epi pen Insurance: health team advantage  Telephone call to patient regarding  Utilization management referral. HIPAA verified with patient. Explained reason for call. Patient short and abrupt. Patient states, " I don't need to be referred to the pharmacist if they can't help me.  I don't need the pharmacist to tell me anything about my medications. I know about my medicines." RNCM explained to patient [facility] care management services. Explained that a [facility] pharmacist can follow up with her and discuss medication assistance options for the EPI pen. Patient states, " that's fine."  Patient states she has attempted to get the EPI pen on 3 different occasions but the cost was to expensive. Patient states her cost would have been $600 for the epi pen.  Patient states the ER doctor she had to have the epi pen because it was a matter of life or death.  RNCM offered to give patient [facility] contact information and 24 hour nurse line info. Patient decline stating, " they can just call me."  RNCM will mail [facility] brochure/ magnet to patient  PLAN: RNCM will refer patient to pharmacist for medication assistance.   Quinn Plowman RN,BSN,CCM [facility] Telephonic  [phone]

## 2018-01-07 ENCOUNTER — Other Ambulatory Visit: Payer: Self-pay

## 2018-01-07 DIAGNOSIS — F419 Anxiety disorder, unspecified: Secondary | ICD-10-CM | POA: Diagnosis not present

## 2018-01-07 DIAGNOSIS — R102 Pelvic and perineal pain: Secondary | ICD-10-CM | POA: Diagnosis not present

## 2018-01-07 DIAGNOSIS — M545 Low back pain: Secondary | ICD-10-CM | POA: Diagnosis not present

## 2018-01-07 DIAGNOSIS — I10 Essential (primary) hypertension: Secondary | ICD-10-CM | POA: Diagnosis not present

## 2018-01-07 NOTE — Patient Outreach (Signed)
[facility]) Care Management  01/07/2018  [name] Apr 17, 1956 [id number]   62 year old female referred to [place] [facility] by [facility] assistance for Epi Pen.  PMHx includes, but not limited to, asthma, GERD, fibromyalgia and generalized anxiety disorder.  Unsuccessful outreach attempt #1  to Ms. Douglass.  Left HIPAA compliant voice message requesting return call.    Plan: Outreach attempt #2 on Friday.  Will send unsuccessful outreach attempt letter.   Joetta Manners, PharmD Clinical Pharmacist [place] [phone]

## 2018-01-08 ENCOUNTER — Other Ambulatory Visit (HOSPITAL_COMMUNITY): Payer: Self-pay | Admitting: Pulmonary Disease

## 2018-01-08 ENCOUNTER — Other Ambulatory Visit: Payer: Self-pay

## 2018-01-08 DIAGNOSIS — R1084 Generalized abdominal pain: Secondary | ICD-10-CM

## 2018-01-08 DIAGNOSIS — R102 Pelvic and perineal pain: Secondary | ICD-10-CM

## 2018-01-08 NOTE — Patient Outreach (Signed)
[name]) Care Management  [place]   01/08/2018  [name] 21-Jan-1956 [id number]   62 year old female referred to [facility] by [place] [facility] assistance for Epi Pen.  PMHx includes, but not limited to, asthma, GERD, fibromyalgia and generalized anxiety disorder.  Subjective: [name] reports having at least three episodes of allergic reactions.  The last was after she received lidocaine for a dental procedure.  She states that she had seen an allergist and the only thing she was told she was allergic to was "cats."  She reports that she carries benadryl in her purse at all times in case of emergencies.  She states that she has tried several times to get an Epi Pen, but that they cost hundreds of dollars and she couldn't afford it.   Current Medications: Current Outpatient Medications  Medication Sig Dispense Refill  . ALPRAZolam (XANAX) 1 MG tablet Take 1 mg by mouth as needed.   0  . amLODipine (NORVASC) 5 MG tablet Take 5 mg by mouth daily.  0  . budesonide-formoterol (SYMBICORT) 160-4.5 MCG/ACT inhaler Inhale 2 puffs into the lungs 2 (two) times daily.    . Butalbital-APAP-Caffeine 50-300-40 MG CAPS Take 1 capsule by mouth as needed (for migraines). For headache  0  . fluticasone (FLONASE) 50 MCG/ACT nasal spray Place 1 spray into both nostrils 2 (two) times daily as needed for allergies or rhinitis. (Patient taking differently: Place 1 spray into both nostrils 2 (two) times daily as needed. ) 16 g 5  . furosemide (LASIX) 40 MG tablet Take 40 mg by mouth daily as needed for edema.     . IRON PO Take 1 tablet by mouth daily.    Marland Kitchen lubiprostone (AMITIZA) 24 MCG capsule Take 24 mcg by mouth at bedtime.     . MUCINEX 600 MG 12 hr tablet 1,200 mg 2 (two) times daily.   0  . pantoprazole (PROTONIX) 40 MG tablet Take 40 mg by mouth daily.    Marland Kitchen PARoxetine (PAXIL) 10 MG tablet Take 1 tablet (10 mg total) by mouth daily. (Patient taking differently:  Take 10 mg by mouth daily. Pt takes at bedtime) 30 tablet 6  . prenatal vitamin w/FE, FA (NATACHEW) 29-1 MG CHEW chewable tablet Chew 1 tablet by mouth daily at 12 noon.    Marland Kitchen PROAIR HFA 108 (90 Base) MCG/ACT inhaler inhale 2 puffs by mouth four times a day if needed  1  . traMADol (ULTRAM) 50 MG tablet Take 50 mg by mouth at bedtime.     . topiramate (TOPAMAX) 200 MG tablet Take 200 mg by mouth 2 (two) times daily. Pt discontinued on her own     No current facility-administered medications for this visit.    ASSESSMENT: Date Discharged from Hospital: 12/19/17 Date Medication Reconciliation Performed: 01/08/2018   New Medications Ordered at Discharge  Epi Pen  Patient was recently discharged from hospital and all medications have been reviewed  Drugs sorted by system:  Neurologic/Psychologic: alprazolam, paroxetine, topiramate,  Cardiovascular: amlodipine, furosemide  Pulmonary/Allergy: budesonide/formoterol inhaler, fluticasone, albuterol inhaler,    Gastrointestinal: lubiprostone, pantoprazole,   Pain: butalbital-APAP-caffeine, tramadol  Vitamins/Minerals: iron, vitamin,   Miscellaneous: guaifenesin  Other issues noted:  Patient report taking her paroxetine 1-2 times a week when she feels she needs it instead of scheduled.  I counseled her that this was not a prn medication.  Patient reports that she has stopped taking her topiramate without the doctor's knowledge.  She  states she has an appointment soon and will tell him.  Counseled her that she should not stop medications without her doctor's approval and that some medications should not be abruptly discontinued, therefore, she should always check with her doctor first.  Functional Status: No flowsheet data found.  Fall/Depression Screening: No flowsheet data found. No flowsheet data found.  Medication Assistance: [place] called [facility] and they report they have a 2 pack of Epi Pens ready for pick up for  $3.40.  Patient was very happy and reports that she will go pick them up.    Medication Management: Counseled patient that she should also have an H2 blocker like ranitidine or famotidine on hand in case she has another allergic reaction.  Counseled that these medications also work on histamine release that occurs during the reaction.  Plan: I informed patient that I will close her [facility] case since her medication assistance needs have been met.  She is aware she can call me back if future needs arise.  Route note and discipline closure letter to PCP, Dr. Luan Pulling.    Notify [facility] RN, Quinn Plowman of pharmacy case closure.  Joetta Manners, PharmD Clinical Pharmacist [place] [phone]

## 2018-01-10 ENCOUNTER — Ambulatory Visit: Payer: Self-pay

## 2018-01-14 ENCOUNTER — Ambulatory Visit (HOSPITAL_COMMUNITY)
Admission: RE | Admit: 2018-01-14 | Discharge: 2018-01-14 | Disposition: A | Discharge status: Home / Self Care | Payer: PPO | Source: Ambulatory Visit | Attending: Pulmonary Disease | Admitting: Pulmonary Disease

## 2018-01-14 DIAGNOSIS — R102 Pelvic and perineal pain: Secondary | ICD-10-CM

## 2018-01-14 DIAGNOSIS — R1084 Generalized abdominal pain: Secondary | ICD-10-CM

## 2018-01-14 DIAGNOSIS — M87851 Other osteonecrosis, right femur: Secondary | ICD-10-CM | POA: Diagnosis not present

## 2018-01-14 DIAGNOSIS — Q438 Other specified congenital malformations of intestine: Secondary | ICD-10-CM | POA: Insufficient documentation

## 2018-01-14 DIAGNOSIS — R1032 Left lower quadrant pain: Secondary | ICD-10-CM | POA: Diagnosis not present

## 2018-01-14 DIAGNOSIS — I700 Atherosclerosis of aorta: Secondary | ICD-10-CM | POA: Diagnosis not present

## 2018-01-14 DIAGNOSIS — M87852 Other osteonecrosis, left femur: Secondary | ICD-10-CM | POA: Diagnosis not present

## 2018-01-14 LAB — POCT I-STAT CREATININE: Creatinine, Ser: 0.9 mg/dL (ref 0.44–1.00)

## 2018-01-14 MED ORDER — IOPAMIDOL (ISOVUE-300) INJECTION 61%
100.0000 mL | Freq: Once | INTRAVENOUS | Status: AC | PRN
  Administered 2018-01-14: 100 mL via INTRAVENOUS

## 2018-01-30 ENCOUNTER — Encounter (HOSPITAL_COMMUNITY): Payer: Self-pay

## 2018-01-30 NOTE — Therapy (Signed)
[place], [place], [address] Phone: [phone]   Fax:  [phone]  Patient Details  Name: [name] MRN: [id number] Date of Birth: Apr 29, 1956 Referring Provider:  No ref. provider found  Encounter Date: 01/30/2018   PHYSICAL THERAPY DISCHARGE SUMMARY  Visits from Start of Care: 4  Current functional level related to goals / functional outcomes:  Patient is being discharged due to not returning since last visit. Last therapy session was on 11/18/17. She cancelled 5 appointments and requested to be discharged on 12/10/17. She had not participated in enough episodes of treatment for formal re-assessment. Below is impression from final visit.   Plan - 11/18/17 1346    Clinical Impression Statement  Patient arrived this session reporting significant amount of pain due to her fibromyalgia. Plan for start of session was to warm muscles up and begin postural training with UBE however patient was not willing to perform this exercise. Light exercises for ROM and stretching were performed alternatively and HEP was reviewed. She was able to complete all seated exercises with no discomfort and had mild discomfort in her neck with standing postural strengthening. She had a positive response to manual therapy reporting decreased headache and no neck pain at EOS. She will continue to benefit from skilled PT services to address impairments and improve     Remaining deficits: Goals status on 11/18/17 PT Short Term Goals - 11/12/17 1344            PT SHORT TERM GOAL #1   Title  Pt will be independent with HEP and perform consistently in order to decrease pain and maximize overall function.    Time  2    Period  Weeks    Status  On-going           PT Long Term Goals - 11/12/17 1344            PT LONG TERM GOAL #1   Title  Pt will have 10 deg improvement throughout all cervical AROM to demo improved soft tissue  length and decrease overall pain.    Time  4    Period  Weeks    Status  On-going        PT LONG TERM GOAL #2   Title  Pt will have improved lower trap and middle trap MMT to at least 4/5 to promote improved posture and scapular stabilization in order to decrease pain with work duties.    Time  4    Period  Weeks    Status  On-going        PT LONG TERM GOAL #3   Title  Pt will report minimal to no pain with palpation to L levator scap and L UT to demo improved soft tissue flexibility and extensibility and overall decreased pain.    Time  4    Period  Weeks    Status  On-going        PT LONG TERM GOAL #4   Title  Pt will report being able to perform all work duties during entire shift with minimal to no neck pain in order to demo improved overall functional strength and maximize return to PLOF.    Time  4    Period  Weeks    Status  TEFL teacher / Equipment: Patient had been educated on HEP and on prognosis of therapy as well as anticipated timeline.   Plan: Patient  agrees to discharge.  Patient goals were not met. Patient is being discharged due to the patient's request.  ?????      Kipp Brood, PT, DPT Physical Therapist with [facility]  01/30/2018 4:22 PM    [place], [place], [address] Phone: [phone]   Fax:  [phone]

## 2018-02-27 DIAGNOSIS — M79675 Pain in left toe(s): Secondary | ICD-10-CM | POA: Diagnosis not present

## 2018-02-27 DIAGNOSIS — M7742 Metatarsalgia, left foot: Secondary | ICD-10-CM | POA: Diagnosis not present

## 2018-04-10 DIAGNOSIS — J45909 Unspecified asthma, uncomplicated: Secondary | ICD-10-CM | POA: Diagnosis not present

## 2018-04-10 DIAGNOSIS — F419 Anxiety disorder, unspecified: Secondary | ICD-10-CM | POA: Diagnosis not present

## 2018-04-10 DIAGNOSIS — I10 Essential (primary) hypertension: Secondary | ICD-10-CM | POA: Diagnosis not present

## 2018-04-10 DIAGNOSIS — M25551 Pain in right hip: Secondary | ICD-10-CM | POA: Diagnosis not present

## 2018-04-10 DIAGNOSIS — M25561 Pain in right knee: Secondary | ICD-10-CM | POA: Diagnosis not present

## 2018-04-17 IMAGING — RF DG ESOPHAGUS
11 of 14 series · 14 of 24 positions shown · non-contrast
Comparison: None.

CLINICAL DATA: 61-year-old female with history of globus sensation
in the throat for the past 6 months. History of reflux.

EXAM:
ESOPHOGRAM / BARIUM SWALLOW / BARIUM TABLET STUDY
TECHNIQUE: Combined double contrast and single contrast examination performed
using effervescent crystals, thick barium liquid, and thin barium
liquid. The patient was observed with fluoroscopy swallowing a 13 mm
barium sulphate tablet.
FLUOROSCOPY TIME:  Fluoroscopy Time:  1 minutes 56 seconds
Radiation Exposure Index (if provided by the fluoroscopic device):
15.8 mGy

[Series 1: fluoro_barium 2fps_bw · 0.18mm/px · 2 of 16 frames shown (1 of 7)]
[frame 3/16]
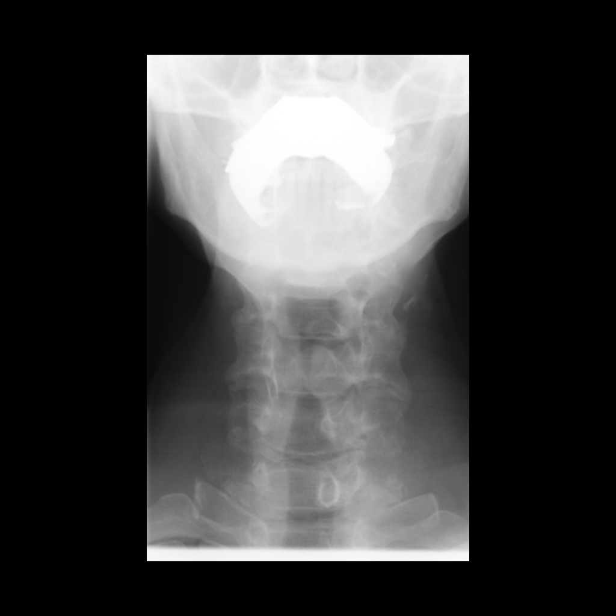
[frame 14/16]
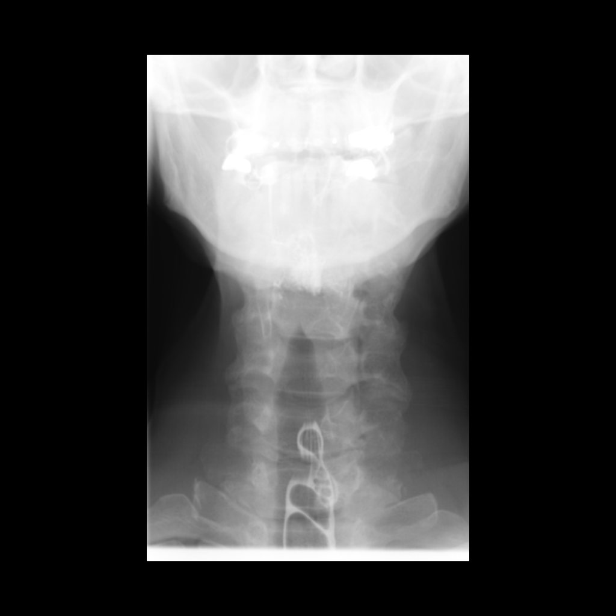

[Series 2: fluoro_barium 2fps_bw · 0.18mm/px · 1 of 20 frames shown (2 of 7)]
[frame 9/20]
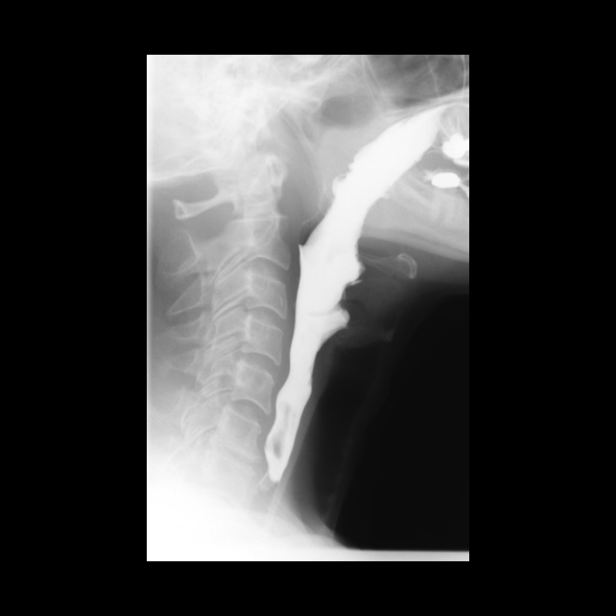

[Series 3: fluoro_barium 2fps_bw · 0.18mm/px · 1 of 1 slices shown (3 of 7)]
[im 1/1]
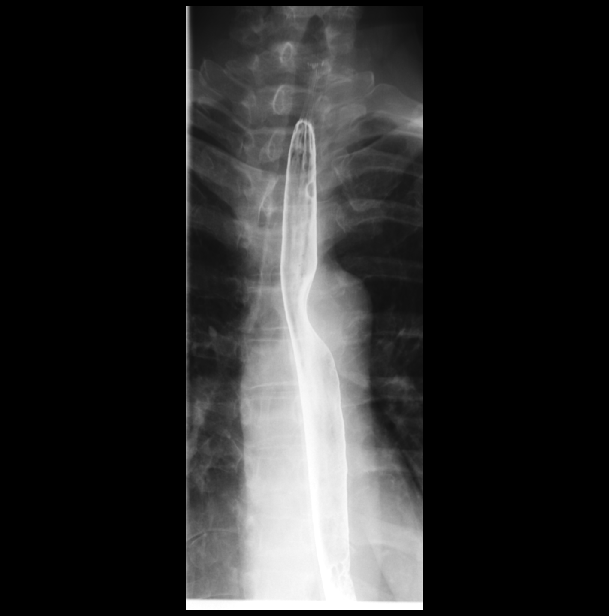

[Series 4: fluoro_barium 2fps_bw · 0.18mm/px · 1 of 1 slices shown (4 of 7)]
[im 1/1]
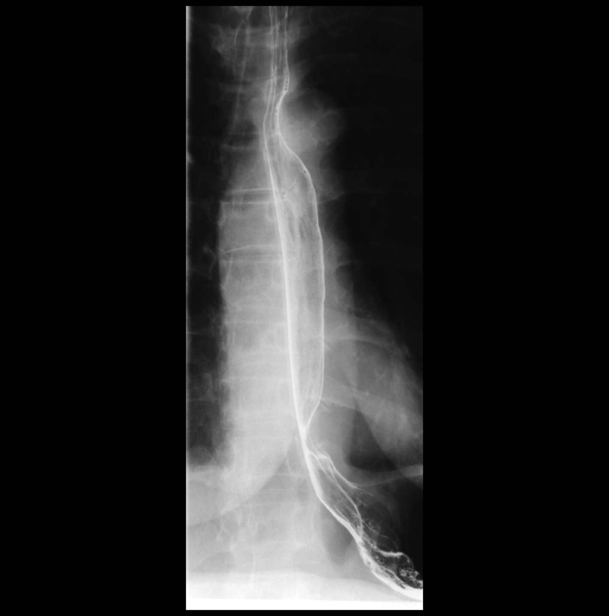

[Series 6: fluoro_barium 2fps_bw · 0.18mm/px · 1 of 1 slices shown (5 of 7)]
[im 1/1]
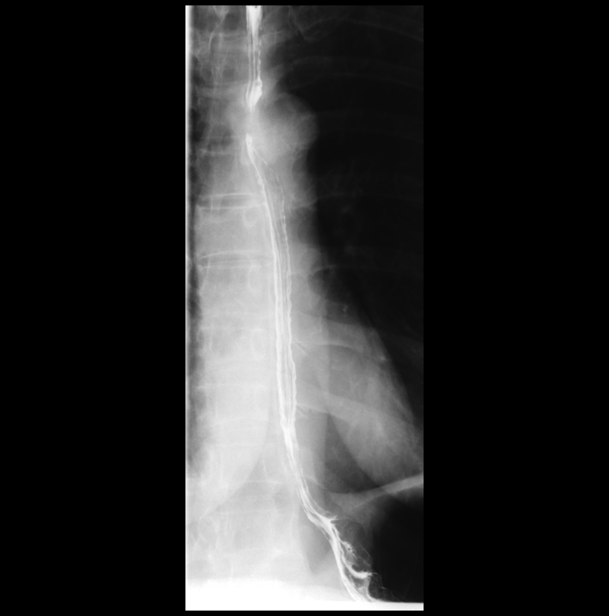

[Series 7: cp_standard · 0.26mm/px · 2 of 31 frames shown (1 of 4)]
[frame 22/31]
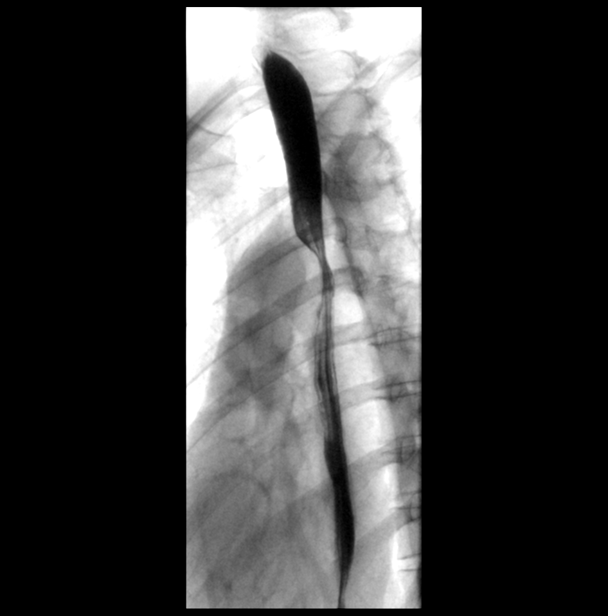
[frame 27/31]
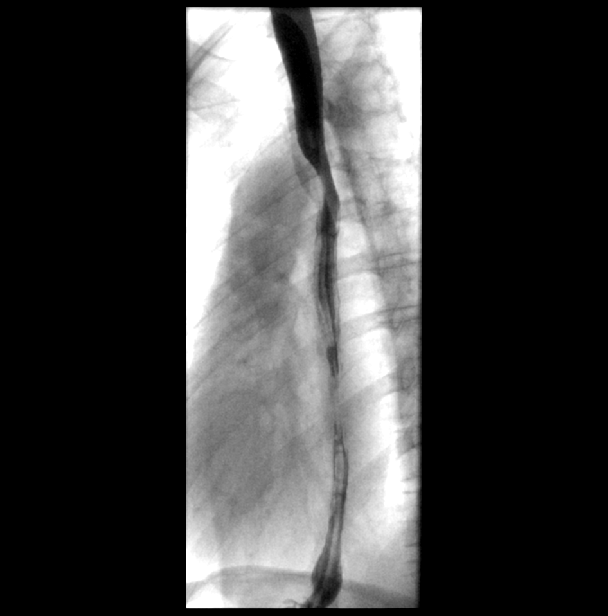

[Series 8: cp_standard · 0.27mm/px · 1 of 31 frames shown (2 of 4)]
[frame 16/31]
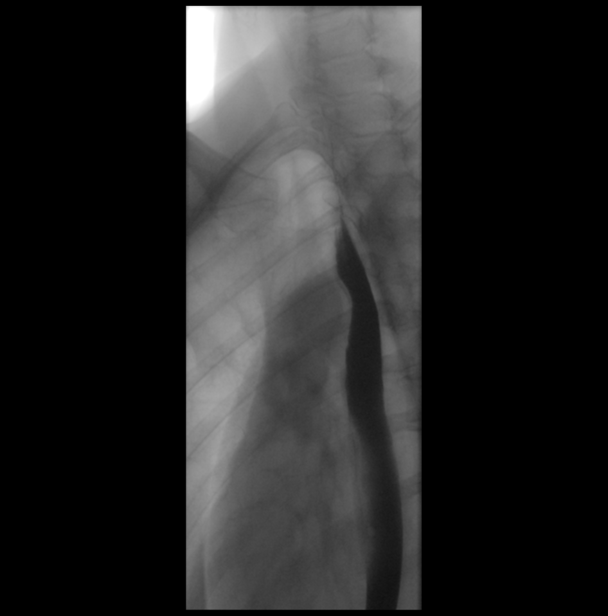

[Series 9: cp_standard · 0.27mm/px · 2 of 25 frames shown (3 of 4)]
[frame 4/25]
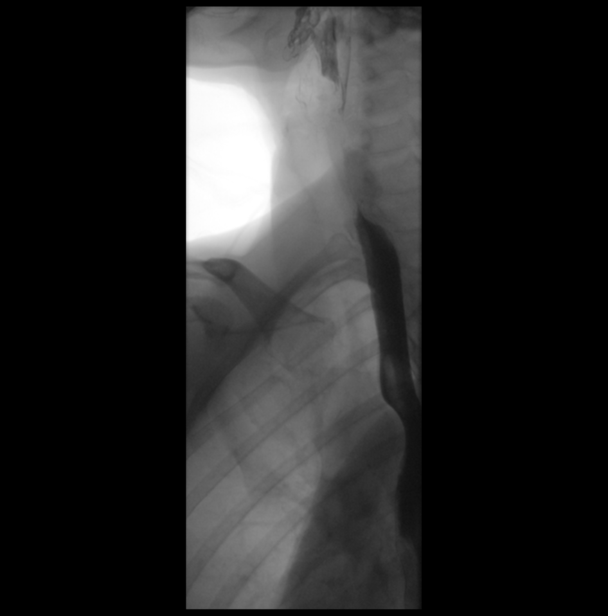
[frame 22/25]
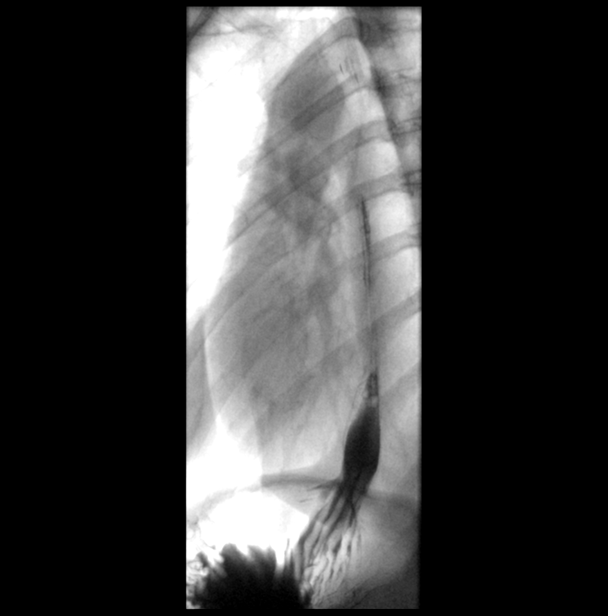

[Series 10: fluoro_barium 2fps_bw · 0.18mm/px · 1 of 1 slices shown (6 of 7)]
[im 1/1]
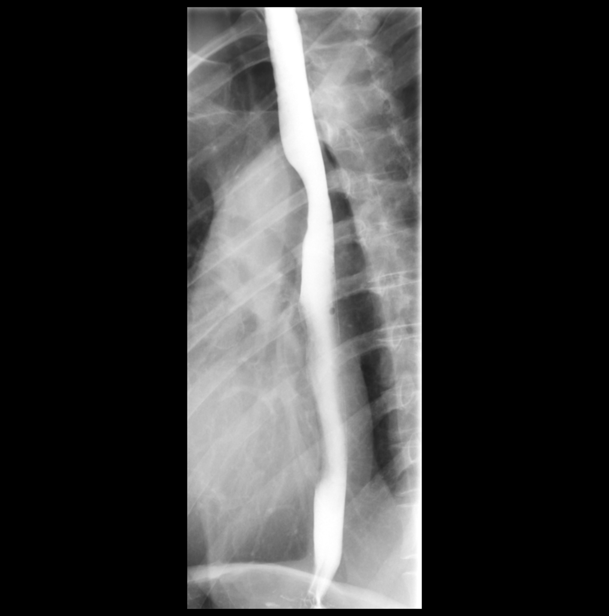

[Series 12: fluoro_barium 2fps_bw · 0.18mm/px · 1 of 1 slices shown (7 of 7)]
[im 1/1]
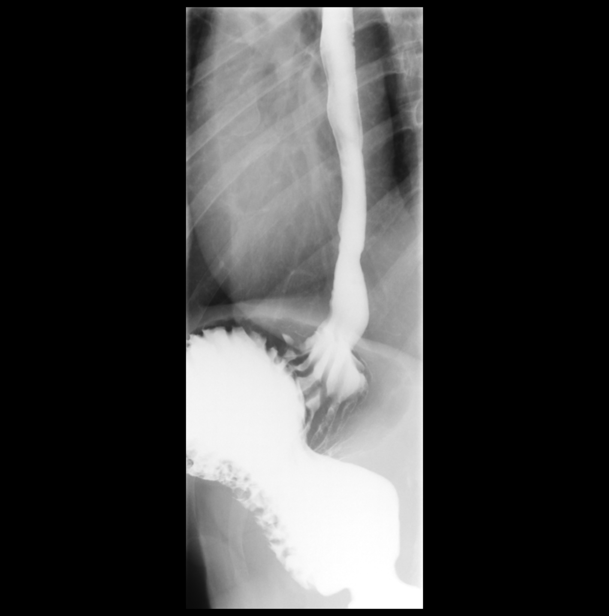

[Series 15: cp_standard · 0.25mm/px · 1 of 1 slices shown (4 of 4)]
[im 1/1]
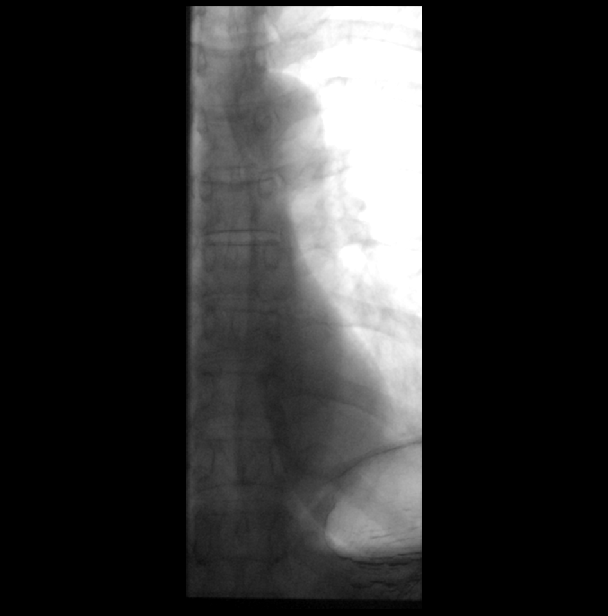

[14 of 24 positions shown; findings below may reference images not displayed]

FINDINGS: Rapid sequence images demonstrated normal oropharyngeal function.
Double contrast images demonstrated a normal appearance of the
esophageal mucosa. Multiple single swallow attempts were observed,
which demonstrated normal esophageal motility. Full column
esophagram demonstrated no esophageal mass, stricture or esophageal
ring. No hiatal hernia. Water siphon test demonstrated no
gastroesophageal reflux. A barium tablet was administered, which
passed readily into the stomach.
IMPRESSION: 1. Normal esophagram, as above.

## 2018-04-23 ENCOUNTER — Other Ambulatory Visit (HOSPITAL_COMMUNITY): Payer: Self-pay | Admitting: Pulmonary Disease

## 2018-04-23 DIAGNOSIS — M545 Low back pain: Secondary | ICD-10-CM

## 2018-04-28 DIAGNOSIS — L820 Inflamed seborrheic keratosis: Secondary | ICD-10-CM | POA: Diagnosis not present

## 2018-04-29 ENCOUNTER — Ambulatory Visit (HOSPITAL_COMMUNITY): Payer: PPO

## 2018-05-02 ENCOUNTER — Ambulatory Visit: Payer: PPO | Admitting: Orthopedic Surgery

## 2018-05-02 ENCOUNTER — Ambulatory Visit (INDEPENDENT_AMBULATORY_CARE_PROVIDER_SITE_OTHER): Payer: PPO

## 2018-05-02 ENCOUNTER — Encounter: Payer: Self-pay | Admitting: Orthopedic Surgery

## 2018-05-02 VITALS — BP 140/81 | HR 94 | Ht 68.0 in | Wt 153.0 lb

## 2018-05-02 DIAGNOSIS — G8929 Other chronic pain: Secondary | ICD-10-CM

## 2018-05-02 DIAGNOSIS — M25561 Pain in right knee: Secondary | ICD-10-CM

## 2018-05-02 DIAGNOSIS — M23321 Other meniscus derangements, posterior horn of medial meniscus, right knee: Secondary | ICD-10-CM | POA: Diagnosis not present

## 2018-05-02 NOTE — Progress Notes (Addendum)
New problem office visit, consultation Dr. Sinda Du  Chief Complaint  Patient presents with  . Knee Pain    right knee pain x 6 months     62 year old female presents for evaluation of right knee pain  Patient does not know how long the knee is been hurting but definitely a 83-month..  She complains of right knee pain medial side worse when she places her leg in figure-of-four position moderate in severity not improving no prior treatment no swelling in the joint seen   Review of Systems  Constitutional: Negative for malaise/fatigue and weight loss.  Musculoskeletal: Positive for back pain and joint pain.  Skin: Negative.   Neurological: Negative for tingling.     Past Medical History:  Diagnosis Date  . Abnormal Pap smear of cervix   . Asthma   . Fibromyalgia     Past Surgical History:  Procedure Laterality Date  . ADENOIDECTOMY    . APPENDECTOMY    . BUNIONECTOMY    . COLONOSCOPY  10/08/2011   Procedure: COLONOSCOPY;  Surgeon: Dorothyann Peng, MD;  Location: [facility];  Service: Endoscopy;  Laterality: N/A;  1:45 PM  . TONSILLECTOMY    . TUBAL LIGATION      Family History  Problem Relation Age of Onset  . Hypertension Mother   . Aneurysm Father   . Hypertension Father   . Cancer Brother   . COPD Brother   . Angioedema Neg Hx   . Atopy Neg Hx   . Eczema Neg Hx   . Immunodeficiency Neg Hx   . Urticaria Neg Hx   . Asthma Neg Hx   . Allergic rhinitis Neg Hx    Social History   Tobacco Use  . Smoking status: Former Smoker    Types: Cigarettes  . Smokeless tobacco: Never Used  Substance Use Topics  . Alcohol use: No  . Drug use: No    Allergies  Allergen Reactions  . Lidocaine Rash    Required ED visit  . Other     Unknown what is causing her to have allergic reactions.  Reaction started while at dentist receiving lidocaine 2% with epi.    Current Meds  Medication Sig  . ALPRAZolam (XANAX) 1 MG tablet Take 1 mg by mouth as needed.   Marland Kitchen  amLODipine (NORVASC) 5 MG tablet Take 5 mg by mouth daily.  . budesonide-formoterol (SYMBICORT) 160-4.5 MCG/ACT inhaler Inhale 2 puffs into the lungs 2 (two) times daily.  . Butalbital-APAP-Caffeine 50-300-40 MG CAPS Take 1 capsule by mouth as needed (for migraines). For headache  . fluticasone (FLONASE) 50 MCG/ACT nasal spray Place 1 spray into both nostrils 2 (two) times daily as needed for allergies or rhinitis. (Patient taking differently: Place 1 spray into both nostrils 2 (two) times daily as needed. )  . furosemide (LASIX) 40 MG tablet Take 40 mg by mouth daily as needed for edema.   . IRON PO Take 1 tablet by mouth daily.  Marland Kitchen lubiprostone (AMITIZA) 24 MCG capsule Take 24 mcg by mouth at bedtime.   . MUCINEX 600 MG 12 hr tablet 1,200 mg 2 (two) times daily.   . pantoprazole (PROTONIX) 40 MG tablet Take 40 mg by mouth daily.  . prenatal vitamin w/FE, FA (NATACHEW) 29-1 MG CHEW chewable tablet Chew 1 tablet by mouth daily at 12 noon.  Marland Kitchen PROAIR HFA 108 (90 Base) MCG/ACT inhaler inhale 2 puffs by mouth four times a day if needed  . topiramate (TOPAMAX)  200 MG tablet Take 200 mg by mouth 2 (two) times daily. Pt discontinued on her own  . traMADol (ULTRAM) 50 MG tablet Take 50 mg by mouth at bedtime.     BP 140/81   Pulse 94   Ht 5\' 8"  (1.727 m)   Wt 153 lb (69.4 kg)   LMP 12/13/2015   BMI 23.26 kg/m   Physical Exam  Constitutional: She is oriented to person, place, and time. She appears well-developed and well-nourished.  Musculoskeletal:       Right knee: She exhibits no effusion.  Neurological: She is alert and oriented to person, place, and time.  Psychiatric: She has a normal mood and affect. Judgment normal.  Vitals reviewed.   Right Knee Exam   Muscle Strength  The patient has normal right knee strength.  Tenderness  The patient is experiencing tenderness in the medial joint line.  Range of Motion  Extension: normal  Flexion: normal   Tests  McMurray:  Medial -  positive Lateral - negative Varus: negative Valgus: negative Drawer:  Anterior - negative    Posterior - negative  Other  Erythema: absent Scars: absent Sensation: normal Pulse: present Swelling: none Effusion: no effusion present   Left Knee Exam   Muscle Strength  The patient has normal left knee strength.  Tenderness  The patient is experiencing no tenderness.   Range of Motion  Extension: normal  Flexion: normal   Tests  McMurray:  Medial - negative Lateral - negative Varus: negative Valgus: negative Drawer:  Anterior - negative     Posterior - negative  Other  Erythema: absent Scars: absent Sensation: normal Pulse: present Swelling: none        MEDICAL DECISION SECTION  Xrays were done at [facility] orthopedics  My independent reading of xrays:  Mild arthritis right knee  Encounter Diagnoses  Name Primary?  . Chronic pain of right knee Yes  . Derangement of posterior horn of medial meniscus of right knee     PLAN: (Rx., injectx, surgery, frx, mri/ct) MRI right knee  No orders of the defined types were placed in this encounter.   Arther Abbott, MD  05/02/2018 12:59 PM

## 2018-05-05 ENCOUNTER — Ambulatory Visit (HOSPITAL_COMMUNITY)
Admission: RE | Admit: 2018-05-05 | Discharge: 2018-05-05 | Disposition: A | Discharge status: Home / Self Care | Payer: PPO | Source: Ambulatory Visit | Attending: Pulmonary Disease | Admitting: Pulmonary Disease

## 2018-05-05 DIAGNOSIS — M5126 Other intervertebral disc displacement, lumbar region: Secondary | ICD-10-CM | POA: Insufficient documentation

## 2018-05-05 DIAGNOSIS — M5136 Other intervertebral disc degeneration, lumbar region: Secondary | ICD-10-CM | POA: Insufficient documentation

## 2018-05-05 DIAGNOSIS — M48061 Spinal stenosis, lumbar region without neurogenic claudication: Secondary | ICD-10-CM | POA: Diagnosis not present

## 2018-05-05 DIAGNOSIS — M545 Low back pain: Secondary | ICD-10-CM | POA: Diagnosis not present

## 2018-05-05 DIAGNOSIS — M47896 Other spondylosis, lumbar region: Secondary | ICD-10-CM | POA: Diagnosis not present

## 2018-05-06 ENCOUNTER — Telehealth: Payer: Self-pay | Admitting: Radiology

## 2018-05-06 NOTE — Telephone Encounter (Signed)
Called patient about MRI scan 05/09/18 arrive at 130 for 2pm scan

## 2018-05-09 ENCOUNTER — Ambulatory Visit (HOSPITAL_COMMUNITY)
Admission: RE | Admit: 2018-05-09 | Discharge: 2018-05-09 | Disposition: A | Discharge status: Home / Self Care | Payer: PPO | Source: Ambulatory Visit | Attending: Orthopedic Surgery | Admitting: Orthopedic Surgery

## 2018-05-09 DIAGNOSIS — M25561 Pain in right knee: Secondary | ICD-10-CM | POA: Diagnosis not present

## 2018-05-09 DIAGNOSIS — G8929 Other chronic pain: Secondary | ICD-10-CM | POA: Diagnosis not present

## 2018-05-09 DIAGNOSIS — M23321 Other meniscus derangements, posterior horn of medial meniscus, right knee: Secondary | ICD-10-CM | POA: Diagnosis not present

## 2018-05-09 DIAGNOSIS — S86911A Strain of unspecified muscle(s) and tendon(s) at lower leg level, right leg, initial encounter: Secondary | ICD-10-CM | POA: Insufficient documentation

## 2018-05-09 DIAGNOSIS — X58XXXA Exposure to other specified factors, initial encounter: Secondary | ICD-10-CM | POA: Insufficient documentation

## 2018-05-13 ENCOUNTER — Other Ambulatory Visit: Payer: Self-pay | Admitting: Pulmonary Disease

## 2018-05-13 DIAGNOSIS — M545 Low back pain: Principal | ICD-10-CM

## 2018-05-13 DIAGNOSIS — G8929 Other chronic pain: Secondary | ICD-10-CM

## 2018-05-16 ENCOUNTER — Ambulatory Visit: Payer: PPO | Admitting: Orthopedic Surgery

## 2018-05-16 VITALS — BP 130/80 | HR 73 | Ht 68.0 in | Wt 153.0 lb

## 2018-05-16 DIAGNOSIS — G8929 Other chronic pain: Secondary | ICD-10-CM | POA: Diagnosis not present

## 2018-05-16 DIAGNOSIS — M23321 Other meniscus derangements, posterior horn of medial meniscus, right knee: Secondary | ICD-10-CM | POA: Diagnosis not present

## 2018-05-16 DIAGNOSIS — M25561 Pain in right knee: Secondary | ICD-10-CM

## 2018-05-16 DIAGNOSIS — Z23 Encounter for immunization: Secondary | ICD-10-CM | POA: Diagnosis not present

## 2018-05-16 NOTE — Progress Notes (Signed)
FOLLOW UP VISIT : MRI RESULTS   Chief Complaint  Patient presents with  . Follow-up    Recheck on right knee, MRI results.     HPI: The patient is here TO DISCUSS THE RESULTS OF MRI  62 year old female had an MRI of her right knee she complains of medial knee pain especially when going to figure-of-four position she says it kills me.  The pain is been present now for several weeks if not months and has not improved.  She locates the pain over the medial joint line she has a clicking sensation is associated with intermittent swelling and occasionally trouble bending the knee into deep flexion   Review of Systems  Constitutional: Negative for chills and fever.  Neurological: Positive for sensory change. Negative for tingling.       History of fibromyalgia   Past Medical History:  Diagnosis Date  . Abnormal Pap smear of cervix   . Asthma   . Fibromyalgia      BP 130/80   Pulse 73   Ht 5\' 8"  (1.727 m)   Wt 153 lb (69.4 kg)   LMP 12/13/2015   BMI 23.26 kg/m     Medical decision-making section   DATA  MRI REPORT:   CLINICAL DATA:  Mechanical knee symptoms. Catching, locking, snapping and crepitus   EXAM: MRI OF THE RIGHT KNEE WITHOUT CONTRAST   TECHNIQUE: Multiplanar, multisequence MR imaging of the knee was performed. No intravenous contrast was administered.   COMPARISON:  None.   FINDINGS: MENISCI   Medial meniscus: Possible small undersurface vertical tear at the periphery of the body of the medial meniscus. Otherwise no medial meniscal tear.   Lateral meniscus:  Intact.   LIGAMENTS   Cruciates:  Intact ACL and PCL.   Collaterals: Medial collateral ligament is intact. Lateral collateral ligament complex is intact.   CARTILAGE   Patellofemoral:  No chondral defect.   Medial:  No chondral defect.   Lateral:  No chondral defect.   Joint:  No joint effusion. Normal Hoffa's fat. No plical thickening.   Popliteal Fossa:  Small Baker's cyst.   Intact popliteus tendon.   Extensor Mechanism: Intact quadriceps tendon. Intact patellar tendon. Intact medial patellar retinaculum. Intact lateral patellar retinaculum. Intact MPFL.   Bones:  No acute osseous abnormality.  No aggressive osseous lesion.   Other: No fluid collection or hematoma. Tendinosis at the origin of the medial gastrocnemius tendon with a small partial tear.   IMPRESSION: 1. Possible small undersurface vertical tear at the periphery of the body of the medial meniscus. Otherwise no medial meniscal tear. 2. Tendinosis at the origin of the medial gastrocnemius tendon with a small partial tear.     Electronically Signed   By: Kathreen Devoid   On: 05/09/2018 14:50   MY READING: MRI OF THE my reading of the MRI is that she has an undersurface medial meniscal tear no significant degenerative changes are seen in fact no degenerative changes are seen.   Encounter Diagnoses  Name Primary?  . Chronic pain of right knee   . Derangement of posterior horn of medial meniscus of right knee Yes     PLAN: She has several issues going on with her overall health.  Podiatry is planning surgery on her foot they would like her knee done first and I agree.  I told her the best way to find out if this is indeed a tear as it was not very clear on MRI is to  go ahead and scope the knee repair the meniscus if it is repairable do a partial meniscectomy if it is not and if there is no tear then she has to live with the problem  She said she will call us back  So we decided to give her an injection  Procedure note right knee injection verbal consent was obtained to inject right knee joint  Timeout was completed to confirm the site of injection  The medications used were 40 mg of Depo-Medrol and 1% lidocaine 3 cc  Anesthesia was provided by ethyl chloride and the skin was prepped with alcohol.  After cleaning the skin with alcohol a 20-gauge needle was used to inject the right  knee joint. There were no complications. A sterile bandage was applied.

## 2018-05-20 ENCOUNTER — Other Ambulatory Visit: Payer: Self-pay | Admitting: Orthopedic Surgery

## 2018-05-20 ENCOUNTER — Telehealth: Payer: Self-pay | Admitting: Orthopedic Surgery

## 2018-05-20 HISTORY — PX: KNEE SURGERY: SHX244

## 2018-05-20 NOTE — Telephone Encounter (Signed)
Patient called to speak with Amy, per office visit 05/16/18. States would like to discuss surgery, and prefers a Thursday at end of October or early November.  Ph# [phone]

## 2018-05-20 NOTE — Telephone Encounter (Signed)
She has requested [place], orders placed you should get them to sign.   FYI

## 2018-05-27 ENCOUNTER — Ambulatory Visit
Admission: RE | Admit: 2018-05-27 | Discharge: 2018-05-27 | Disposition: A | Discharge status: Home / Self Care | Payer: PPO | Source: Ambulatory Visit | Attending: Pulmonary Disease | Admitting: Pulmonary Disease

## 2018-05-27 DIAGNOSIS — M545 Low back pain: Principal | ICD-10-CM

## 2018-05-27 DIAGNOSIS — G8929 Other chronic pain: Secondary | ICD-10-CM

## 2018-05-27 DIAGNOSIS — M47816 Spondylosis without myelopathy or radiculopathy, lumbar region: Secondary | ICD-10-CM | POA: Diagnosis not present

## 2018-05-27 MED ORDER — METHYLPREDNISOLONE ACETATE 40 MG/ML INJ SUSP (RADIOLOG
120.0000 mg | Freq: Once | INTRAMUSCULAR | Status: AC
  Administered 2018-05-27: 120 mg via EPIDURAL

## 2018-05-27 MED ORDER — IOPAMIDOL (ISOVUE-M 200) INJECTION 41%
1.0000 mL | Freq: Once | INTRAMUSCULAR | Status: AC
  Administered 2018-05-27: 1 mL via EPIDURAL

## 2018-05-27 NOTE — Discharge Instructions (Signed)

## 2018-06-09 NOTE — Patient Instructions (Signed)
[name]  06/09/2018     @PREFPERIOPPHARMACY @   Your procedure is scheduled on  06/19/2018   Report to [name] at  845   A.M.  Call this number if you have problems the morning of surgery:  [phone]   Remember:  Do not eat or drink after midnight.                          Take these medicines the morning of surgery with A SIP OF WATER  Xanax ( if needed), amlodipine, protonix, topamax.    Do not wear jewelry, make-up or nail polish.  Do not wear lotions, powders, or perfumes, or deodorant.  Do not shave 48 hours prior to surgery.  Men may shave face and neck.  Do not bring valuables to the hospital.  [facility] is not responsible for any belongings or valuables.  Contacts, dentures or bridgework may not be worn into surgery.  Leave your suitcase in the car.  After surgery it may be brought to your room.  For patients admitted to the hospital, discharge time will be determined by your treatment team.  Patients discharged the day of surgery will not be allowed to drive home.   Name and phone number of your driver:   family Special instructions:  None  Please read over the following fact sheets that you were given. Anesthesia Post-op Instructions and Care and Recovery After Surgery      Knee Ligament Injury, Arthroscopy Arthroscopy is a surgical technique in which your health care provider examines your knee through a small, pencil-sized telescope (arthroscope). Often, repairs to injured ligaments can be done with instruments in the arthroscope. Arthroscopy is less invasive than open-knee surgery. Tell a health care provider about:  Any allergies you have.  All medicines you are taking, including vitamins, herbs, eye drops, creams, and over-the-counter medicines.  Any problems you or family members have had with anesthetic medicines.  Any blood disorders you have.  Any surgeries you have had.  Any medical conditions you  have. What are the risks? Generally, this is a safe procedure. However, as with any procedure, problems can occur. Possible problems include:  Infection.  Bleeding.  Stiffness.  What happens before the procedure?  Ask your health care provider about changing or stopping any regular medicines. Avoid taking aspirin or blood thinners as directed by your health care provider.  Do not eat or drink anything after midnight the night before surgery.  If you smoke, do not smoke for at least 2 weeks before your surgery.  Do not drink alcohol starting the day before your surgery.  Let your health care provider know if you develop a cold or any infection before your surgery.  Arrange for someone to drive you home after the surgery or after your hospital stay. Also arrange for someone to help you with activities during recovery. What happens during the procedure?  Small monitors will be put on your body. They are used to check your heart, blood pressure, and oxygen levels.  An IV access tube will be put into one of your veins. Medicine will be able to flow directly into your body through this IV tube.  You might be given a medicine to help you relax (sedative).  You will be given a medicine that makes you go to sleep (general anesthetic), and a breathing tube will be placed into your  lungs during the procedure.  Several small incisions are made in your knee. Saline fluid is placed into one of the incisions to expand the knee and clear away any blood in the knee.  Your health care provider will insert the arthroscope to examine the injured knee.  During arthroscopy, your health care provider may find a partial or complete tear in a ligament.  Tools can be inserted through the other incisions to repair the injured ligaments.  The incisions are then closed with absorbable stitches and covered with dressings. What happens after the procedure?  You will be taken to the recovery area where  you will be monitored.  When you are awake, stable, and taking fluids without problems, you will be allowed to go home. This information is not intended to replace advice given to you by your health care provider. Make sure you discuss any questions you have with your health care provider. Document Released: 08/03/2000 Document Revised: 01/12/2016 Document Reviewed: 03/18/2013 Elsevier Interactive Patient Education  2017 [place].  Arthroscopic Knee Ligament Repair, Care After This sheet gives you information about how to care for yourself after your procedure. Your health care provider may also give you more specific instructions. If you have problems or questions, contact your health care provider. What can I expect after the procedure? After the procedure, it is common to have:  Pain in your knee.  Bruising and swelling on your knee, calf, and ankle for 3-4 days.  Fatigue.  Follow these instructions at home: If you have a brace or immobilizer:  Wear the brace or immobilizer as told by your health care provider. Remove it only as told by your health care provider.  Loosen the splint or immobilizer if your toes tingle, become numb, or turn cold and blue.  Keep the brace or immobilizer clean. Bathing  Do not take baths, swim, or use a hot tub until your health care provider approves. Ask your health care provider if you can take showers.  Keep your bandage (dressing) dry until your health care provider says that it can be removed. Cover it and your brace or immobilizer with a watertight covering when you take a shower. Incision care  Follow instructions from your health care provider about how to take care of your incision. Make sure you: ? Wash your hands with soap and water before you change your bandage (dressing). If soap and water are not available, use hand sanitizer. ? Change your dressing as told by your health care provider. ? Leave stitches (sutures), skin glue, or  adhesive strips in place. These skin closures may need to stay in place for 2 weeks or longer. If adhesive strip edges start to loosen and curl up, you may trim the loose edges. Do not remove adhesive strips completely unless your health care provider tells you to do that.  Check your incision area every day for signs of infection. Check for: ? More redness, swelling, or pain. ? More fluid or blood. ? Warmth. ? Pus or a bad smell. Managing pain, stiffness, and swelling  If directed, put ice on the affected area. ? If you have a removable brace or immobilizer, remove it as told by your health care provider. ? Put ice in a plastic bag. ? Place a towel between your skin and the bag or between your brace or immobilizer and the bag. ? Leave the ice on for 20 minutes, 2-3 times a day.  Move your toes often to avoid stiffness and  to lessen swelling.  Raise (elevate) the injured area above the level of your heart while you are sitting or lying down. Driving  Do not drive until your health care provider approves. If you have a brace or immobilizer on your leg, ask your health care provider when it is safe for you to drive.  Do not drive or use heavy machinery while taking prescription pain medicine. Activity  Rest as directed. Ask your health care provider what activities are safe for you.  Do physical therapy exercises as told by your health care provider. Physical therapy will help you regain strength and motion in your knee.  Follow instructions from your health care provider about: ? When you may start motion exercises. ? When you may start riding a stationary bike and doing other low-impact activities. ? When you may start to jog and do other high-impact activities. Safety  Do not use the injured limb to support your body weight until your health care provider says that you can. Use crutches as told by your health care provider. General instructions  Do not use any products that  contain nicotine or tobacco, such as cigarettes and e-cigarettes. These can delay bone healing. If you need help quitting, ask your health care provider.  To prevent or treat constipation while you are taking prescription pain medicine, your health care provider may recommend that you: ? Drink enough fluid to keep your urine clear or pale yellow. ? Take over-the-counter or prescription medicines. ? Eat foods that are high in fiber, such as fresh fruits and vegetables, whole grains, and beans. ? Limit foods that are high in fat and processed sugars, such as fried and sweet foods.  Take over-the-counter and prescription medicines only as told by your health care provider.  Keep all follow-up visits as told by your health care provider. This is important. Contact a health care provider if:  You have more redness, swelling, or pain around an incision.  You have more fluid or blood coming from an incision.  Your incision feels warm to the touch.  You have a fever.  You have pain or swelling in your knee, and it gets worse.  You have pain that does not get better with medicine. Get help right away if:  You have trouble breathing.  You have pus or a bad smell coming from an incision.  You have numbness and tingling near the knee joint. Summary  After the procedure, it is common to have knee pain with bruising and swelling on your knee, calf, and ankle.  Icing your knee and raising your leg above the level of your heart will help control the pain and the swelling.  Do physical therapy exercises as told by your health care provider. Physical therapy will help you regain strength and motion in your knee. This information is not intended to replace advice given to you by your health care provider. Make sure you discuss any questions you have with your health care provider. Document Released: 05/27/2013 Document Revised: 07/31/2016 Document Reviewed: 07/31/2016 Elsevier Interactive  Patient Education  2017 [place] Anesthesia, Adult, Care After These instructions provide you with information about caring for yourself after your procedure. Your health care provider may also give you more specific instructions. Your treatment has been planned according to current medical practices, but problems sometimes occur. Call your health care provider if you have any problems or questions after your procedure. What can I expect after the procedure? After the procedure, it  is common to have:  Vomiting.  A sore throat.  Mental slowness.  It is common to feel:  Nauseous.  Cold or shivery.  Sleepy.  Tired.  Sore or achy, even in parts of your body where you did not have surgery.  Follow these instructions at home: For at least 24 hours after the procedure:  Do not: ? Participate in activities where you could fall or become injured. ? Drive. ? Use heavy machinery. ? Drink alcohol. ? Take sleeping pills or medicines that cause drowsiness. ? Make important decisions or sign legal documents. ? Take care of children on your own.  Rest. Eating and drinking  If you vomit, drink water, juice, or soup when you can drink without vomiting.  Drink enough fluid to keep your urine clear or pale yellow.  Make sure you have little or no nausea before eating solid foods.  Follow the diet recommended by your health care provider. General instructions  Have a responsible adult stay with you until you are awake and alert.  Return to your normal activities as told by your health care provider. Ask your health care provider what activities are safe for you.  Take over-the-counter and prescription medicines only as told by your health care provider.  If you smoke, do not smoke without supervision.  Keep all follow-up visits as told by your health care provider. This is important. Contact a health care provider if:  You continue to have nausea or vomiting at  home, and medicines are not helpful.  You cannot drink fluids or start eating again.  You cannot urinate after 8-12 hours.  You develop a skin rash.  You have fever.  You have increasing redness at the site of your procedure. Get help right away if:  You have difficulty breathing.  You have chest pain.  You have unexpected bleeding.  You feel that you are having a life-threatening or urgent problem. This information is not intended to replace advice given to you by your health care provider. Make sure you discuss any questions you have with your health care provider. Document Released: 11/12/2000 Document Revised: 01/09/2016 Document Reviewed: 07/21/2015 Elsevier Interactive Patient Education  2018 [place] Anesthesia, Adult, Care After These instructions provide you with information about caring for yourself after your procedure. Your health care provider may also give you more specific instructions. Your treatment has been planned according to current medical practices, but problems sometimes occur. Call your health care provider if you have any problems or questions after your procedure. What can I expect after the procedure? After the procedure, it is common to have:  Vomiting.  A sore throat.  Mental slowness.  It is common to feel:  Nauseous.  Cold or shivery.  Sleepy.  Tired.  Sore or achy, even in parts of your body where you did not have surgery.  Follow these instructions at home: For at least 24 hours after the procedure:  Do not: ? Participate in activities where you could fall or become injured. ? Drive. ? Use heavy machinery. ? Drink alcohol. ? Take sleeping pills or medicines that cause drowsiness. ? Make important decisions or sign legal documents. ? Take care of children on your own.  Rest. Eating and drinking  If you vomit, drink water, juice, or soup when you can drink without vomiting.  Drink enough fluid to keep your  urine clear or pale yellow.  Make sure you have little or no nausea before eating solid foods.  Follow the diet recommended by your health care provider. General instructions  Have a responsible adult stay with you until you are awake and alert.  Return to your normal activities as told by your health care provider. Ask your health care provider what activities are safe for you.  Take over-the-counter and prescription medicines only as told by your health care provider.  If you smoke, do not smoke without supervision.  Keep all follow-up visits as told by your health care provider. This is important. Contact a health care provider if:  You continue to have nausea or vomiting at home, and medicines are not helpful.  You cannot drink fluids or start eating again.  You cannot urinate after 8-12 hours.  You develop a skin rash.  You have fever.  You have increasing redness at the site of your procedure. Get help right away if:  You have difficulty breathing.  You have chest pain.  You have unexpected bleeding.  You feel that you are having a life-threatening or urgent problem. This information is not intended to replace advice given to you by your health care provider. Make sure you discuss any questions you have with your health care provider. Document Released: 11/12/2000 Document Revised: 01/09/2016 Document Reviewed: 07/21/2015 Elsevier Interactive Patient Education  [employer].

## 2018-06-12 ENCOUNTER — Telehealth: Payer: Self-pay | Admitting: Radiology

## 2018-06-12 NOTE — Telephone Encounter (Signed)
Faxed request for prior authorization to [employer] for insurance approval for her upcoming knee scope.

## 2018-06-16 ENCOUNTER — Other Ambulatory Visit: Payer: Self-pay

## 2018-06-16 ENCOUNTER — Encounter (HOSPITAL_COMMUNITY): Payer: Self-pay

## 2018-06-16 ENCOUNTER — Encounter (HOSPITAL_COMMUNITY)
Admission: RE | Admit: 2018-06-16 | Discharge: 2018-06-16 | Disposition: A | Discharge status: Home / Self Care | Payer: PPO | Source: Ambulatory Visit | Attending: Orthopedic Surgery | Admitting: Orthopedic Surgery

## 2018-06-16 DIAGNOSIS — F419 Anxiety disorder, unspecified: Secondary | ICD-10-CM | POA: Diagnosis not present

## 2018-06-16 DIAGNOSIS — M25561 Pain in right knee: Secondary | ICD-10-CM

## 2018-06-16 DIAGNOSIS — S83511A Sprain of anterior cruciate ligament of right knee, initial encounter: Secondary | ICD-10-CM | POA: Diagnosis not present

## 2018-06-16 DIAGNOSIS — M23321 Other meniscus derangements, posterior horn of medial meniscus, right knee: Secondary | ICD-10-CM

## 2018-06-16 DIAGNOSIS — J45909 Unspecified asthma, uncomplicated: Secondary | ICD-10-CM | POA: Diagnosis not present

## 2018-06-16 DIAGNOSIS — X58XXXA Exposure to other specified factors, initial encounter: Secondary | ICD-10-CM | POA: Diagnosis not present

## 2018-06-16 DIAGNOSIS — F329 Major depressive disorder, single episode, unspecified: Secondary | ICD-10-CM | POA: Diagnosis not present

## 2018-06-16 DIAGNOSIS — Z79899 Other long term (current) drug therapy: Secondary | ICD-10-CM | POA: Diagnosis not present

## 2018-06-16 DIAGNOSIS — Z87891 Personal history of nicotine dependence: Secondary | ICD-10-CM | POA: Diagnosis not present

## 2018-06-16 DIAGNOSIS — M94261 Chondromalacia, right knee: Secondary | ICD-10-CM | POA: Diagnosis not present

## 2018-06-16 DIAGNOSIS — K219 Gastro-esophageal reflux disease without esophagitis: Secondary | ICD-10-CM | POA: Diagnosis not present

## 2018-06-16 DIAGNOSIS — R9431 Abnormal electrocardiogram [ECG] [EKG]: Secondary | ICD-10-CM

## 2018-06-16 DIAGNOSIS — M67261 Synovial hypertrophy, not elsewhere classified, right lower leg: Secondary | ICD-10-CM | POA: Diagnosis not present

## 2018-06-16 DIAGNOSIS — Z7951 Long term (current) use of inhaled steroids: Secondary | ICD-10-CM | POA: Diagnosis not present

## 2018-06-16 DIAGNOSIS — Z01818 Encounter for other preprocedural examination: Secondary | ICD-10-CM | POA: Insufficient documentation

## 2018-06-16 DIAGNOSIS — G8929 Other chronic pain: Secondary | ICD-10-CM

## 2018-06-16 DIAGNOSIS — Z8249 Family history of ischemic heart disease and other diseases of the circulatory system: Secondary | ICD-10-CM | POA: Diagnosis not present

## 2018-06-16 DIAGNOSIS — M1711 Unilateral primary osteoarthritis, right knee: Secondary | ICD-10-CM | POA: Diagnosis not present

## 2018-06-16 HISTORY — DX: Personal history of urinary calculi: Z87.442

## 2018-06-16 HISTORY — DX: Gastro-esophageal reflux disease without esophagitis: K21.9

## 2018-06-16 LAB — CBC WITH DIFFERENTIAL/PLATELET
Abs Immature Granulocytes: 0.02 10*3/uL (ref 0.00–0.07)
BASOS PCT: 1 %
Basophils Absolute: 0.1 10*3/uL (ref 0.0–0.1)
EOS ABS: 0.3 10*3/uL (ref 0.0–0.5)
EOS PCT: 5 %
HCT: 43.1 % (ref 36.0–46.0)
Hemoglobin: 13.1 g/dL (ref 12.0–15.0)
Immature Granulocytes: 0 %
Lymphocytes Relative: 21 %
Lymphs Abs: 1.4 10*3/uL (ref 0.7–4.0)
MCH: 28.7 pg (ref 26.0–34.0)
MCHC: 30.4 g/dL (ref 30.0–36.0)
MCV: 94.3 fL (ref 80.0–100.0)
Monocytes Absolute: 0.5 10*3/uL (ref 0.1–1.0)
Monocytes Relative: 7 %
NRBC: 0 % (ref 0.0–0.2)
Neutro Abs: 4.4 10*3/uL (ref 1.7–7.7)
Neutrophils Relative %: 66 %
PLATELETS: 331 10*3/uL (ref 150–400)
RBC: 4.57 MIL/uL (ref 3.87–5.11)
RDW: 13.4 % (ref 11.5–15.5)
WBC: 6.7 10*3/uL (ref 4.0–10.5)

## 2018-06-16 LAB — BASIC METABOLIC PANEL
ANION GAP: 8 (ref 5–15)
BUN: 11 mg/dL (ref 8–23)
CALCIUM: 9 mg/dL (ref 8.9–10.3)
CO2: 21 mmol/L — ABNORMAL LOW (ref 22–32)
CREATININE: 0.98 mg/dL (ref 0.44–1.00)
Chloride: 109 mmol/L (ref 98–111)
GFR calc Af Amer: >60 mL/min (ref >60)
GFR calc non Af Amer: >60 mL/min (ref >60)
Glucose, Bld: 101 mg/dL — ABNORMAL HIGH (ref 70–99)
Potassium: 3.8 mmol/L (ref 3.5–5.1)
SODIUM: 138 mmol/L (ref 135–145)

## 2018-06-18 NOTE — H&P (Signed)
Chief Complaint  Patient presents with  . Follow-up      Recheck on right knee, MRI results.        HPI: The patient is here TO DISCUSS THE RESULTS OF MRI   62 year old female had an MRI of her right knee she complains of medial knee pain especially when going to figure-of-four position she says it kills me.  The pain is been present now for several weeks if not months and has not improved.  She locates the pain over the medial joint line she has a clicking sensation is associated with intermittent swelling and occasionally trouble bending the knee into deep flexion     Review of Systems  Constitutional: Negative for chills and fever.  Neurological: Positive for sensory change. Negative for tingling.       History of fibromyalgia        Past Medical History:  Diagnosis Date  . Abnormal Pap smear of cervix    . Asthma    . Fibromyalgia        Past Surgical History:  Procedure Laterality Date  . ADENOIDECTOMY    . APPENDECTOMY    . BUNIONECTOMY Bilateral   . COLONOSCOPY  10/08/2011   Procedure: COLONOSCOPY;  Surgeon: Dorothyann Peng, MD;  Location: [facility];  Service: Endoscopy;  Laterality: N/A;  1:45 PM  . TONSILLECTOMY    . TUBAL LIGATION     Social History   Tobacco Use  . Smoking status: Former Smoker    Packs/day: 0.25    Years: 1.00    Pack years: 0.25    Types: Cigarettes    Last attempt to quit: 06/17/1975    Years since quitting: 43.0  . Smokeless tobacco: Never Used  Substance Use Topics  . Alcohol use: No  . Drug use: No   Family History  Problem Relation Age of Onset  . Hypertension Mother   . Aneurysm Father   . Hypertension Father   . Cancer Brother   . COPD Brother   . Angioedema Neg Hx   . Atopy Neg Hx   . Eczema Neg Hx   . Immunodeficiency Neg Hx   . Urticaria Neg Hx   . Asthma Neg Hx   . Allergic rhinitis Neg Hx    No current facility-administered medications for this encounter.   Current Outpatient Medications:  .  ALPRAZolam  (XANAX) 1 MG tablet, Take 1 mg by mouth daily as needed for anxiety. , Disp: , Rfl: 0 .  amLODipine (NORVASC) 5 MG tablet, Take 5 mg by mouth daily., Disp: , Rfl: 0 .  budesonide-formoterol (SYMBICORT) 160-4.5 MCG/ACT inhaler, Inhale 2 puffs into the lungs 2 (two) times daily., Disp: , Rfl:  .  furosemide (LASIX) 40 MG tablet, Take 40 mg by mouth daily as needed for edema. , Disp: , Rfl:  .  lubiprostone (AMITIZA) 24 MCG capsule, Take 24 mcg by mouth at bedtime. , Disp: , Rfl:  .  MUCINEX 600 MG 12 hr tablet, Take 600 mg by mouth 2 (two) times daily. , Disp: , Rfl: 0 .  pantoprazole (PROTONIX) 40 MG tablet, Take 40 mg by mouth daily., Disp: , Rfl:  .  PROAIR HFA 108 (90 Base) MCG/ACT inhaler, Inhale 2 puffs into the lungs every 6 (six) hours as needed for wheezing or shortness of breath. , Disp: , Rfl: 1 .  topiramate (TOPAMAX) 200 MG tablet, Take 200 mg by mouth 2 (two) times daily. , Disp: , Rfl:  .  traMADol (ULTRAM) 50 MG tablet, Take 150 mg by mouth at bedtime. , Disp: , Rfl:  .  PARoxetine (PAXIL) 10 MG tablet, Take 1 tablet (10 mg total) by mouth daily. (Patient not taking: Reported on 05/02/2018), Disp: 30 tablet, Rfl: 6    BP 130/80   Pulse 73   Ht 5\' 8"  (1.727 m)   Wt 153 lb (69.4 kg)   LMP 12/13/2015   BMI 23.26 kg/m   Physical Exam  Constitutional: She is oriented to person, place, and time. She appears well-developed and well-nourished. No distress.  HENT:  Head: Normocephalic and atraumatic.  Right Ear: External ear normal.  Left Ear: External ear normal.  Nose: Nose normal.  Mouth/Throat: Oropharynx is clear and moist. No oropharyngeal exudate.  Eyes: Pupils are equal, round, and reactive to light. Conjunctivae and EOM are normal. Right eye exhibits no discharge. Left eye exhibits no discharge. No scleral icterus.  Neck: Normal range of motion. Neck supple. No JVD present. No tracheal deviation present. No thyromegaly present.  Cardiovascular: Normal rate, regular rhythm  and intact distal pulses. PMI is not displaced.  Pulmonary/Chest: Effort normal. No stridor. No respiratory distress. She has no wheezes. She exhibits no tenderness.  Abdominal: Soft. Bowel sounds are normal. She exhibits no distension and no mass. There is no tenderness. There is no rebound.  Lymphadenopathy:    She has no cervical adenopathy.    She has no axillary adenopathy.       Right: No inguinal adenopathy present.       Left: No inguinal adenopathy present.  Neurological: She is alert and oriented to person, place, and time. She has normal reflexes. She displays normal reflexes. No cranial nerve deficit or sensory deficit. She exhibits normal muscle tone. Coordination normal.  Skin: Skin is warm, dry and intact. Capillary refill takes less than 2 seconds. No rash noted. She is not diaphoretic. No cyanosis. Nails show no clubbing.  Psychiatric: She has a normal mood and affect. Her behavior is normal. Judgment and thought content normal. Cognition and memory are normal.  Vitals reviewed.  Right knee looks good.  Alignment looks good.  No effusion.  She does have medial joint line tenderness and has trouble going to figure-of-four position but otherwise her range of motion is normal all ligaments are stable muscle strength and tone are normal without tremor skin looks good pulse and perfusion are normal temperature normal no edema lymph nodes are negative in that area sensation is normal in the limb  Provocative test including the Lockman test the posterior and anterior drawer test were normal she did have a positive McMurray sign     Medical decision-making section     DATA  MRI REPORT:     CLINICAL DATA:  Mechanical knee symptoms. Catching, locking, snapping and crepitus   EXAM: MRI OF THE RIGHT KNEE WITHOUT CONTRAST   TECHNIQUE: Multiplanar, multisequence MR imaging of the knee was performed. No intravenous contrast was administered.   COMPARISON:  None.    FINDINGS: MENISCI   Medial meniscus: Possible small undersurface vertical tear at the periphery of the body of the medial meniscus. Otherwise no medial meniscal tear.   Lateral meniscus:  Intact.   LIGAMENTS   Cruciates:  Intact ACL and PCL.   Collaterals: Medial collateral ligament is intact. Lateral collateral ligament complex is intact.   CARTILAGE   Patellofemoral:  No chondral defect.   Medial:  No chondral defect.   Lateral:  No chondral defect.  Joint:  No joint effusion. Normal Hoffa's fat. No plical thickening.   Popliteal Fossa:  Small Baker's cyst.  Intact popliteus tendon.   Extensor Mechanism: Intact quadriceps tendon. Intact patellar tendon. Intact medial patellar retinaculum. Intact lateral patellar retinaculum. Intact MPFL.   Bones:  No acute osseous abnormality.  No aggressive osseous lesion.   Other: No fluid collection or hematoma. Tendinosis at the origin of the medial gastrocnemius tendon with a small partial tear.   IMPRESSION: 1. Possible small undersurface vertical tear at the periphery of the body of the medial meniscus. Otherwise no medial meniscal tear. 2. Tendinosis at the origin of the medial gastrocnemius tendon with a small partial tear.     Electronically Signed   By: Kathreen Devoid   On: 05/09/2018 14:50     MY READING: MRI OF THE my reading of the MRI is that she has an undersurface medial meniscal tear no significant degenerative changes are seen in fact no degenerative changes are seen.         Encounter Diagnoses  Name Primary?  . Chronic pain of right knee    . Derangement of posterior horn of medial meniscus of right knee Yes      Arthroscopy right knee partial medial meniscectomy versus meniscal repair

## 2018-06-19 ENCOUNTER — Ambulatory Visit (HOSPITAL_COMMUNITY): Payer: PPO | Admitting: Anesthesiology

## 2018-06-19 ENCOUNTER — Ambulatory Visit (HOSPITAL_COMMUNITY)
Admission: RE | Admit: 2018-06-19 | Discharge: 2018-06-19 | Disposition: A | Discharge status: Home / Self Care | Payer: PPO | Source: Ambulatory Visit | Attending: Orthopedic Surgery | Admitting: Orthopedic Surgery

## 2018-06-19 ENCOUNTER — Encounter (HOSPITAL_COMMUNITY): Payer: Self-pay | Admitting: Anesthesiology

## 2018-06-19 ENCOUNTER — Encounter (HOSPITAL_COMMUNITY)
Admission: RE | Disposition: A | Discharge status: Home / Self Care | Payer: Self-pay | Source: Ambulatory Visit | Attending: Orthopedic Surgery

## 2018-06-19 DIAGNOSIS — M94261 Chondromalacia, right knee: Secondary | ICD-10-CM | POA: Insufficient documentation

## 2018-06-19 DIAGNOSIS — J45909 Unspecified asthma, uncomplicated: Secondary | ICD-10-CM | POA: Insufficient documentation

## 2018-06-19 DIAGNOSIS — Z7951 Long term (current) use of inhaled steroids: Secondary | ICD-10-CM | POA: Insufficient documentation

## 2018-06-19 DIAGNOSIS — F419 Anxiety disorder, unspecified: Secondary | ICD-10-CM | POA: Diagnosis not present

## 2018-06-19 DIAGNOSIS — K219 Gastro-esophageal reflux disease without esophagitis: Secondary | ICD-10-CM | POA: Insufficient documentation

## 2018-06-19 DIAGNOSIS — S83511A Sprain of anterior cruciate ligament of right knee, initial encounter: Secondary | ICD-10-CM | POA: Insufficient documentation

## 2018-06-19 DIAGNOSIS — F329 Major depressive disorder, single episode, unspecified: Secondary | ICD-10-CM | POA: Diagnosis not present

## 2018-06-19 DIAGNOSIS — M1711 Unilateral primary osteoarthritis, right knee: Secondary | ICD-10-CM | POA: Diagnosis not present

## 2018-06-19 DIAGNOSIS — Z87891 Personal history of nicotine dependence: Secondary | ICD-10-CM | POA: Insufficient documentation

## 2018-06-19 DIAGNOSIS — Z79899 Other long term (current) drug therapy: Secondary | ICD-10-CM | POA: Insufficient documentation

## 2018-06-19 DIAGNOSIS — S83241A Other tear of medial meniscus, current injury, right knee, initial encounter: Secondary | ICD-10-CM | POA: Diagnosis not present

## 2018-06-19 DIAGNOSIS — Z8249 Family history of ischemic heart disease and other diseases of the circulatory system: Secondary | ICD-10-CM | POA: Diagnosis not present

## 2018-06-19 DIAGNOSIS — M67261 Synovial hypertrophy, not elsewhere classified, right lower leg: Secondary | ICD-10-CM | POA: Insufficient documentation

## 2018-06-19 DIAGNOSIS — X58XXXA Exposure to other specified factors, initial encounter: Secondary | ICD-10-CM | POA: Insufficient documentation

## 2018-06-19 HISTORY — PX: CHONDROPLASTY: SHX5177

## 2018-06-19 SURGERY — CHONDROPLASTY
Anesthesia: General | Site: Knee | Laterality: Right

## 2018-06-19 MED ORDER — HYDROCODONE-ACETAMINOPHEN 7.5-325 MG PO TABS: 1.0000 | ORAL_TABLET | Freq: Once | ORAL | Status: DC | PRN

## 2018-06-19 MED ORDER — CEFAZOLIN SODIUM-DEXTROSE 2-4 GM/100ML-% IV SOLN
2.0000 g | INTRAVENOUS | Status: AC
  Administered 2018-06-19: 2 g via INTRAVENOUS

## 2018-06-19 MED ORDER — ONDANSETRON HCL 4 MG/2ML IJ SOLN
4.0000 mg | Freq: Once | INTRAMUSCULAR | Status: AC
  Administered 2018-06-19: 4 mg via INTRAVENOUS

## 2018-06-19 MED ORDER — PROPOFOL 10 MG/ML IV BOLUS
INTRAVENOUS | Status: AC
  Filled 2018-06-19: qty 20

## 2018-06-19 MED ORDER — HYDROCODONE-ACETAMINOPHEN 7.5-325 MG PO TABS: 1.0000 | ORAL_TABLET | ORAL | 0 refills | Status: AC | PRN

## 2018-06-19 MED ORDER — MIDAZOLAM HCL 5 MG/5ML IJ SOLN
INTRAMUSCULAR | Status: DC | PRN
  Administered 2018-06-19: 2 mg via INTRAVENOUS

## 2018-06-19 MED ORDER — ONDANSETRON HCL 4 MG/2ML IJ SOLN
INTRAMUSCULAR | Status: AC
  Filled 2018-06-19: qty 8

## 2018-06-19 MED ORDER — FENTANYL CITRATE (PF) 100 MCG/2ML IJ SOLN
INTRAMUSCULAR | Status: DC | PRN
  Administered 2018-06-19: 50 ug via INTRAVENOUS
  Administered 2018-06-19: 25 ug via INTRAVENOUS
  Administered 2018-06-19: 50 ug via INTRAVENOUS
  Administered 2018-06-19: 25 ug via INTRAVENOUS
  Administered 2018-06-19: 50 ug via INTRAVENOUS

## 2018-06-19 MED ORDER — MIDAZOLAM HCL 2 MG/2ML IJ SOLN: 0.5000 mg | Freq: Once | INTRAMUSCULAR | Status: DC | PRN

## 2018-06-19 MED ORDER — MIDAZOLAM HCL 2 MG/2ML IJ SOLN
INTRAMUSCULAR | Status: AC
  Filled 2018-06-19: qty 2

## 2018-06-19 MED ORDER — SODIUM CHLORIDE 0.9 % IR SOLN
Status: DC | PRN
  Administered 2018-06-19 (×2): 3000 mL

## 2018-06-19 MED ORDER — 0.9 % SODIUM CHLORIDE (POUR BTL) OPTIME
TOPICAL | Status: DC | PRN
  Administered 2018-06-19: 1000 mL

## 2018-06-19 MED ORDER — HYDROMORPHONE HCL 1 MG/ML IJ SOLN
0.2500 mg | INTRAMUSCULAR | Status: DC | PRN
  Administered 2018-06-19 (×3): 0.5 mg via INTRAVENOUS
  Filled 2018-06-19 (×3): qty 0.5

## 2018-06-19 MED ORDER — LACTATED RINGERS IV SOLN
INTRAVENOUS | Status: DC
  Administered 2018-06-19: 10:00:00 via INTRAVENOUS

## 2018-06-19 MED ORDER — ONDANSETRON HCL 4 MG/2ML IJ SOLN
INTRAMUSCULAR | Status: AC
  Filled 2018-06-19: qty 2

## 2018-06-19 MED ORDER — PROMETHAZINE HCL 25 MG/ML IJ SOLN: 6.2500 mg | INTRAMUSCULAR | Status: DC | PRN

## 2018-06-19 MED ORDER — FENTANYL CITRATE (PF) 250 MCG/5ML IJ SOLN
INTRAMUSCULAR | Status: AC
  Filled 2018-06-19: qty 5

## 2018-06-19 MED ORDER — PROMETHAZINE HCL 12.5 MG PO TABS: 12.5000 mg | ORAL_TABLET | Freq: Four times a day (QID) | ORAL | 0 refills | Status: DC | PRN

## 2018-06-19 MED ORDER — ONDANSETRON HCL 4 MG/2ML IJ SOLN
INTRAMUSCULAR | Status: DC | PRN
  Administered 2018-06-19: 4 mg via INTRAVENOUS

## 2018-06-19 MED ORDER — OXYCODONE HCL 5 MG PO TABS
5.0000 mg | ORAL_TABLET | Freq: Once | ORAL | Status: AC
  Administered 2018-06-19: 5 mg via ORAL

## 2018-06-19 MED ORDER — OXYCODONE HCL 5 MG PO TABS
ORAL_TABLET | ORAL | Status: AC
  Filled 2018-06-19: qty 1

## 2018-06-19 MED ORDER — CHLORHEXIDINE GLUCONATE 4 % EX LIQD: 60.0000 mL | Freq: Once | CUTANEOUS | Status: DC

## 2018-06-19 MED ORDER — CEFAZOLIN SODIUM-DEXTROSE 2-4 GM/100ML-% IV SOLN
INTRAVENOUS | Status: AC
  Filled 2018-06-19: qty 100

## 2018-06-19 MED ORDER — PROPOFOL 10 MG/ML IV BOLUS
INTRAVENOUS | Status: DC | PRN
  Administered 2018-06-19: 150 mg via INTRAVENOUS

## 2018-06-19 SURGICAL SUPPLY — 48 items
ARTHROWAND PARAGON T2 (SURGICAL WAND)
BANDAGE ELASTIC 6 LF NS (GAUZE/BANDAGES/DRESSINGS) ×4 IMPLANT
BLADE 11 SAFETY STRL DISP (BLADE) ×4 IMPLANT
BLADE AGGRESSIVE PLUS 4.0 (BLADE) ×4 IMPLANT
BNDG CMPR MED 5X6 ELC HKLP NS (GAUZE/BANDAGES/DRESSINGS) ×2
CHLORAPREP W/TINT 26ML (MISCELLANEOUS) ×8 IMPLANT
CLOSURE WOUND 1/2 X4 (GAUZE/BANDAGES/DRESSINGS) ×1
CLOTH BEACON ORANGE TIMEOUT ST (SAFETY) ×4 IMPLANT
COOLER CRYO IC GRAV AND TUBE (ORTHOPEDIC SUPPLIES) IMPLANT
CUFF CRYO KNEE LG 20X31 COOLER (ORTHOPEDIC SUPPLIES) ×4 IMPLANT
CUFF CRYO KNEE18X23 MED (MISCELLANEOUS) ×4 IMPLANT
CUFF TOURNIQUET SINGLE 34IN LL (TOURNIQUET CUFF) ×4 IMPLANT
CUTTER ANGLED DBL BITE 4.5 (BURR) ×4 IMPLANT
DECANTER SPIKE VIAL GLASS SM (MISCELLANEOUS) ×8 IMPLANT
GAUZE 4X4 16PLY RFD (DISPOSABLE) ×4 IMPLANT
GAUZE XEROFORM 5X9 LF (GAUZE/BANDAGES/DRESSINGS) ×4 IMPLANT
GLOVE BIOGEL PI IND STRL 7.0 (GLOVE) ×2 IMPLANT
GLOVE BIOGEL PI INDICATOR 7.0 (GLOVE) ×2
GLOVE SKINSENSE NS SZ8.0 LF (GLOVE) ×2
GLOVE SKINSENSE STRL SZ8.0 LF (GLOVE) ×2 IMPLANT
GLOVE SS N UNI LF 8.5 STRL (GLOVE) ×4 IMPLANT
GOWN STRL REUS W/TWL LRG LVL3 (GOWN DISPOSABLE) ×12 IMPLANT
GOWN STRL REUS W/TWL XL LVL3 (GOWN DISPOSABLE) ×4 IMPLANT
HLDR LEG FOAM (MISCELLANEOUS) ×2 IMPLANT
IV NS IRRIG 3000ML ARTHROMATIC (IV SOLUTION) ×8 IMPLANT
KIT BLADEGUARD II DBL (SET/KITS/TRAYS/PACK) ×4 IMPLANT
KIT TURNOVER CYSTO (KITS) ×4 IMPLANT
LEG HOLDER FOAM (MISCELLANEOUS) ×2
MANIFOLD NEPTUNE II (INSTRUMENTS) ×4 IMPLANT
MARKER SKIN DUAL TIP RULER LAB (MISCELLANEOUS) ×4 IMPLANT
NEEDLE HYPO 18GX1.5 BLUNT FILL (NEEDLE) ×4 IMPLANT
NEEDLE HYPO 21X1.5 SAFETY (NEEDLE) ×4 IMPLANT
NEEDLE SPNL 18GX3.5 QUINCKE PK (NEEDLE) ×4 IMPLANT
NS IRRIG 1000ML POUR BTL (IV SOLUTION) ×4 IMPLANT
PACK ARTHRO LIMB DRAPE STRL (MISCELLANEOUS) ×4 IMPLANT
PAD ABD 5X9 TENDERSORB (GAUZE/BANDAGES/DRESSINGS) ×4 IMPLANT
PAD ARMBOARD 7.5X6 YLW CONV (MISCELLANEOUS) ×4 IMPLANT
PADDING CAST COTTON 6X4 STRL (CAST SUPPLIES) ×4 IMPLANT
PADDING WEBRIL 6 STERILE (GAUZE/BANDAGES/DRESSINGS) ×4 IMPLANT
PROBE BIPOLAR 50 DEGREE SUCT (MISCELLANEOUS) ×4 IMPLANT
SET ARTHROSCOPY INST (INSTRUMENTS) ×4 IMPLANT
SET BASIN LINEN APH (SET/KITS/TRAYS/PACK) ×4 IMPLANT
STRIP CLOSURE SKIN 1/2X4 (GAUZE/BANDAGES/DRESSINGS) ×3 IMPLANT
SUT ETHILON 3 0 FSL (SUTURE) IMPLANT
SYR 10ML LL (SYRINGE) ×4 IMPLANT
SYR 30ML LL (SYRINGE) ×4 IMPLANT
WAND ARTHRO PARAGON T2 (SURGICAL WAND) IMPLANT
YANKAUER SUCT BULB TIP 10FT TU (MISCELLANEOUS) ×12 IMPLANT

## 2018-06-19 NOTE — Interval H&P Note (Signed)
History and Physical Interval Note:  06/19/2018 10:04 AM  [name]  has presented today for surgery, with the diagnosis of torn medial meniscus right knee  The various methods of treatment have been discussed with the patient and family. After consideration of risks, benefits and other options for treatment, the patient has consented to  Procedure(s): KNEE ARTHROSCOPY WITH Medial MENISCAL REPAIR (Right) as a surgical intervention .  The patient's history has been reviewed, patient examined, no change in status, stable for surgery.  I have reviewed the patient's chart and labs.  Questions were answered to the patient's satisfaction.     Arther Abbott

## 2018-06-19 NOTE — Anesthesia Postprocedure Evaluation (Signed)
Anesthesia Post Note  Patient: [name]  Procedure(s) Performed: KNEE ARTHROSCOPY WITH chondroplasty (Right Knee)  Patient location during evaluation: PACU Anesthesia Type: General Level of consciousness: awake and patient cooperative Pain management: pain level controlled Vital Signs Assessment: post-procedure vital signs reviewed and stable Respiratory status: spontaneous breathing, nonlabored ventilation and respiratory function stable Cardiovascular status: blood pressure returned to baseline Postop Assessment: no apparent nausea or vomiting Anesthetic complications: no     Last Vitals:  Vitals:   06/19/18 1200 06/19/18 1218  BP: 122/72 (!) 141/80  Pulse: 80 85  Resp: 13 18  Temp:  36.4 C  SpO2: 98% 98%    Last Pain:  Vitals:   06/19/18 1218  TempSrc: Oral  PainSc: 0-No pain                 Jalene Lacko J

## 2018-06-19 NOTE — Anesthesia Preprocedure Evaluation (Addendum)
Anesthesia Evaluation  Patient identified by MRN, date of birth, ID band Patient awake  General Assessment Comment:REPORTS LIDOCAINE ALLERGY  Reviewed: Allergy & Precautions, NPO status , Patient's Chart, lab work & pertinent test results  Airway Mallampati: II  TM Distance: >3 FB Neck ROM: Full    Dental no notable dental hx. (+) Teeth Intact Perm upper retainer:   Pulmonary asthma , former smoker,    Pulmonary exam normal breath sounds clear to auscultation       Cardiovascular Exercise Tolerance: Good hypertension, Pt. on medications negative cardio ROS Normal cardiovascular examI Rhythm:Regular Rate:Normal     Neuro/Psych Anxiety Depression  Neuromuscular disease negative psych ROS   GI/Hepatic Neg liver ROS, GERD  Medicated and Controlled,  Endo/Other  negative endocrine ROS  Renal/GU negative Renal ROS  negative genitourinary   Musculoskeletal  (+) Arthritis , Osteoarthritis,  Fibromyalgia -  Abdominal   Peds negative pediatric ROS (+)  Hematology negative hematology ROS (+)   Anesthesia Other Findings   Reproductive/Obstetrics negative OB ROS                             Anesthesia Physical Anesthesia Plan  ASA: II  Anesthesia Plan: General   Post-op Pain Management:    Induction: Intravenous  PONV Risk Score and Plan:   Airway Management Planned: LMA  Additional Equipment:   Intra-op Plan:   Post-operative Plan: Extubation in OR  Informed Consent: I have reviewed the patients History and Physical, chart, labs and discussed the procedure including the risks, benefits and alternatives for the proposed anesthesia with the patient or authorized representative who has indicated his/her understanding and acceptance.   Dental advisory given  Plan Discussed with: CRNA  Anesthesia Plan Comments:         Anesthesia Quick Evaluation

## 2018-06-19 NOTE — Brief Op Note (Signed)
06/19/2018  11:15 AM  PATIENT:  [name]  62 y.o. 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   PROCEDURE:  Procedure(s): KNEE ARTHROSCOPY WITH chondroplasty (Right) right knee   Findings:  Lateral compartment: Normal meniscus normal articular surfaces Patellofemoral joint normal trochlea mild grade I chondromalacia lateral facet Medial compartment normal meniscus grade I chondromalacia medial femoral condyle synovial hypertrophy meniscal capsular junction PCL normal, ACL anteromedial bundle old tear scarred to the PCL posterior lateral bundle lax but intact Trace positive Lockman negative pivot  Knee arthroscopy dictation  The patient was identified in the preoperative holding area using 2 approved identification mechanisms. The chart was reviewed and updated. The surgical site was confirmed as RIGHT  knee and marked with an indelible marker.  The patient was taken to the operating room for anesthesia. After successful  GENERAL  anesthesia, 2 GM ANCEF  was used as IV antibiotics.  The patient was placed in the supine position with the (RIGHT LEG ) the operative extremity in an arthroscopic leg holder and the opposite extremity in a padded leg holder.  The timeout was executed.  Limb was exsanguinated with a 4 inch Esmarch tourniquet was inflated to 300 mmHg.  A lateral portal was established with an 11 blade and the scope was introduced into the joint. A diagnostic arthroscopy was performed in circumferential manner examining the entire knee joint. A medial portal was established and the diagnostic arthroscopy was repeated using a probe to palpate intra-articular structures as they were encountered.   Particular attention was paid to the medial meniscus and the area of concern for undersurface vertical tearing.  I could not find a tear in the meniscus.  There was  chondromalacia of the medial femoral condyle this was debrided with a motorized shaver  There was synovial hypertrophy at the meniscocapsular junction in the area where the tear was thought to be on MRI this was debrided.  Interestingly the anteromedial bundle of the ACL was not in its normal anatomic position and was scarred to the posterior capsular structure and PCL the posterior lateral bundle was intact although lacks  At this point a Lockman maneuver was performed and it was trace positive and this was repeated after the procedure with the scope out of the joint at the same result.  Pivot shift maneuver did not produce any laxity and was negative   The arthroscopic pump was placed on the wash mode and any excess debris was removed from the joint using suction.  Secondary to lidocaine allergy postoperative anesthetic injections were done and no preop portal injections were done   The portals were closed with 3-0 nylon suture.  A sterile bandage, Ace wrap and Cryo/Cuff was placed and the Cryo/Cuff was activated.  Tourniquet was released..  The patient was taken to the recovery room in stable condition.   SURGEON:  Surgeon(s) and Role:    * Carole Civil, MD - Primary  PHYSICIAN ASSISTANT:   ASSISTANTS: none   ANESTHESIA:   general  EBL:  0 mL   BLOOD ADMINISTERED:none  DRAINS: none   LOCAL MEDICATIONS USED:  NONE    SPECIMEN:  No Specimen  DISPOSITION OF SPECIMEN:  N/A  COUNTS:  YES  TOURNIQUET:   Total Tourniquet Time Documented: Thigh (Right) - 20 minutes Total: Thigh (Right) - 20 minutes   DICTATION: .Dragon Dictation  PLAN OF CARE: Discharge to home  after PACU  PATIENT DISPOSITION:  PACU - hemodynamically stable.   Delay start of Pharmacological VTE agent (>24hrs) due to surgical blood loss or risk of bleeding: no

## 2018-06-19 NOTE — Transfer of Care (Signed)
Immediate Anesthesia Transfer of Care Note  Patient: [name]  Procedure(s) Performed: KNEE ARTHROSCOPY WITH chondroplasty (Right Knee)  Patient Location: PACU  Anesthesia Type:General  Level of Consciousness: awake and alert   Airway & Oxygen Therapy: Patient Spontanous Breathing  Post-op Assessment: Report given to RN  Post vital signs: Reviewed and stable  Last Vitals:  Vitals Value Taken Time  BP 131/75 06/19/2018 11:30 AM  Temp    Pulse 85 06/19/2018 11:30 AM  Resp 21 06/19/2018 11:30 AM  SpO2 98 % 06/19/2018 11:30 AM  Vitals shown include unvalidated device data.  Last Pain:  Vitals:   06/19/18 0921  TempSrc: Oral         Complications: No apparent anesthesia complications

## 2018-06-19 NOTE — Op Note (Signed)
06/19/2018  11:15 AM  PATIENT:  [name]  62 y.o. 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   PROCEDURE:  Procedure(s): KNEE ARTHROSCOPY WITH chondroplasty (Right) right knee   Findings:  Lateral compartment: Normal meniscus normal articular surfaces Patellofemoral joint normal trochlea mild grade I chondromalacia lateral facet Medial compartment normal meniscus grade I chondromalacia medial femoral condyle synovial hypertrophy meniscal capsular junction PCL normal, ACL anteromedial bundle old tear scarred to the PCL posterior lateral bundle lax but intact Trace positive Lockman negative pivot  Knee arthroscopy dictation  The patient was identified in the preoperative holding area using 2 approved identification mechanisms. The chart was reviewed and updated. The surgical site was confirmed as RIGHT  knee and marked with an indelible marker.  The patient was taken to the operating room for anesthesia. After successful  GENERAL  anesthesia, 2 GM ANCEF  was used as IV antibiotics.  The patient was placed in the supine position with the (RIGHT LEG ) the operative extremity in an arthroscopic leg holder and the opposite extremity in a padded leg holder.  The timeout was executed.  Limb was exsanguinated with a 4 inch Esmarch tourniquet was inflated to 300 mmHg.  A lateral portal was established with an 11 blade and the scope was introduced into the joint. A diagnostic arthroscopy was performed in circumferential manner examining the entire knee joint. A medial portal was established and the diagnostic arthroscopy was repeated using a probe to palpate intra-articular structures as they were encountered.   Particular attention was paid to the medial meniscus and the area of concern for undersurface vertical tearing.  I could not find a tear in the meniscus.  There was  chondromalacia of the medial femoral condyle this was debrided with a motorized shaver  There was synovial hypertrophy at the meniscocapsular junction in the area where the tear was thought to be on MRI this was debrided.  Interestingly the anteromedial bundle of the ACL was not in its normal anatomic position and was scarred to the posterior capsular structure and PCL the posterior lateral bundle was intact although lacks  At this point a Lockman maneuver was performed and it was trace positive and this was repeated after the procedure with the scope out of the joint at the same result.  Pivot shift maneuver did not produce any laxity and was negative   The arthroscopic pump was placed on the wash mode and any excess debris was removed from the joint using suction.  Secondary to lidocaine allergy postoperative anesthetic injections were done and no preop portal injections were done   The portals were closed with 3-0 nylon suture.  A sterile bandage, Ace wrap and Cryo/Cuff was placed and the Cryo/Cuff was activated.  Tourniquet was released..  The patient was taken to the recovery room in stable condition.   SURGEON:  Surgeon(s) and Role:    * Carole Civil, MD - Primary  PHYSICIAN ASSISTANT:   ASSISTANTS: none   ANESTHESIA:   general  EBL:  0 mL   BLOOD ADMINISTERED:none  DRAINS: none   LOCAL MEDICATIONS USED:  NONE    SPECIMEN:  No Specimen  DISPOSITION OF SPECIMEN:  N/A  COUNTS:  YES  TOURNIQUET:   Total Tourniquet Time Documented: Thigh (Right) - 20 minutes Total: Thigh (Right) - 20 minutes   DICTATION: .Dragon Dictation  PLAN OF CARE: Discharge to home  after PACU  PATIENT DISPOSITION:  PACU - hemodynamically stable.   Delay start of Pharmacological VTE agent (>24hrs) due to surgical blood loss or risk of bleeding: no

## 2018-06-19 NOTE — Anesthesia Procedure Notes (Signed)
Procedure Name: LMA Insertion Date/Time: 06/19/2018 10:34 AM Performed by: Ollen Bowl, CRNA Pre-anesthesia Checklist: Patient identified, Patient being monitored, Emergency Drugs available, Timeout performed and Suction available Patient Re-evaluated:Patient Re-evaluated prior to induction Oxygen Delivery Method: Circle System Utilized Preoxygenation: Pre-oxygenation with 100% oxygen Induction Type: IV induction Ventilation: Mask ventilation without difficulty LMA: LMA inserted LMA Size: 4.0 Number of attempts: 1 Placement Confirmation: positive ETCO2 and breath sounds checked- equal and bilateral

## 2018-06-19 NOTE — Discharge Instructions (Signed)
We did not find a meniscal tear we did find arthritis of the knee on the side you were hurting on as well as overgrowth of the synovial tissue (joint lining) which may have been causing you some pain in this area.  Your pain should resolve within 1 to 3 weeks   General Anesthesia, Adult, Care After These instructions provide you with information about caring for yourself after your procedure. Your health care provider may also give you more specific instructions. Your treatment has been planned according to current medical practices, but problems sometimes occur. Call your health care provider if you have any problems or questions after your procedure. What can I expect after the procedure? After the procedure, it is common to have:  Vomiting.  A sore throat.  Mental slowness.  It is common to feel:  Nauseous.  Cold or shivery.  Sleepy.  Tired.  Sore or achy, even in parts of your body where you did not have surgery.  Follow these instructions at home: For at least 24 hours after the procedure:  Do not: ? Participate in activities where you could fall or become injured. ? Drive. ? Use heavy machinery. ? Drink alcohol. ? Take sleeping pills or medicines that cause drowsiness. ? Make important decisions or sign legal documents. ? Take care of children on your own.  Rest. Eating and drinking  If you vomit, drink water, juice, or soup when you can drink without vomiting.  Drink enough fluid to keep your urine clear or pale yellow.  Make sure you have little or no nausea before eating solid foods.  Follow the diet recommended by your health care provider. General instructions  Have a responsible adult stay with you until you are awake and alert.  Return to your normal activities as told by your health care provider. Ask your health care provider what activities are safe for you.  Take over-the-counter and prescription medicines only as told by your health care  provider.  If you smoke, do not smoke without supervision.  Keep all follow-up visits as told by your health care provider. This is important. Contact a health care provider if:  You continue to have nausea or vomiting at home, and medicines are not helpful.  You cannot drink fluids or start eating again.  You cannot urinate after 8-12 hours.  You develop a skin rash.  You have fever.  You have increasing redness at the site of your procedure. Get help right away if:  You have difficulty breathing.  You have chest pain.  You have unexpected bleeding.  You feel that you are having a life-threatening or urgent problem. This information is not intended to replace advice given to you by your health care provider. Make sure you discuss any questions you have with your health care provider. Document Released: 11/12/2000 Document Revised: 01/09/2016 Document Reviewed: 07/21/2015 Elsevier Interactive Patient Education  [employer].

## 2018-06-20 ENCOUNTER — Encounter (HOSPITAL_COMMUNITY): Payer: Self-pay | Admitting: Orthopedic Surgery

## 2018-06-24 DIAGNOSIS — M79672 Pain in left foot: Secondary | ICD-10-CM | POA: Diagnosis not present

## 2018-06-24 DIAGNOSIS — M7742 Metatarsalgia, left foot: Secondary | ICD-10-CM | POA: Diagnosis not present

## 2018-06-26 DIAGNOSIS — M545 Low back pain: Secondary | ICD-10-CM | POA: Diagnosis not present

## 2018-06-26 DIAGNOSIS — Z9889 Other specified postprocedural states: Secondary | ICD-10-CM | POA: Insufficient documentation

## 2018-06-26 DIAGNOSIS — M797 Fibromyalgia: Secondary | ICD-10-CM | POA: Diagnosis not present

## 2018-06-26 DIAGNOSIS — J45909 Unspecified asthma, uncomplicated: Secondary | ICD-10-CM | POA: Diagnosis not present

## 2018-06-26 DIAGNOSIS — I10 Essential (primary) hypertension: Secondary | ICD-10-CM | POA: Diagnosis not present

## 2018-06-27 ENCOUNTER — Encounter: Payer: Self-pay | Admitting: Orthopedic Surgery

## 2018-06-27 ENCOUNTER — Ambulatory Visit (INDEPENDENT_AMBULATORY_CARE_PROVIDER_SITE_OTHER): Payer: PPO | Admitting: Orthopedic Surgery

## 2018-06-27 DIAGNOSIS — Z9889 Other specified postprocedural states: Secondary | ICD-10-CM

## 2018-06-27 NOTE — Progress Notes (Signed)
Postop visit #1 status post arthroscopy left knee patient says she is doing very well her pain on the medial side of the knee is gone she does have a large bruise on the medial side of the thigh which is nontender her flexion is well past 120 degrees her portals are clean her sutures were removed  06/19/2018  11:15 AM  PATIENT:  [name]  62 y.o. 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  . S/P right knee arthroscopy 06/19/18     I released her to normal activity

## 2018-06-27 NOTE — Patient Instructions (Signed)
Exercise as tolerated.

## 2018-07-03 DIAGNOSIS — I10 Essential (primary) hypertension: Secondary | ICD-10-CM | POA: Diagnosis not present

## 2018-07-03 DIAGNOSIS — Z79899 Other long term (current) drug therapy: Secondary | ICD-10-CM | POA: Diagnosis not present

## 2018-07-03 DIAGNOSIS — M7742 Metatarsalgia, left foot: Secondary | ICD-10-CM | POA: Diagnosis not present

## 2018-07-03 DIAGNOSIS — Z7951 Long term (current) use of inhaled steroids: Secondary | ICD-10-CM | POA: Diagnosis not present

## 2018-07-03 DIAGNOSIS — M2120 Flexion deformity, unspecified site: Secondary | ICD-10-CM | POA: Diagnosis not present

## 2018-07-03 DIAGNOSIS — M216X2 Other acquired deformities of left foot: Secondary | ICD-10-CM | POA: Diagnosis not present

## 2018-07-03 DIAGNOSIS — G5782 Other specified mononeuropathies of left lower limb: Secondary | ICD-10-CM | POA: Diagnosis not present

## 2018-07-03 DIAGNOSIS — M2042 Other hammer toe(s) (acquired), left foot: Secondary | ICD-10-CM | POA: Diagnosis not present

## 2018-07-03 DIAGNOSIS — G5762 Lesion of plantar nerve, left lower limb: Secondary | ICD-10-CM | POA: Diagnosis not present

## 2018-07-15 DIAGNOSIS — M216X2 Other acquired deformities of left foot: Secondary | ICD-10-CM | POA: Diagnosis not present

## 2018-07-15 DIAGNOSIS — M2042 Other hammer toe(s) (acquired), left foot: Secondary | ICD-10-CM | POA: Diagnosis not present

## 2018-07-15 DIAGNOSIS — M79675 Pain in left toe(s): Secondary | ICD-10-CM | POA: Diagnosis not present

## 2018-07-22 DIAGNOSIS — M2042 Other hammer toe(s) (acquired), left foot: Secondary | ICD-10-CM | POA: Diagnosis not present

## 2018-07-22 DIAGNOSIS — M216X2 Other acquired deformities of left foot: Secondary | ICD-10-CM | POA: Diagnosis not present

## 2018-08-06 ENCOUNTER — Telehealth: Payer: Self-pay | Admitting: Orthopedic Surgery

## 2018-08-06 NOTE — Telephone Encounter (Signed)
CAROL   SET IT UP FOR NEXT YEAR   ALL OF THEM EXCEPT EMERGENCIES THAT GOES FOR ALL THE APPTS   ASK ME IF NOT SURE

## 2018-08-06 NOTE — Telephone Encounter (Signed)
Patient called to request a follow up appointment - I've scheduled in January, next available; states her right (operative knee from date of arthroscopy 06/19/18).  Please advise if any recommendations or if needs work-in appointment before holiday.

## 2018-08-07 NOTE — Telephone Encounter (Signed)
Called back to patient; aware of the January appointment we had scheduled initially; said that date is fine.

## 2018-08-19 DIAGNOSIS — M216X2 Other acquired deformities of left foot: Secondary | ICD-10-CM | POA: Diagnosis not present

## 2018-08-19 DIAGNOSIS — M2042 Other hammer toe(s) (acquired), left foot: Secondary | ICD-10-CM | POA: Diagnosis not present

## 2018-08-20 HISTORY — PX: FOOT SURGERY: SHX648

## 2018-08-28 DIAGNOSIS — Z884 Allergy status to anesthetic agent status: Secondary | ICD-10-CM | POA: Diagnosis not present

## 2018-08-28 DIAGNOSIS — Z7951 Long term (current) use of inhaled steroids: Secondary | ICD-10-CM | POA: Diagnosis not present

## 2018-08-28 DIAGNOSIS — F419 Anxiety disorder, unspecified: Secondary | ICD-10-CM | POA: Diagnosis not present

## 2018-08-28 DIAGNOSIS — M21272 Flexion deformity, left ankle and toes: Secondary | ICD-10-CM | POA: Diagnosis not present

## 2018-08-28 DIAGNOSIS — M205X2 Other deformities of toe(s) (acquired), left foot: Secondary | ICD-10-CM | POA: Diagnosis not present

## 2018-08-28 DIAGNOSIS — M2042 Other hammer toe(s) (acquired), left foot: Secondary | ICD-10-CM | POA: Diagnosis not present

## 2018-08-28 DIAGNOSIS — G43909 Migraine, unspecified, not intractable, without status migrainosus: Secondary | ICD-10-CM | POA: Diagnosis not present

## 2018-08-28 DIAGNOSIS — Z888 Allergy status to other drugs, medicaments and biological substances status: Secondary | ICD-10-CM | POA: Diagnosis not present

## 2018-08-28 DIAGNOSIS — M797 Fibromyalgia: Secondary | ICD-10-CM | POA: Diagnosis not present

## 2018-08-28 DIAGNOSIS — I10 Essential (primary) hypertension: Secondary | ICD-10-CM | POA: Diagnosis not present

## 2018-08-28 DIAGNOSIS — Z79899 Other long term (current) drug therapy: Secondary | ICD-10-CM | POA: Diagnosis not present

## 2018-08-28 DIAGNOSIS — K219 Gastro-esophageal reflux disease without esophagitis: Secondary | ICD-10-CM | POA: Diagnosis not present

## 2018-08-28 DIAGNOSIS — H3530 Unspecified macular degeneration: Secondary | ICD-10-CM | POA: Diagnosis not present

## 2018-08-28 DIAGNOSIS — J45909 Unspecified asthma, uncomplicated: Secondary | ICD-10-CM | POA: Diagnosis not present

## 2018-08-29 ENCOUNTER — Ambulatory Visit: Payer: PPO | Admitting: Orthopedic Surgery

## 2018-09-16 DIAGNOSIS — M2042 Other hammer toe(s) (acquired), left foot: Secondary | ICD-10-CM | POA: Diagnosis not present

## 2018-09-16 DIAGNOSIS — M216X2 Other acquired deformities of left foot: Secondary | ICD-10-CM | POA: Diagnosis not present

## 2018-09-26 DIAGNOSIS — F419 Anxiety disorder, unspecified: Secondary | ICD-10-CM | POA: Diagnosis not present

## 2018-09-26 DIAGNOSIS — M545 Low back pain: Secondary | ICD-10-CM | POA: Diagnosis not present

## 2018-09-26 DIAGNOSIS — K210 Gastro-esophageal reflux disease with esophagitis: Secondary | ICD-10-CM | POA: Diagnosis not present

## 2018-09-26 DIAGNOSIS — J45909 Unspecified asthma, uncomplicated: Secondary | ICD-10-CM | POA: Diagnosis not present

## 2018-09-30 ENCOUNTER — Encounter: Payer: Self-pay | Admitting: Gastroenterology

## 2018-09-30 ENCOUNTER — Other Ambulatory Visit: Payer: Self-pay | Admitting: Pulmonary Disease

## 2018-09-30 DIAGNOSIS — G8929 Other chronic pain: Secondary | ICD-10-CM

## 2018-09-30 DIAGNOSIS — M545 Low back pain, unspecified: Secondary | ICD-10-CM

## 2018-10-06 ENCOUNTER — Ambulatory Visit
Admission: RE | Admit: 2018-10-06 | Discharge: 2018-10-06 | Disposition: A | Discharge status: Home / Self Care | Payer: PPO | Source: Ambulatory Visit | Attending: Pulmonary Disease | Admitting: Pulmonary Disease

## 2018-10-06 DIAGNOSIS — M545 Low back pain, unspecified: Secondary | ICD-10-CM

## 2018-10-06 DIAGNOSIS — M47817 Spondylosis without myelopathy or radiculopathy, lumbosacral region: Secondary | ICD-10-CM | POA: Diagnosis not present

## 2018-10-06 DIAGNOSIS — G8929 Other chronic pain: Secondary | ICD-10-CM

## 2018-10-06 MED ORDER — IOPAMIDOL (ISOVUE-M 200) INJECTION 41%
1.0000 mL | Freq: Once | INTRAMUSCULAR | Status: AC
  Administered 2018-10-06: 1 mL via EPIDURAL

## 2018-10-06 MED ORDER — METHYLPREDNISOLONE ACETATE 40 MG/ML INJ SUSP (RADIOLOG
120.0000 mg | Freq: Once | INTRAMUSCULAR | Status: AC
  Administered 2018-10-06: 120 mg via EPIDURAL

## 2018-10-07 DIAGNOSIS — M216X2 Other acquired deformities of left foot: Secondary | ICD-10-CM | POA: Diagnosis not present

## 2018-10-07 DIAGNOSIS — M2042 Other hammer toe(s) (acquired), left foot: Secondary | ICD-10-CM | POA: Diagnosis not present

## 2018-10-08 ENCOUNTER — Other Ambulatory Visit (HOSPITAL_COMMUNITY): Payer: Self-pay | Admitting: Obstetrics and Gynecology

## 2018-10-08 DIAGNOSIS — Z1231 Encounter for screening mammogram for malignant neoplasm of breast: Secondary | ICD-10-CM

## 2018-10-14 DIAGNOSIS — M216X2 Other acquired deformities of left foot: Secondary | ICD-10-CM | POA: Diagnosis not present

## 2018-10-14 DIAGNOSIS — M7662 Achilles tendinitis, left leg: Secondary | ICD-10-CM | POA: Diagnosis not present

## 2018-10-16 ENCOUNTER — Ambulatory Visit (HOSPITAL_COMMUNITY): Payer: PPO

## 2018-10-22 ENCOUNTER — Encounter: Payer: Self-pay | Admitting: Cardiology

## 2018-10-22 NOTE — Progress Notes (Signed)
Cardiology Office Note  Date: 10/23/2018   ID: [name], DOB 1956/08/20, MRN [id number]  PCP: Sinda Du, MD  Consulting Cardiologist: Rozann Lesches, MD   Chief Complaint  Patient presents with  . Chest Pain    History of Present Illness: [name] is a 63 y.o. female referred for cardiology consultation by Dr. Luan Pulling for the evaluation of chest pain.  We discussed her symptoms today, she is somewhat of a tangential historian.  Overall, she has noticed dyspnea on exertion over the last 6 months, describes the symptoms with walking up an incline in her driveway.  At times she has had chest discomfort as well but not consistently.  She describes a "cramp" in the left side of her chest that has happened perhaps 3 times in the last 6 months while she was at rest watching television.  She does not report palpitations and has had no syncope.  She tells me that she has not been as active over the last 6 months, had right knee surgery and ultimately bilateral foot surgery.  Unrelated to these procedures she also describes a pain in her legs and a feeling of coldness in her feet that has been more chronic.  She states that both parents had heart attacks although not at premature age.  She does have a personal history of hypertension for which she takes Norvasc.  Prior CT imaging of the abdomen and pelvis done last year did demonstrate incidentally noted abdominal aortic atherosclerosis.  She has not undergone any prior ischemic testing.  I reviewed her recent ECG.  Past Medical History:  Diagnosis Date  . Abnormal Pap smear of cervix   . Anxiety   . Asthma   . Depression   . Essential hypertension   . Fibromyalgia   . GERD (gastroesophageal reflux disease)   . History of kidney stones   . History of stroke   . PVC's (premature ventricular contractions)   . Sciatica of right side     Past Surgical History:  Procedure Laterality Date  . ADENOIDECTOMY    .  APPENDECTOMY    . BUNIONECTOMY Bilateral   . CHONDROPLASTY Right 06/19/2018   Procedure: KNEE ARTHROSCOPY WITH chondroplasty;  Surgeon: Carole Civil, MD;  Location: [facility];  Service: Orthopedics;  Laterality: Right;  . COLONOSCOPY  10/08/2011   Procedure: COLONOSCOPY;  Surgeon: Dorothyann Peng, MD;  Location: [facility];  Service: Endoscopy;  Laterality: N/A;  1:45 PM  . FOOT SURGERY  08/2018  . KNEE SURGERY  05/2018  . TONSILLECTOMY    . TUBAL LIGATION      Current Outpatient Medications  Medication Sig Dispense Refill  . ALPRAZolam (XANAX) 1 MG tablet Take 1 mg by mouth daily as needed for anxiety.   0  . amLODipine (NORVASC) 5 MG tablet Take 5 mg by mouth daily.  0  . budesonide-formoterol (SYMBICORT) 160-4.5 MCG/ACT inhaler Inhale 2 puffs into the lungs 2 (two) times daily.    . furosemide (LASIX) 40 MG tablet Take 40 mg by mouth daily as needed for edema.     Marland Kitchen ibuprofen (ADVIL,MOTRIN) 800 MG tablet     . lubiprostone (AMITIZA) 24 MCG capsule Take 24 mcg by mouth at bedtime.     . methocarbamol (ROBAXIN) 500 MG tablet     . MUCINEX 600 MG 12 hr tablet Take 600 mg by mouth 2 (two) times daily.   0  . pantoprazole (PROTONIX) 40 MG tablet Take 40  mg by mouth daily.    Marland Kitchen PROAIR HFA 108 (90 Base) MCG/ACT inhaler Inhale 2 puffs into the lungs every 6 (six) hours as needed for wheezing or shortness of breath.   1  . topiramate (TOPAMAX) 200 MG tablet Take 200 mg by mouth 2 (two) times daily.     . traMADol (ULTRAM) 50 MG tablet Take 50 mg by mouth every 6 (six) hours as needed.     No current facility-administered medications for this visit.    Allergies:  Lidocaine and Other   Social History: The patient  reports that she quit smoking about 43 years ago. Her smoking use included cigarettes. She has a 0.25 pack-year smoking history. She has never used smokeless tobacco. She reports that she does not drink alcohol or use drugs.   Family History: The patient's family history  includes AAA (abdominal aortic aneurysm) in her father; COPD in her brother; Cancer in her brother; Diverticulosis in her mother; Hypertension in her father and mother.   ROS:  Please see the history of present illness. Otherwise, complete review of systems is positive for improving foot pain.  All other systems are reviewed and negative.   Physical Exam: VS:  BP 120/74 (BP Location: Left Arm)   Pulse 85   Ht 5\' 8"  (1.727 m)   Wt 195 lb (88.5 kg)   LMP 12/13/2015   SpO2 98%   BMI 29.65 kg/m , BMI Body mass index is 29.65 kg/m.  Wt Readings from Last 3 Encounters:  10/23/18 195 lb (88.5 kg)  06/16/18 155 lb (70.3 kg)  05/16/18 153 lb (69.4 kg)    General: Patient appears comfortable at rest. HEENT: Conjunctiva and lids normal, oropharynx clear. Neck: Supple, no elevated JVP or carotid bruits, no thyromegaly. Lungs: Clear to auscultation, nonlabored breathing at rest. Cardiac: Regular rate and rhythm, no S3 or significant systolic murmur, no pericardial rub. Abdomen: Soft, nontender, bowel sounds present. Extremities: No pitting edema, distal pulses 2+. Skin: Warm and dry. Musculoskeletal: No kyphosis. Neuropsychiatric: Alert and oriented x3, affect grossly appropriate.  ECG: I personally reviewed the tracing from Dr. Kathaleen Grinder office which shows sinus tachycardia in 101 bpm.  Recent Labwork: 06/16/2018: BUN 11; Creatinine, Ser 0.98; Hemoglobin 13.1; Platelets 331; Potassium 3.8; Sodium 138   Other Studies Reviewed Today:  CT abdomen and pelvis 01/14/2018: IMPRESSION: 1. No definite explanation for the patient's pain is seen. Only a small tiny amount of fat enters the left inguinal canal region with no significant hernia present. 2. Elongated and tortuous colon.  No diverticulitis. 3. Some prominence of the mucosa of the distal antrum-duodenal bulb. This may well be within normal limits but mild mucosal edema cannot be excluded. 4. Moderate abdominal aortic  atherosclerosis. 5. 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  Assessment and Plan:  1.  Dyspnea on exertion and intermittent chest discomfort as outlined above in a 63 year old woman with hypertension, documented aortic atherosclerosis by CT imaging, asthma, and remote history of tobacco use.  I reviewed her recent ECG which was overall nonspecific.  She has not undergone prior cardiac structural or ischemic testing.  We will arrange an echocardiogram as well as Ray for further investigation.  2.  Bilateral leg pain and feeling of coldness in her feet.  Lower extremity arterial Dopplers and ABIs will be obtained.  3.  Hypertension, on Norvasc.  Blood pressure is well controlled today.  4.  Asthma.  Patient is on Symbicort and has a rescue MDI  which she does not have to use frequently.  She follows with Dr. Luan Pulling..  Current medicines were reviewed with the patient today.   Orders Placed This Encounter  Procedures  . NM Myocar Multi W/Spect W/Wall Motion / EF  . ECHOCARDIOGRAM COMPLETE    Disposition: Call with test results.  Signed, Satira Sark, MD, [facility] 10/23/2018 9:23 AM    [facility]. [address] [place], [place] [address] Phone: [phone]; Fax: [phone]

## 2018-10-23 ENCOUNTER — Ambulatory Visit (HOSPITAL_COMMUNITY)
Admission: RE | Admit: 2018-10-23 | Discharge: 2018-10-23 | Disposition: A | Discharge status: Home / Self Care | Payer: PPO | Source: Ambulatory Visit | Attending: Obstetrics and Gynecology | Admitting: Obstetrics and Gynecology

## 2018-10-23 ENCOUNTER — Encounter: Payer: Self-pay | Admitting: Cardiology

## 2018-10-23 ENCOUNTER — Ambulatory Visit: Payer: PPO | Admitting: Cardiology

## 2018-10-23 VITALS — BP 120/74 | HR 85 | Ht 68.0 in | Wt 195.0 lb

## 2018-10-23 DIAGNOSIS — R0609 Other forms of dyspnea: Secondary | ICD-10-CM | POA: Diagnosis not present

## 2018-10-23 DIAGNOSIS — Z1231 Encounter for screening mammogram for malignant neoplasm of breast: Secondary | ICD-10-CM | POA: Insufficient documentation

## 2018-10-23 DIAGNOSIS — M79604 Pain in right leg: Secondary | ICD-10-CM | POA: Diagnosis not present

## 2018-10-23 DIAGNOSIS — R072 Precordial pain: Secondary | ICD-10-CM

## 2018-10-23 DIAGNOSIS — M79605 Pain in left leg: Secondary | ICD-10-CM

## 2018-10-23 DIAGNOSIS — I10 Essential (primary) hypertension: Secondary | ICD-10-CM

## 2018-10-23 NOTE — Patient Instructions (Signed)
Medication Instructions:  Your physician recommends that you continue on your current medications as directed. Please refer to the Current Medication list given to you today.  If you need a refill on your cardiac medications before your next appointment, please call your pharmacy.   Lab work: None today If you have labs (blood work) drawn today and your tests are completely normal, you will receive your results only by: Marland Kitchen MyChart Message (if you have MyChart) OR . A paper copy in the mail If you have any lab test that is abnormal or we need to change your treatment, we will call you to review the results.  Testing/Procedures: Your physician has requested that you have an echocardiogram. Echocardiography is a painless test that uses sound waves to create images of your heart. It provides your doctor with information about the size and shape of your heart and how well your heart's chambers and valves are working. This procedure takes approximately one hour. There are no restrictions for this procedure.  Your physician has requested that you have a lexiscan myoview. For further information please visit [email or web address]. Please follow instruction sheet, as given.  Your physician has requested that you have a lower  extremity arterial duplex. This test is an ultrasound of the arteries in the legs. It looks at arterial blood flow in the legs and arms. Allow one hour for Lower and Upper Arterial scans. There are no restrictions or special instructions     We will call you with results.

## 2018-10-27 ENCOUNTER — Telehealth: Payer: Self-pay | Admitting: *Deleted

## 2018-10-27 NOTE — Telephone Encounter (Signed)
LMOVM that mammogram was normal.  

## 2018-10-30 ENCOUNTER — Encounter (HOSPITAL_COMMUNITY): Payer: PPO

## 2018-10-30 ENCOUNTER — Ambulatory Visit (HOSPITAL_COMMUNITY)
Admission: RE | Admit: 2018-10-30 | Discharge: 2018-10-30 | Disposition: A | Discharge status: Home / Self Care | Payer: PPO | Source: Ambulatory Visit | Attending: Cardiology | Admitting: Cardiology

## 2018-10-30 ENCOUNTER — Encounter (HOSPITAL_BASED_OUTPATIENT_CLINIC_OR_DEPARTMENT_OTHER)
Admission: RE | Admit: 2018-10-30 | Discharge: 2018-10-30 | Disposition: A | Discharge status: Home / Self Care | Payer: PPO | Source: Ambulatory Visit | Attending: Cardiology | Admitting: Cardiology

## 2018-10-30 ENCOUNTER — Other Ambulatory Visit: Payer: Self-pay

## 2018-10-30 ENCOUNTER — Encounter (HOSPITAL_COMMUNITY): Payer: Self-pay

## 2018-10-30 DIAGNOSIS — R072 Precordial pain: Secondary | ICD-10-CM | POA: Diagnosis not present

## 2018-10-30 DIAGNOSIS — M79604 Pain in right leg: Secondary | ICD-10-CM | POA: Insufficient documentation

## 2018-10-30 DIAGNOSIS — M79605 Pain in left leg: Secondary | ICD-10-CM | POA: Diagnosis not present

## 2018-10-30 DIAGNOSIS — R0609 Other forms of dyspnea: Secondary | ICD-10-CM | POA: Diagnosis not present

## 2018-10-30 DIAGNOSIS — M79662 Pain in left lower leg: Secondary | ICD-10-CM | POA: Diagnosis not present

## 2018-10-30 LAB — NM MYOCAR MULTI W/SPECT W/WALL MOTION / EF
CHL CUP NUCLEAR SRS: 2
LHR: 0.34
LV dias vol: 99 mL (ref 46–106)
LV sys vol: 42 mL
NUC STRESS TID: 1.12
Peak HR: 93 {beats}/min
Rest HR: 68 {beats}/min
SDS: 6
SSS: 8

## 2018-10-30 MED ORDER — TECHNETIUM TC 99M TETROFOSMIN IV KIT
30.0000 | PACK | Freq: Once | INTRAVENOUS | Status: AC | PRN
  Administered 2018-10-30: 33 via INTRAVENOUS

## 2018-10-30 MED ORDER — SODIUM CHLORIDE 0.9% FLUSH
INTRAVENOUS | Status: AC
  Administered 2018-10-30: 10 mL via INTRAVENOUS
  Filled 2018-10-30: qty 10

## 2018-10-30 MED ORDER — REGADENOSON 0.4 MG/5ML IV SOLN
INTRAVENOUS | Status: AC
  Administered 2018-10-30: 0.4 mg via INTRAVENOUS
  Filled 2018-10-30: qty 5

## 2018-10-30 MED ORDER — TECHNETIUM TC 99M TETROFOSMIN IV KIT
10.0000 | PACK | Freq: Once | INTRAVENOUS | Status: AC | PRN
  Administered 2018-10-30: 9.7 via INTRAVENOUS

## 2018-10-30 NOTE — Progress Notes (Signed)
2D Echocardiogram has been performed.  [name] 10/30/2018, 11:37 AM

## 2018-12-08 ENCOUNTER — Other Ambulatory Visit: Payer: Self-pay

## 2018-12-08 ENCOUNTER — Encounter: Payer: Self-pay | Admitting: Gastroenterology

## 2018-12-08 ENCOUNTER — Ambulatory Visit: Payer: PPO | Admitting: Gastroenterology

## 2018-12-08 DIAGNOSIS — K219 Gastro-esophageal reflux disease without esophagitis: Secondary | ICD-10-CM

## 2018-12-08 MED ORDER — DEXLANSOPRAZOLE 60 MG PO CPDR: 60.0000 mg | DELAYED_RELEASE_CAPSULE | Freq: Every day | ORAL | 3 refills | Status: DC

## 2018-12-08 NOTE — Progress Notes (Addendum)
REVIEWED-NO ADDITIONAL RECOMMENDATIONS.  Primary Care Physician:  Sinda Du, MD Primary GI:  Barney Drain, MD   Patient Location: Home  Provider Location: [facility] office  Reason for Visit: GERD  Persons present on the virtual encounter, with roles: Patient, myself (provider),Martina Darrick Grinder LPN(updated meds and allergies)  Total time (minutes) spent on medical discussion: 30 minutes  Due to COVID-19, visit was conducted using Facetime method.  Visit was requested by patient.  Virtual Visit via Facetime  I connected with [name] on 12/08/18 at  8:30 AM EDT by [facility] and verified that I am speaking with the correct person using two identifiers.   I discussed the limitations, risks, security and privacy concerns of performing an evaluation and management service by telephone/video and the availability of in person appointments. I also discussed with the patient that there may be a patient responsible charge related to this service. The patient expressed understanding and agreed to proceed.   HPI:   [name] is a 63 y.o. female who presents for virtual visit regarding GERD at the request of Dr. Luan Pulling.   We last saw the patient in 2013 at time of colonoscopy.  She is due for her next colonoscopy in 2023.  Reports having long-term issues with acid reflux.  She has been on pantoprazole for over 3 years.  Over the past 1 year she has had refractory symptoms, developing more nighttime symptoms.  She added Nexium 20 mg at bedtime over a year ago.  She continues to have breakthrough symptoms, taking baking soda in between to obtain relief.  Her pain starts at the xiphoid process and goes up into the throat.  Burning in quality.  She has been to the cardiologist because there was concern that she may be having cardiac issues.  She also has a history of asthma and states there was some increased difficulty with controlling that as well.  She denies esophageal  dysphagia.  He notes that she takes Mucinex in the morning and Benadryl at night because she always feels like she has a lump in her throat, this seems to help.  Denies any abdominal pain.  She is had about a 20 pound weight gain since January after having foot surgery and becoming nonambulatory.  Regular bowel movements on Amitiza.  No blood in the stool or melena.   Current Outpatient Medications  Medication Sig Dispense Refill   ALPRAZolam (XANAX) 1 MG tablet Take 1 mg by mouth daily as needed for anxiety.   0   amLODipine (NORVASC) 5 MG tablet Take 5 mg by mouth daily.  0   budesonide-formoterol (SYMBICORT) 160-4.5 MCG/ACT inhaler Inhale 2 puffs into the lungs 2 (two) times daily.     diphenhydrAMINE (BENADRYL) 25 mg capsule Take 50 mg by mouth every 6 (six) hours as needed.     esomeprazole (NEXIUM) 20 MG capsule Take 20 mg by mouth at bedtime.     furosemide (LASIX) 40 MG tablet Take 40 mg by mouth daily as needed for edema.      ibuprofen (ADVIL,MOTRIN) 800 MG tablet Take 800 mg by mouth as needed.      lubiprostone (AMITIZA) 24 MCG capsule Take 24 mcg by mouth at bedtime.      methocarbamol (ROBAXIN) 500 MG tablet Take 500 mg by mouth as needed.      MUCINEX 600 MG 12 hr tablet Take 600 mg by mouth daily.   0   pantoprazole (PROTONIX) 40 MG tablet Take 40  mg by mouth daily.     PROAIR HFA 108 (90 Base) MCG/ACT inhaler Inhale 2 puffs into the lungs every 6 (six) hours as needed for wheezing or shortness of breath.   1   topiramate (TOPAMAX) 200 MG tablet Take 200 mg by mouth daily.      traMADol (ULTRAM) 50 MG tablet Take 150 mg by mouth at bedtime.      No current facility-administered medications for this visit.     Past Medical History:  Diagnosis Date   Abnormal Pap smear of cervix    Anxiety    Asthma    Depression    Essential hypertension    Fibromyalgia    GERD (gastroesophageal reflux disease)    History of kidney stones    History of stroke      PVC's (premature ventricular contractions)    Sciatica of right side     Past Surgical History:  Procedure Laterality Date   ADENOIDECTOMY     APPENDECTOMY     BUNIONECTOMY Bilateral    CHONDROPLASTY Right 06/19/2018   Procedure: KNEE ARTHROSCOPY WITH chondroplasty;  Surgeon: Carole Civil, MD;  Location: [facility];  Service: Orthopedics;  Laterality: Right;   COLONOSCOPY  10/08/2011   diverticulosis, hyperplastic polyp removed. next tcs 2023.   FOOT SURGERY  08/2018   KNEE SURGERY  05/2018   TONSILLECTOMY     TUBAL LIGATION      Family History  Problem Relation Age of Onset   Hypertension Mother    Diverticulosis Mother    Hypertension Father    AAA (abdominal aortic aneurysm) Father    Cancer Brother        not sure where primary, was metastatic. was jaundice.    COPD Brother    Angioedema Neg Hx    Atopy Neg Hx    Eczema Neg Hx    Immunodeficiency Neg Hx    Urticaria Neg Hx    Asthma Neg Hx    Allergic rhinitis Neg Hx    Colon cancer Neg Hx     Social History   Socioeconomic History   Marital status: Married    Spouse name: Not on file   Number of children: Not on file   Years of education: Not on file   Highest education level: Not on file  Occupational History   Not on file  Social Needs   Financial resource strain: Not on file   Food insecurity:    Worry: Not on file    Inability: Not on file   Transportation needs:    Medical: Not on file    Non-medical: Not on file  Tobacco Use   Smoking status: Former Smoker    Packs/day: 0.25    Years: 1.00    Pack years: 0.25    Types: Cigarettes    Last attempt to quit: 06/17/1975    Years since quitting: 43.5   Smokeless tobacco: Never Used  Substance and Sexual Activity   Alcohol use: No   Drug use: No   Sexual activity: Not Currently    Birth control/protection: Surgical, Post-menopausal    Comment: tubal  Lifestyle   Physical activity:    Days per  week: Not on file    Minutes per session: Not on file   Stress: Not on file  Relationships   Social connections:    Talks on phone: Not on file    Gets together: Not on file    Attends religious service: Not on file  Active member of club or organization: Not on file    Attends meetings of clubs or organizations: Not on file    Relationship status: Not on file   Intimate partner violence:    Fear of current or ex partner: Not on file    Emotionally abused: Not on file    Physically abused: Not on file    Forced sexual activity: Not on file  Other Topics Concern   Not on file  Social History Narrative   Not on file      ROS:  General: Negative for anorexia, weight loss, fever, chills, fatigue, weakness. Eyes: Negative for vision changes.  ENT: Negative for hoarseness, difficulty swallowing , nasal congestion. CV: Negative for chest pain, angina, palpitations, dyspnea on exertion, peripheral edema.  Respiratory: Negative for dyspnea at rest, dyspnea on exertion, cough, sputum, wheezing.  GI: See history of present illness. GU:  Negative for dysuria, hematuria, urinary incontinence, urinary frequency, nocturnal urination.  MS: Positive foot pain, no low back pain.  Derm: Negative for rash or itching.  Neuro: Negative for weakness, abnormal sensation, seizure, frequent headaches, memory loss, confusion.  Psych: Negative for anxiety, depression, suicidal ideation, hallucinations.  Endo: Negative for unusual weight change.  Heme: Negative for bruising or bleeding. Allergy: Negative for rash or hives.   Observations/Objective: Pleasant well-nourished well-developed Caucasian female in no acute distress.  Ambulating throughout the visit.  No jaundice.  No DOE or shortness of breath.  Otherwise exam not available.   Lab Results  Component Value Date   WBC 6.7 06/16/2018   HGB 13.1 06/16/2018   HCT 43.1 06/16/2018   MCV 94.3 06/16/2018   PLT 331 06/16/2018   Lab  Results  Component Value Date   CREATININE 0.98 06/16/2018   BUN 11 06/16/2018   NA 138 06/16/2018   K 3.8 06/16/2018   CL 109 06/16/2018   CO2 21 (L) 06/16/2018    Assessment and Plan: Pleasant 63 year old female presenting with refractory reflux symptoms.  Symptoms poorly controlled on pantoprazole 40 mg daily.  Has been on Nexium 20 mg at bedtime for over a year and takes baking soda in between for symptom relief.  Denies any dysphagia.  No weight loss.  Has had some nighttime regurgitation.  Symptoms likely exacerbated more recently by her weight gain although she clearly has had chronic refractory reflux by her description for more than a year.  She would benefit from upper endoscopy.  Right now she would be considered elective and this will need to be postponed due to COVID-19 crisis.  Offered to schedule her in July, she would like to call back to schedule when she is able to work out her personal schedule, daughter is having a baby in June.  Patient also needs to have a redo foot surgery.  In the interim reinforced antireflux measures.  We will switch her PPI therapy to Dexilant 60 mg daily before breakfast.  She is aware this will take place of both her pantoprazole and Nexium because it is essentially dual release medication.  Follow Up Instructions:    I discussed the assessment and treatment plan with the patient. The patient was provided an opportunity to ask questions and all were answered. The patient agreed with the plan and demonstrated an understanding of the instructions. AVS mailed to patient's home address.   The patient was advised to call back or seek an in-person evaluation if the symptoms worsen or if the condition fails to improve as anticipated.  I provided 30 minutes of virtual face-to-face time during this encounter.   Neil Crouch, PA-C

## 2018-12-08 NOTE — Addendum Note (Signed)
Addended by: Mahala Menghini on: 12/08/2018 10:02 AM   Modules accepted: Level of Service

## 2018-12-08 NOTE — Progress Notes (Signed)
cc'ed to pcp °

## 2018-12-08 NOTE — Patient Instructions (Signed)
1. Stop pantoprazole and Nexium. 2. Start Dexilant 60 mg, 30 minutes before breakfast daily.  Ask your pharmacy about Dexilant rebate cards, this can be found online and often the pharmacy would activate on their own to reduce your co-pay. 3. Would recommend upper endoscopy to evaluate difficult to control reflux.  We are scheduling for July to allow for COVID-19 crisis to improve.  Call us when you are ready to get scheduled. 4. Return office visit in 6 months. 5. Call sooner if your reflux symptoms have not improved.    Gastroesophageal Reflux Disease, Adult Gastroesophageal reflux (GER) happens when acid from the stomach flows up into the tube that connects the mouth and the stomach (esophagus). Normally, food travels down the esophagus and stays in the stomach to be digested. However, when a person has GER, food and stomach acid sometimes move back up into the esophagus. If this becomes a more serious problem, the person may be diagnosed with a disease called gastroesophageal reflux disease (GERD). GERD occurs when the reflux:  Happens often.  Causes frequent or severe symptoms.  Causes problems such as damage to the esophagus. When stomach acid comes in contact with the esophagus, the acid may cause soreness (inflammation) in the esophagus. Over time, GERD may create small holes (ulcers) in the lining of the esophagus. What are the causes? This condition is caused by a problem with the muscle between the esophagus and the stomach (lower esophageal sphincter, or LES). Normally, the LES muscle closes after food passes through the esophagus to the stomach. When the LES is weakened or abnormal, it does not close properly, and that allows food and stomach acid to go back up into the esophagus. The LES can be weakened by certain dietary substances, medicines, and medical conditions, including:  Tobacco use.  Pregnancy.  Having a hiatal hernia.  Alcohol use.  Certain foods and beverages,  such as coffee, chocolate, onions, and peppermint. What increases the risk? You are more likely to develop this condition if you:  Have an increased body weight.  Have a connective tissue disorder.  Use NSAID medicines. What are the signs or symptoms? Symptoms of this condition include:  Heartburn.  Difficult or painful swallowing.  The feeling of having a lump in the throat.  Abitter taste in the mouth.  Bad breath.  Having a large amount of saliva.  Having an upset or bloated stomach.  Belching.  Chest pain. Different conditions can cause chest pain. Make sure you see your health care provider if you experience chest pain.  Shortness of breath or wheezing.  Ongoing (chronic) cough or a night-time cough.  Wearing away of tooth enamel.  Weight loss. How is this diagnosed? Your health care provider will take a medical history and perform a physical exam. To determine if you have mild or severe GERD, your health care provider may also monitor how you respond to treatment. You may also have tests, including:  A test to examine your stomach and esophagus with a small camera (endoscopy).  A test thatmeasures the acidity level in your esophagus.  A test thatmeasures how much pressure is on your esophagus.  A barium swallow or modified barium swallow test to show the shape, size, and functioning of your esophagus. How is this treated? The goal of treatment is to help relieve your symptoms and to prevent complications. Treatment for this condition may vary depending on how severe your symptoms are. Your health care provider may recommend:  Changes to your  diet.  Medicine.  Surgery. Follow these instructions at home: Eating and drinking   Follow a diet as recommended by your health care provider. This may involve avoiding foods and drinks such as: ? Coffee and tea (with or without caffeine). ? Drinks that containalcohol. ? Energy drinks and sports  drinks. ? Carbonated drinks or sodas. ? Chocolate and cocoa. ? Peppermint and mint flavorings. ? Garlic and onions. ? Horseradish. ? Spicy and acidic foods, including peppers, chili powder, curry powder, vinegar, hot sauces, and barbecue sauce. ? Citrus fruit juices and citrus fruits, such as oranges, lemons, and limes. ? Tomato-based foods, such as red sauce, chili, salsa, and pizza with red sauce. ? Fried and fatty foods, such as donuts, french fries, potato chips, and high-fat dressings. ? High-fat meats, such as hot dogs and fatty cuts of red and white meats, such as rib eye steak, sausage, ham, and bacon. ? High-fat dairy items, such as whole milk, butter, and cream cheese.  Eat small, frequent meals instead of large meals.  Avoid drinking large amounts of liquid with your meals.  Avoid eating meals during the 2-3 hours before bedtime.  Avoid lying down right after you eat.  Do not exercise right after you eat. Lifestyle   Do not use any products that contain nicotine or tobacco, such as cigarettes, e-cigarettes, and chewing tobacco. If you need help quitting, ask your health care provider.  Try to reduce your stress by using methods such as yoga or meditation. If you need help reducing stress, ask your health care provider.  If you are overweight, reduce your weight to an amount that is healthy for you. Ask your health care provider for guidance about a safe weight loss goal. General instructions  Pay attention to any changes in your symptoms.  Take over-the-counter and prescription medicines only as told by your health care provider. Do not take aspirin, ibuprofen, or other NSAIDs unless your health care provider told you to do so.  Wear loose-fitting clothing. Do not wear anything tight around your waist that causes pressure on your abdomen.  Raise (elevate) the head of your bed about 6 inches (15 cm).  Avoid bending over if this makes your symptoms worse.  Keep all  follow-up visits as told by your health care provider. This is important. Contact a health care provider if:  You have: ? New symptoms. ? Unexplained weight loss. ? Difficulty swallowing or it hurts to swallow. ? Wheezing or a persistent cough. ? A hoarse voice.  Your symptoms do not improve with treatment. Get help right away if you:  Have pain in your arms, neck, jaw, teeth, or back.  Feel sweaty, dizzy, or light-headed.  Have chest pain or shortness of breath.  Vomit and your vomit looks like blood or coffee grounds.  Faint.  Have stool that is bloody or black.  Cannot swallow, drink, or eat. Summary  Gastroesophageal reflux happens when acid from the stomach flows up into the esophagus. GERD is a disease in which the reflux happens often, causes frequent or severe symptoms, or causes problems such as damage to the esophagus.  Treatment for this condition may vary depending on how severe your symptoms are. Your health care provider may recommend diet and lifestyle changes, medicine, or surgery.  Contact a health care provider if you have new or worsening symptoms.  Take over-the-counter and prescription medicines only as told by your health care provider. Do not take aspirin, ibuprofen, or other NSAIDs unless your  health care provider told you to do so.  Keep all follow-up visits as told by your health care provider. This is important. This information is not intended to replace advice given to you by your health care provider. Make sure you discuss any questions you have with your health care provider. Document Released: 05/16/2005 Document Revised: 02/12/2018 Document Reviewed: 02/12/2018 Elsevier Interactive Patient Education  2019 [employer] for Gastroesophageal Reflux Disease, Adult When you have gastroesophageal reflux disease (GERD), the foods you eat and your eating habits are very important. Choosing the right foods can help ease the  discomfort of GERD. Consider working with a diet and nutrition specialist (dietitian) to help you make healthy food choices. What general guidelines should I follow?  Eating plan  Choose healthy foods low in fat, such as fruits, vegetables, whole grains, low-fat dairy products, and lean meat, fish, and poultry.  Eat frequent, small meals instead of three large meals each day. Eat your meals slowly, in a relaxed setting. Avoid bending over or lying down until 2-3 hours after eating.  Limit high-fat foods such as fatty meats or fried foods.  Limit your intake of oils, butter, and shortening to less than 8 teaspoons each day.  Avoid the following: ? Foods that cause symptoms. These may be different for different people. Keep a food diary to keep track of foods that cause symptoms. ? Alcohol. ? Drinking large amounts of liquid with meals. ? Eating meals during the 2-3 hours before bed.  Cook foods using methods other than frying. This may include baking, grilling, or broiling. Lifestyle  Maintain a healthy weight. Ask your health care provider what weight is healthy for you. If you need to lose weight, work with your health care provider to do so safely.  Exercise for at least 30 minutes on 5 or more days each week, or as told by your health care provider.  Avoid wearing clothes that fit tightly around your waist and chest.  Do not use any products that contain nicotine or tobacco, such as cigarettes and e-cigarettes. If you need help quitting, ask your health care provider.  Sleep with the head of your bed raised. Use a wedge under the mattress or blocks under the bed frame to raise the head of the bed. What foods are not recommended? The items listed may not be a complete list. Talk with your dietitian about what dietary choices are best for you. Grains Pastries or quick breads with added fat. [place] toast. Vegetables Deep fried vegetables. [place] fries. Any vegetables prepared with  added fat. Any vegetables that cause symptoms. For some people this may include tomatoes and tomato products, chili peppers, onions and garlic, and horseradish. Fruits Any fruits prepared with added fat. Any fruits that cause symptoms. For some people this may include citrus fruits, such as oranges, grapefruit, pineapple, and lemons. Meats and other protein foods High-fat meats, such as fatty beef or pork, hot dogs, ribs, ham, sausage, salami and bacon. Fried meat or protein, including fried fish and fried chicken. Nuts and nut butters. Dairy Whole milk and chocolate milk. Sour cream. Cream. Ice cream. Cream cheese. Milk shakes. Beverages Coffee and tea, with or without caffeine. Carbonated beverages. Sodas. Energy drinks. Fruit juice made with acidic fruits (such as orange or grapefruit). Tomato juice. Alcoholic drinks. Fats and oils Butter. Margarine. Shortening. Ghee. Sweets and desserts Chocolate and cocoa. Donuts. Seasoning and other foods Pepper. Peppermint and spearmint. Any condiments, herbs, or seasonings  that cause symptoms. For some people, this may include curry, hot sauce, or vinegar-based salad dressings. Summary  When you have gastroesophageal reflux disease (GERD), food and lifestyle choices are very important to help ease the discomfort of GERD.  Eat frequent, small meals instead of three large meals each day. Eat your meals slowly, in a relaxed setting. Avoid bending over or lying down until 2-3 hours after eating.  Limit high-fat foods such as fatty meat or fried foods. This information is not intended to replace advice given to you by your health care provider. Make sure you discuss any questions you have with your health care provider. Document Released: 08/06/2005 Document Revised: 08/07/2016 Document Reviewed: 08/07/2016 Elsevier Interactive Patient Education  2019 [employer].

## 2018-12-09 ENCOUNTER — Encounter: Payer: Self-pay | Admitting: Gastroenterology

## 2018-12-25 DIAGNOSIS — I10 Essential (primary) hypertension: Secondary | ICD-10-CM | POA: Diagnosis not present

## 2018-12-25 DIAGNOSIS — J45909 Unspecified asthma, uncomplicated: Secondary | ICD-10-CM | POA: Diagnosis not present

## 2018-12-25 DIAGNOSIS — K210 Gastro-esophageal reflux disease with esophagitis: Secondary | ICD-10-CM | POA: Diagnosis not present

## 2018-12-25 DIAGNOSIS — J301 Allergic rhinitis due to pollen: Secondary | ICD-10-CM | POA: Diagnosis not present

## 2018-12-31 ENCOUNTER — Other Ambulatory Visit: Payer: Self-pay | Admitting: Obstetrics and Gynecology

## 2019-01-01 DIAGNOSIS — M2042 Other hammer toe(s) (acquired), left foot: Secondary | ICD-10-CM | POA: Diagnosis not present

## 2019-01-01 DIAGNOSIS — M79672 Pain in left foot: Secondary | ICD-10-CM | POA: Diagnosis not present

## 2019-01-05 ENCOUNTER — Other Ambulatory Visit: Payer: Self-pay | Admitting: Obstetrics and Gynecology

## 2019-01-05 MED ORDER — PHENTERMINE HCL 37.5 MG PO TABS: ORAL_TABLET | ORAL | 1 refills | Status: DC

## 2019-01-05 NOTE — Progress Notes (Signed)
Refilled phentermine 1/2 tab q 3 d.

## 2019-01-19 ENCOUNTER — Telehealth: Payer: Self-pay | Admitting: *Deleted

## 2019-01-19 ENCOUNTER — Other Ambulatory Visit: Payer: Self-pay | Admitting: *Deleted

## 2019-01-19 DIAGNOSIS — K219 Gastro-esophageal reflux disease without esophagitis: Secondary | ICD-10-CM

## 2019-01-19 NOTE — Telephone Encounter (Signed)
Called patient and she is scheduled for EGD with [facility] 03/12/2019 at 8:30am. Confirmed mailing address and is aware will mail instructions to her. Orders entered.

## 2019-02-09 ENCOUNTER — Other Ambulatory Visit: Payer: Self-pay

## 2019-02-09 ENCOUNTER — Other Ambulatory Visit: Payer: PPO

## 2019-02-09 DIAGNOSIS — Z20822 Contact with and (suspected) exposure to covid-19: Secondary | ICD-10-CM

## 2019-02-09 NOTE — Progress Notes (Signed)
[id number]

## 2019-02-13 ENCOUNTER — Ambulatory Visit: Payer: PPO | Admitting: Orthopedic Surgery

## 2019-02-13 DIAGNOSIS — J45909 Unspecified asthma, uncomplicated: Secondary | ICD-10-CM | POA: Diagnosis not present

## 2019-02-13 DIAGNOSIS — I10 Essential (primary) hypertension: Secondary | ICD-10-CM | POA: Diagnosis not present

## 2019-02-13 DIAGNOSIS — F419 Anxiety disorder, unspecified: Secondary | ICD-10-CM | POA: Diagnosis not present

## 2019-02-13 LAB — NOVEL CORONAVIRUS, NAA: SARS-CoV-2, NAA: NOT DETECTED

## 2019-02-18 ENCOUNTER — Telehealth: Payer: Self-pay | Admitting: *Deleted

## 2019-02-18 NOTE — Telephone Encounter (Signed)
Called patient and is aware she must go for COVID-19 testing prior to procedure. She must remained quarantined after testing until procedure is done. She voiced understanding. Patient scheduled for 7/20 at 9:00am. Patient aware of testing site.

## 2019-02-24 ENCOUNTER — Other Ambulatory Visit: Payer: Self-pay

## 2019-02-24 ENCOUNTER — Ambulatory Visit (INDEPENDENT_AMBULATORY_CARE_PROVIDER_SITE_OTHER): Payer: PPO | Admitting: Obstetrics and Gynecology

## 2019-02-24 ENCOUNTER — Encounter: Payer: Self-pay | Admitting: Obstetrics and Gynecology

## 2019-02-24 VITALS — BP 151/96 | HR 82 | Wt 181.0 lb

## 2019-02-24 DIAGNOSIS — Z01419 Encounter for gynecological examination (general) (routine) without abnormal findings: Secondary | ICD-10-CM | POA: Diagnosis not present

## 2019-02-24 NOTE — Addendum Note (Signed)
Addended by: Octaviano Glow on: 02/24/2019 03:56 PM   Modules accepted: Orders

## 2019-02-24 NOTE — Progress Notes (Signed)
[facility] Visit  @DATE @            Patient name: [name] MRN [id number]  Date of birth: August 13, 1956  CC & HPI:  [name] is a 63 y.o. female presenting today for a combination of emotional issues and physical concerns.  She is "I am so far down the rabbit hole", reference to [place] in [place],, she is not suicidal.  She is happy caring for her grandchildren.  She recently had a grandchild born, good outcome she does not care for the child has her daughter "was well trained and can take care of things." Her main concerns are as follows: 1 feels like crap with foot malaise and decreased activity after in October knee surgery that went well, in November 14 left toe surgery that did not not help as the pin came out early and the toe shifted under the adjacent third toe pin was redone January 9 and did not help again.  COVID restrictions have prevented the third surgery that will be necessary to re-break the toe and straighten it again She notes some hot flashes. Thirdly she complains of insomnia she is up at 4 AM "does not sleep at all   ROS:  ROS She takes meloxicam which seems to be contributing to GI upset.  She can take tramadol and has plenty of this from her podiatrist. She is on paroxetine 10 mg daily from Dr. Luan Pulling and has refills advised to stay on this She has not been taking her phentermine which seems to give her an amazing increased energy and helps her with her fibromyalgia.  We have used this for years.  She is recently discontinued with all her other activity.  She is gained weight with her inactivity due to her foot  Pertinent History Reviewed:   Reviewed: Significant for discussed July is awful due to the combination of memories about her son's death  this time of year, and activity restrictions from COVID risk Medical         Past Medical History:  Diagnosis Date  . Abnormal Pap smear of cervix   . Anxiety   . Asthma   . Depression   .  Essential hypertension   . Fibromyalgia   . GERD (gastroesophageal reflux disease)   . History of kidney stones   . History of stroke   . PVC's (premature ventricular contractions)   . Sciatica of right side                               Surgical Hx:    Past Surgical History:  Procedure Laterality Date  . ADENOIDECTOMY    . APPENDECTOMY    . BUNIONECTOMY Bilateral   . CHONDROPLASTY Right 06/19/2018   Procedure: KNEE ARTHROSCOPY WITH chondroplasty;  Surgeon: Carole Civil, MD;  Location: [facility];  Service: Orthopedics;  Laterality: Right;  . COLONOSCOPY  10/08/2011   diverticulosis, hyperplastic polyp removed. next tcs 2023.  Marland Kitchen FOOT SURGERY  08/2018  . KNEE SURGERY  05/2018  . TONSILLECTOMY    . TUBAL LIGATION     Medications: Reviewed & Updated - see associated section                       Current Outpatient Medications:  .  ALPRAZolam (XANAX) 1 MG tablet, Take 1 mg by mouth daily as needed for anxiety. , Disp: ,  Rfl: 0 .  amLODipine (NORVASC) 5 MG tablet, Take 5 mg by mouth daily., Disp: , Rfl: 0 .  budesonide-formoterol (SYMBICORT) 160-4.5 MCG/ACT inhaler, Inhale 2 puffs into the lungs 2 (two) times daily., Disp: , Rfl:  .  dexlansoprazole (DEXILANT) 60 MG capsule, Take 1 capsule (60 mg total) by mouth daily before breakfast., Disp: 90 capsule, Rfl: 3 .  diphenhydrAMINE (BENADRYL) 25 mg capsule, Take 50 mg by mouth every 6 (six) hours as needed., Disp: , Rfl:  .  furosemide (LASIX) 40 MG tablet, Take 40 mg by mouth daily as needed for edema. , Disp: , Rfl:  .  ibuprofen (ADVIL,MOTRIN) 800 MG tablet, Take 800 mg by mouth as needed. , Disp: , Rfl:  .  lubiprostone (AMITIZA) 24 MCG capsule, Take 24 mcg by mouth at bedtime. , Disp: , Rfl:  .  methocarbamol (ROBAXIN) 500 MG tablet, Take 500 mg by mouth as needed. , Disp: , Rfl:  .  MUCINEX 600 MG 12 hr tablet, Take 600 mg by mouth daily. , Disp: , Rfl: 0 .  phentermine (ADIPEX-P) 37.5 MG tablet, TAKE ONE-HALF TO ONE TABLET EVERY  3 DAYSFOR FATIGUE, Disp: 30 tablet, Rfl: 1 .  PROAIR HFA 108 (90 Base) MCG/ACT inhaler, Inhale 2 puffs into the lungs every 6 (six) hours as needed for wheezing or shortness of breath. , Disp: , Rfl: 1 .  topiramate (TOPAMAX) 200 MG tablet, Take 200 mg by mouth daily. , Disp: , Rfl:  .  traMADol (ULTRAM) 50 MG tablet, Take 150 mg by mouth at bedtime. , Disp: , Rfl:    Social History: Reviewed -  reports that she quit smoking about 43 years ago. Her smoking use included cigarettes. She has a 0.25 pack-year smoking history. She has never used smokeless tobacco.  Objective Findings:  Vitals: Blood pressure (!) 151/96, pulse 82, weight 181 lb (82.1 kg), last menstrual period 12/13/2015.  PHYSICAL EXAMINATION General appearance - anxious and appears hypervigilant and somewhat stressed Mental status - alert, oriented to person, place, and time Chest - not examined, clear unlabored breathing Heart - normal rate and regular rhythm Abdomen - soft, nontender, nondistended, no masses or organomegaly Breasts - breasts appear normal, no suspicious masses, no skin or nipple changes or axillary nodes, unchanged from previous exams Skin - normal coloration and turgor, no rashes, no suspicious skin lesions noted Extremities: Left foot has the second toe subluxed under the third toe.  Patient cannot wear her regular shoes PELVIC External genitalia -normal female for age 44 -well estrogenized Vagina -normal secretions Cervix -normal in appearance, her last Pap 2018, discussed and will not repeat at this time  Uterus -mobile well supported nontender  Adnexa -nontender without masses [place] -not done Rectal - rectal exam not indicated    Assessment & Plan:   A:  1. Normal female exam 2. Weight gain due to inactivity and discontinuation of phentermine which helps her both with weight gain and fibromyalgia 3.   P:  1. Renew paroxetine 2. Restart phentermine 3. Avoid meloxicam to avoid GI  upset 4. Once patient has been on the medicines as recommended above for 6 weeks she will notify me and if she still needs to be considered for a sleep medication such as Remeron I will handle that by phone call .  If Remeron does not work would try Ambien.  The provider spent over 25 minutes with the visit, including pre visit review, documentation, with >than 50% spent in counseling and  coordination of care.

## 2019-03-09 ENCOUNTER — Other Ambulatory Visit (HOSPITAL_COMMUNITY)
Admission: RE | Admit: 2019-03-09 | Discharge: 2019-03-09 | Disposition: A | Discharge status: Home / Self Care | Payer: PPO | Source: Ambulatory Visit | Attending: Gastroenterology | Admitting: Gastroenterology

## 2019-03-09 ENCOUNTER — Other Ambulatory Visit: Payer: Self-pay

## 2019-03-09 DIAGNOSIS — Z1159 Encounter for screening for other viral diseases: Secondary | ICD-10-CM | POA: Diagnosis not present

## 2019-03-10 LAB — SARS CORONAVIRUS 2 (TAT 6-24 HRS): SARS Coronavirus 2: NEGATIVE

## 2019-03-12 ENCOUNTER — Encounter (HOSPITAL_COMMUNITY): Payer: Self-pay

## 2019-03-12 ENCOUNTER — Encounter (HOSPITAL_COMMUNITY)
Admission: RE | Disposition: A | Discharge status: Home / Self Care | Payer: Self-pay | Source: Ambulatory Visit | Attending: Gastroenterology

## 2019-03-12 ENCOUNTER — Ambulatory Visit (HOSPITAL_COMMUNITY)
Admission: RE | Admit: 2019-03-12 | Discharge: 2019-03-12 | Disposition: A | Discharge status: Home / Self Care | Payer: PPO | Source: Ambulatory Visit | Attending: Gastroenterology | Admitting: Gastroenterology

## 2019-03-12 ENCOUNTER — Other Ambulatory Visit: Payer: Self-pay

## 2019-03-12 DIAGNOSIS — J45909 Unspecified asthma, uncomplicated: Secondary | ICD-10-CM | POA: Insufficient documentation

## 2019-03-12 DIAGNOSIS — Z8673 Personal history of transient ischemic attack (TIA), and cerebral infarction without residual deficits: Secondary | ICD-10-CM | POA: Diagnosis not present

## 2019-03-12 DIAGNOSIS — Z79899 Other long term (current) drug therapy: Secondary | ICD-10-CM | POA: Insufficient documentation

## 2019-03-12 DIAGNOSIS — F419 Anxiety disorder, unspecified: Secondary | ICD-10-CM | POA: Insufficient documentation

## 2019-03-12 DIAGNOSIS — I10 Essential (primary) hypertension: Secondary | ICD-10-CM | POA: Insufficient documentation

## 2019-03-12 DIAGNOSIS — K219 Gastro-esophageal reflux disease without esophagitis: Secondary | ICD-10-CM | POA: Diagnosis not present

## 2019-03-12 DIAGNOSIS — Z87891 Personal history of nicotine dependence: Secondary | ICD-10-CM | POA: Insufficient documentation

## 2019-03-12 DIAGNOSIS — K449 Diaphragmatic hernia without obstruction or gangrene: Secondary | ICD-10-CM | POA: Diagnosis not present

## 2019-03-12 DIAGNOSIS — Z888 Allergy status to other drugs, medicaments and biological substances status: Secondary | ICD-10-CM | POA: Diagnosis not present

## 2019-03-12 DIAGNOSIS — K2970 Gastritis, unspecified, without bleeding: Secondary | ICD-10-CM | POA: Diagnosis not present

## 2019-03-12 DIAGNOSIS — K311 Adult hypertrophic pyloric stenosis: Secondary | ICD-10-CM

## 2019-03-12 DIAGNOSIS — K3184 Gastroparesis: Secondary | ICD-10-CM | POA: Insufficient documentation

## 2019-03-12 DIAGNOSIS — M797 Fibromyalgia: Secondary | ICD-10-CM | POA: Insufficient documentation

## 2019-03-12 DIAGNOSIS — Z79891 Long term (current) use of opiate analgesic: Secondary | ICD-10-CM | POA: Diagnosis not present

## 2019-03-12 DIAGNOSIS — R1013 Epigastric pain: Secondary | ICD-10-CM | POA: Diagnosis not present

## 2019-03-12 DIAGNOSIS — F329 Major depressive disorder, single episode, unspecified: Secondary | ICD-10-CM | POA: Diagnosis not present

## 2019-03-12 DIAGNOSIS — K222 Esophageal obstruction: Secondary | ICD-10-CM | POA: Diagnosis not present

## 2019-03-12 DIAGNOSIS — Z7951 Long term (current) use of inhaled steroids: Secondary | ICD-10-CM | POA: Diagnosis not present

## 2019-03-12 HISTORY — PX: POLYPECTOMY: SHX5525

## 2019-03-12 HISTORY — PX: ESOPHAGOGASTRODUODENOSCOPY: SHX5428

## 2019-03-12 SURGERY — EGD (ESOPHAGOGASTRODUODENOSCOPY)
Anesthesia: Moderate Sedation

## 2019-03-12 MED ORDER — MIDAZOLAM HCL 5 MG/5ML IJ SOLN
INTRAMUSCULAR | Status: AC
  Filled 2019-03-12: qty 10

## 2019-03-12 MED ORDER — SODIUM CHLORIDE 0.9 % IV SOLN
INTRAVENOUS | Status: DC
  Administered 2019-03-12: 08:00:00 via INTRAVENOUS

## 2019-03-12 MED ORDER — LIDOCAINE VISCOUS HCL 2 % MT SOLN
OROMUCOSAL | Status: AC
  Filled 2019-03-12: qty 15

## 2019-03-12 MED ORDER — MEPERIDINE HCL 100 MG/ML IJ SOLN
INTRAMUSCULAR | Status: AC
  Filled 2019-03-12: qty 2

## 2019-03-12 MED ORDER — MIDAZOLAM HCL 5 MG/5ML IJ SOLN
INTRAMUSCULAR | Status: DC | PRN
  Administered 2019-03-12 (×3): 2 mg via INTRAVENOUS
  Administered 2019-03-12: 1 mg via INTRAVENOUS
  Administered 2019-03-12: 2 mg via INTRAVENOUS

## 2019-03-12 MED ORDER — MEPERIDINE HCL 100 MG/ML IJ SOLN
INTRAMUSCULAR | Status: DC | PRN
  Administered 2019-03-12 (×2): 25 mg via INTRAVENOUS
  Administered 2019-03-12: 50 mg via INTRAVENOUS

## 2019-03-12 MED ORDER — BUTAMBEN-TETRACAINE-BENZOCAINE 2-2-14 % EX AERO
INHALATION_SPRAY | CUTANEOUS | Status: AC
  Filled 2019-03-12: qty 5

## 2019-03-12 NOTE — Discharge Instructions (Signed)
THE EXAM OF YOUR STOMACH WAS INCOMPLETE BECAUSE YOU HAD FOOD IN YOUR STOMACH. I SAW ~2/3 OF THE STOMACH LINING.  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 I biopsied your stomach.   TO BETTER CONTROL YOUR REFLUX AND REDUCE CONSTIPATION, I RECOMMEND YOU READ AND FOLLOW RECOMMENDATIONS BY DR. MARK HYMAN, "10-DAY DETOX DIET".   TO CONTROL HEARTBURN/REFLYUX/REGURGITATION:    1. DRINK WATER TO KEEP YOUR URINE LIGHT YELLOW. STRICTLY AVOID CAFFEINE.    2. CONTINUE YOUR WEIGHT LOSS EFFORTS.     3. Avoid reflux triggers. MEATS SHOULD BE BAKED, BROILED, OR BOILED. AVOID FRIED FOODS.  SEE INFO BELOW.    4. CONTINUE DEXILANT.    5. USE PEPCID OR TAGAMET FOR BREAKTHROUGH HEARTBURN/REFLUX.    6. SLEEP WITH ON A WEDGE OR PUT THE HEAD OF YOUR BED ON 6 INCH BLOCKS TO KEEP YOUR HEAD ABOVE YOUR HEART WHILE YOU SLEEP.   YOUR BIOPSY RESULTS WILL BE BACK IN 5 BUSINESS DAYS.  COMPLETE A GASTRIC EMPTYING STUDY WITHIN THE NEXT 2-3 WEEKS. If you have delayed gastric emptying, you will not be a candidate for reflux surgery.   FOLLOW UP IN 6 MOS.   UPPER ENDOSCOPY AFTER CARE Read the instructions outlined below and refer to this sheet in the next week. These discharge instructions provide you with general information on caring for yourself after you leave the hospital. While your treatment has been planned according to the most current medical practices available, unavoidable complications occasionally occur. If you have any problems or questions after discharge, call DR. Kathline Banbury, [phone].  ACTIVITY  You may resume your regular activity, but move at a slower pace for the next 24 hours.   Take frequent rest periods for the next 24 hours.   Walking will help get rid of the air and reduce the bloated feeling in your belly (abdomen).   No driving for 24 hours (because of the medicine (anesthesia) used during  the test).   You may shower.   Do not sign any important legal documents or operate any machinery for 24 hours (because of the anesthesia used during the test).    NUTRITION  Drink plenty of fluids.   You may resume your normal diet as instructed by your doctor.   Begin with a light meal and progress to your normal diet. Heavy or fried foods are harder to digest and may make you feel sick to your stomach (nauseated).   Avoid alcoholic beverages for 24 hours or as instructed.    MEDICATIONS  You may resume your normal medications.   WHAT YOU CAN EXPECT TODAY  Some feelings of bloating in the abdomen.   Passage of more gas than usual.    IF YOU HAD A BIOPSY TAKEN DURING THE UPPER ENDOSCOPY:  Eat a soft diet IF YOU HAVE NAUSEA, BLOATING, ABDOMINAL PAIN, OR VOMITING.    FINDING OUT THE RESULTS OF YOUR TEST Not all test results are available during your visit. DR. Oneida Alar WILL CALL YOU WITHIN 7 DAYS OF YOUR PROCEDUE WITH YOUR RESULTS. Do not assume everything is normal if you have not heard from DR. Awab Abebe IN ONE WEEK, CALL HER OFFICE AT [phone].  SEEK IMMEDIATE MEDICAL ATTENTION AND CALL THE OFFICE: [phone] IF:  You have more than a spotting of blood in your stool.   Your belly is swollen (abdominal distention).   You are nauseated or vomiting.   You  have a temperature over 101F.   You have abdominal pain or discomfort that is severe or gets worse throughout the day.   Gastritis  Gastritis is an inflammation (the body's way of reacting to injury and/or infection) of the stomach. It can also be caused BY ASPIRIN, BC/GOODY POWDER'S, (IBUPROFEN) MOTRIN, OR ALEVE (NAPROXEN), chemicals (including alcohol), SPICY FOODS, and medications. This illness may be associated with generalized malaise (feeling tired, not well), UPPER ABDOMINAL STOMACH cramps, and fever. One common bacterial cause of gastritis is an organism known as H. Pylori. This can be treated with  antibiotics.     REFLUX   TREATMENT There are a number of medicines used to treat reflux including: Antacids.  TAGAMET OR PEPCID Proton-pump inhibitors: PRILOSEC OR NEXIUM  Lifestyle and home remedies TO CONTROL HEARTBURN You may eliminate or reduce the frequency of heartburn by making the following lifestyle changes:   Control your weight. Being overweight is a major risk factor for heartburn and GERD. Excess pounds put pressure on your abdomen, pushing up your stomach and causing acid to back up into your esophagus.    Eat smaller meals. 4 TO 6 MEALS A DAY. This reduces pressure on the lower esophageal sphincter, helping to prevent the valve from opening and acid from washing back into your esophagus.    Loosen your belt. Clothes that fit tightly around your waist put pressure on your abdomen and the lower esophageal sphincter.    Eliminate heartburn triggers. Everyone has specific triggers. Common triggers such as fatty or fried foods, spicy food, tomato sauce, carbonated beverages, alcohol, chocolate, mint, garlic, onion, caffeine and nicotine may make heartburn worse.    Avoid stooping or bending. Tying your shoes is OK. Bending over for longer periods to weed your garden isn't, especially soon after eating.    Don't lie down after a meal. Wait at least three to four hours after eating before going to bed, and don't lie down right after eating.   Alternative medicine  Several home remedies exist for treating GERD, but they provide only temporary relief. They include drinking baking soda (sodium bicarbonate) added to water or drinking other fluids such as baking soda mixed with cream of tartar and water.  Although these liquids create temporary relief by neutralizing, washing away or buffering acids, eventually they aggravate the situation by adding gas and fluid to your stomach, increasing pressure and causing more acid reflux. Further, adding more sodium to your diet may  increase your blood pressure and add stress to your heart, and excessive bicarbonate ingestion can alter the acid-base balance in your body.

## 2019-03-12 NOTE — H&P (Signed)
Primary Care Physician:  Sinda Du, MD Primary Gastroenterologist:  Dr. Oneida Alar  Pre-Procedure History & Physical: HPI:  [name] is a 63 y.o. female here for DYSPEPSIA.  Past Medical History:  Diagnosis Date  . Abnormal Pap smear of cervix   . Anxiety   . Asthma   . Depression   . Essential hypertension   . Fibromyalgia   . GERD (gastroesophageal reflux disease)   . History of kidney stones   . History of stroke   . PVC's (premature ventricular contractions)   . Sciatica of right side     Past Surgical History:  Procedure Laterality Date  . ADENOIDECTOMY    . APPENDECTOMY    . BUNIONECTOMY Bilateral   . CHONDROPLASTY Right 06/19/2018   Procedure: KNEE ARTHROSCOPY WITH chondroplasty;  Surgeon: Carole Civil, MD;  Location: [facility];  Service: Orthopedics;  Laterality: Right;  . COLONOSCOPY  10/08/2011   diverticulosis, hyperplastic polyp removed. next tcs 2023.  Marland Kitchen FOOT SURGERY  08/2018  . KNEE SURGERY  05/2018  . TONSILLECTOMY    . TUBAL LIGATION      Prior to Admission medications   Medication Sig Start Date End Date Taking? Authorizing Provider  ALPRAZolam Duanne Moron) 1 MG tablet Take 1 mg by mouth daily as needed for anxiety.  08/17/17  Yes [provider]  amLODipine (NORVASC) 5 MG tablet Take 5 mg by mouth daily. 07/04/17  Yes [provider]  budesonide-formoterol (SYMBICORT) 160-4.5 MCG/ACT inhaler Inhale 2 puffs into the lungs 2 (two) times daily.   Yes [provider]  dexlansoprazole (DEXILANT) 60 MG capsule Take 1 capsule (60 mg total) by mouth daily before breakfast. 12/08/18  Yes Mahala Menghini, PA-C  diphenhydrAMINE (BENADRYL) 25 mg capsule Take 50 mg by mouth daily as needed for itching or allergies.    Yes [provider]  ibuprofen (ADVIL) 200 MG tablet Take 600 mg by mouth every 6 (six) hours as needed for headache or moderate pain.   Yes [provider]  lubiprostone (AMITIZA) 24 MCG capsule  Take 24 mcg by mouth at bedtime.    Yes [provider]  PARoxetine (PAXIL) 10 MG tablet Take 10 mg by mouth daily.   Yes [provider]  PROAIR HFA 108 (90 Base) MCG/ACT inhaler Inhale 2 puffs into the lungs every 6 (six) hours as needed for wheezing or shortness of breath.    Yes [provider]  topiramate (TOPAMAX) 200 MG tablet Take 200 mg by mouth 2 (two) times daily.    Yes [provider]  traMADol (ULTRAM) 50 MG tablet Take 150 mg by mouth at bedtime.    Yes [provider]  EPINEPHrine (EPIPEN 2-PAK) 0.3 mg/0.3 mL IJ SOAJ injection Inject 0.3 mg into the muscle as needed for anaphylaxis.    [provider]  phentermine (ADIPEX-P) 37.5 MG tablet TAKE ONE-HALF TO ONE TABLET EVERY 3 DAYSFOR FATIGUE Patient not taking: Reported on 03/06/2019 01/05/19   [name], MD    Allergies as of 01/19/2019 - Review Complete 12/08/2018  Allergen Reaction Noted  . Lidocaine Rash and Other (See Comments) 01/08/2018  . Other Other (See Comments) 12/19/2017    Family History  Problem Relation Age of Onset  . Hypertension Mother   . Diverticulosis Mother   . Hypertension Father   . AAA (abdominal aortic aneurysm) Father   . Cancer Brother        not sure where primary, was metastatic. was jaundice.   Marland Kitchen  COPD Brother   . Angioedema Neg Hx   . Atopy Neg Hx   . Eczema Neg Hx   . Immunodeficiency Neg Hx   . Urticaria Neg Hx   . Asthma Neg Hx   . Allergic rhinitis Neg Hx   . Colon cancer Neg Hx     Social History   Socioeconomic History  . Marital status: Married    Spouse name: Not on file  . Number of children: Not on file  . Years of education: Not on file  . Highest education level: Not on file  Occupational History  . Not on file  Social Needs  . Financial resource strain: Not on file  . Food insecurity    Worry: Not on file    Inability: Not on file  . Transportation needs    Medical: Not on file    Non-medical: Not  on file  Tobacco Use  . Smoking status: Former Smoker    Packs/day: 0.25    Years: 1.00    Pack years: 0.25    Types: Cigarettes    Quit date: 06/17/1975    Years since quitting: 43.7  . Smokeless tobacco: Never Used  Substance and Sexual Activity  . Alcohol use: No  . Drug use: No  . Sexual activity: Yes    Birth control/protection: Surgical, Post-menopausal    Comment: tubal  Lifestyle  . Physical activity    Days per week: Not on file    Minutes per session: Not on file  . Stress: Not on file  Relationships  . Social Herbalist on phone: Not on file    Gets together: Not on file    Attends religious service: Not on file    Active member of club or organization: Not on file    Attends meetings of clubs or organizations: Not on file    Relationship status: Not on file  . Intimate partner violence    Fear of current or ex partner: Not on file    Emotionally abused: Not on file    Physically abused: Not on file    Forced sexual activity: Not on file  Other Topics Concern  . Not on file  Social History Narrative  . Not on file    Review of Systems: See HPI, otherwise negative ROS   Physical Exam: BP (!) 143/82   Pulse 68   Temp 98.1 F (36.7 C) (Oral)   Resp 16   Ht 5\' 8"  (1.727 m)   Wt 81.6 kg   LMP 12/13/2015   SpO2 100%   BMI 27.37 kg/m  General:   Alert,  pleasant and cooperative in NAD Head:  Normocephalic and atraumatic. Neck:  Supple; Lungs:  Clear throughout to auscultation.    Heart:  Regular rate and rhythm. Abdomen:  Soft, nontender and nondistended. Normal bowel sounds, without guarding, and without rebound.   Neurologic:  Alert and  oriented x4;  grossly normal neurologically.  Impression/Plan:     DYSPEPSIA  PLAN:  EGD TODAY.  DISCUSSED PROCEDURE, BENEFITS, & RISKS: < 1% chance of medication reaction, bleeding, perforation, or ASPIRATION.

## 2019-03-12 NOTE — Op Note (Signed)
[facility] Patient Name: [name] Procedure Date: 03/12/2019 8:24 AM MRN: [id number] Date of Birth: [birth year] Attending MD: Barney Drain MD, MD CSN: [id number] Age: 63 Admit Type: Outpatient Procedure:                Upper GI endoscopy-BALOON DILATION/COLD FORCEPS                            BIOPSY Indications:              Dyspepsia-LAST CT 2019: NAIAP Providers:                Barney Drain MD, MD, Charlsie Quest. Theda Sers RN, RN,                            Nelma Rothman, Technician Referring MD:             Jasper Loser. Luan Pulling MD, MD Medicines:                Meperidine 100 mg IV, Midazolam 9 mg IV Complications:            No immediate complications. Estimated Blood Loss:     Estimated blood loss was minimal. Procedure:                Pre-Anesthesia Assessment:                           - Prior to the procedure, a History and Physical                            was performed, and patient medications and                            allergies were reviewed. The patient's tolerance of                            previous anesthesia was also reviewed. The risks                            and benefits of the procedure and the sedation                            options and risks were discussed with the patient.                            All questions were answered, and informed consent                            was obtained. Prior Anticoagulants: The patient has                            taken no previous anticoagulant or antiplatelet                            agents. ASA Grade Assessment: II - A patient with  mild systemic disease. After reviewing the risks                            and benefits, the patient was deemed in                            satisfactory condition to undergo the procedure.                            After obtaining informed consent, the endoscope was                            passed under direct vision. Throughout the                 procedure, the patient's blood pressure, pulse, and                            oxygen saturations were monitored continuously. The                            GIF-H190 ([id number]) scope was introduced through the                            mouth, and advanced to the second part of duodenum.                            The upper GI endoscopy was somewhat difficult due                            to presence of bezoar. The patient tolerated the                            procedure well. Scope In: 8:59:50 AM Scope Out: 9:09:39 AM Total Procedure Duration: 0 hours 9 minutes 49 seconds  Findings:      A non-obstructing Schatzki ring was found at the gastroesophageal       junction.      A medium-sized hiatal hernia was present.      Suspect gastroparesis due to retained gastric contents. A TTS dilator       was passed through the scope. Dilation with a 13.5 mm and a 15 mm       pyloric balloon dilator was performed. The dilation site was examined       following endoscope reinsertion and showed moderate improvement in       luminal narrowing. Estimated blood loss: none.      Patchy mild inflammation characterized by congestion (edema) and       erythema was found in the gastric antrum. Biopsies were taken with a       cold forceps for Helicobacter pylori testing.      A single 6 mm sessile polyp with no bleeding and no stigmata of recent       bleeding was found in the gastric body. This was biopsied with a cold       forceps for histology.      The examined duodenum was normal. Impression:               -  Non-obstructing Schatzki ring.                           - Medium-sized hiatal hernia.                           - UNCONTROLLED REFLUX DUE TO POSSIBLE                            Gastroparesis, idiopathic etiology. Dilated.                           - MILD Gastritis. Biopsied.                           - A single gastric polyp. Biopsied. Moderate Sedation:      Moderate  (conscious) sedation was administered by the endoscopy nurse       and supervised by the endoscopist. The following parameters were       monitored: oxygen saturation, heart rate, blood pressure, and response       to care. Total physician intraservice time was 24 minutes. Recommendation:           - Patient has a contact number available for                            emergencies. The signs and symptoms of potential                            delayed complications were discussed with the                            patient. Return to normal activities tomorrow.                            Written discharge instructions were provided to the                            patient.                           - Low fat diet. CONITNUE WEIGHT LOSS. FOLLOW                            RECOMEMNDATION OF DR. MARK HYMAN, "10 DAY DETOX                            DIET".                           - Continue present medications.                           - Await pathology results. COMPLETE GES. IF PRESENT                            PT WILL NOT BE A CANDIDATE FOR NISSEN  FUNDOPLICATION.                           - Return to GI office in 6 months. IF SYMPTOMS                            PERSIST, PT WILL NEED A REPEAT EGD FOR COMPLET EXAM                            OF THE GASTRIC MUCOSA. Procedure Code(s):        --- Professional ---                           [phone], Esophagogastroduodenoscopy, flexible,                            transoral; with dilation of gastric/duodenal                            stricture(s) (eg, balloon, bougie)                           43239, 59, Esophagogastroduodenoscopy, flexible,                            transoral; with biopsy, single or multiple                           99153, Moderate sedation; each additional 15                            minutes intraservice time                           G0500, Moderate sedation services provided by the                             same physician or other qualified health care                            professional performing a gastrointestinal                            endoscopic service that sedation supports,                            requiring the presence of an independent trained                            observer to assist in the monitoring of the                            patient's level of consciousness and physiological                            status; initial 15 minutes of intra-service  time;                            patient age 2 years or older (additional time may                            be reported with [phone], as appropriate) Diagnosis Code(s):        --- Professional ---                           K22.2, Esophageal obstruction                           K44.9, Diaphragmatic hernia without obstruction or                            gangrene                           K31.84, Gastroparesis                           K29.70, Gastritis, unspecified, without bleeding                           K31.7, Polyp of stomach and duodenum                           R10.13, Epigastric pain CPT copyright 2019 American Medical Association. All rights reserved. The codes documented in this report are preliminary and upon coder review may  be revised to meet current compliance requirements. Barney Drain, MD Barney Drain MD, MD 03/12/2019 9:31:51 AM This report has been signed electronically. Number of Addenda: 0

## 2019-03-16 ENCOUNTER — Other Ambulatory Visit: Payer: Self-pay | Admitting: *Deleted

## 2019-03-16 DIAGNOSIS — R112 Nausea with vomiting, unspecified: Secondary | ICD-10-CM

## 2019-03-16 NOTE — Progress Notes (Signed)
PATIENT SCHEDULED  °

## 2019-03-16 NOTE — Progress Notes (Signed)
CC'D TO PCP °

## 2019-03-19 ENCOUNTER — Encounter (HOSPITAL_COMMUNITY)
Admission: RE | Admit: 2019-03-19 | Discharge: 2019-03-19 | Disposition: A | Discharge status: Home / Self Care | Payer: PPO | Source: Ambulatory Visit | Attending: Gastroenterology | Admitting: Gastroenterology

## 2019-03-19 ENCOUNTER — Encounter (HOSPITAL_COMMUNITY): Payer: Self-pay

## 2019-03-19 ENCOUNTER — Other Ambulatory Visit: Payer: Self-pay

## 2019-03-19 DIAGNOSIS — R112 Nausea with vomiting, unspecified: Secondary | ICD-10-CM | POA: Insufficient documentation

## 2019-03-19 DIAGNOSIS — K219 Gastro-esophageal reflux disease without esophagitis: Secondary | ICD-10-CM | POA: Diagnosis not present

## 2019-03-19 MED ORDER — TECHNETIUM TC 99M SULFUR COLLOID
2.0000 | Freq: Once | INTRAVENOUS | Status: AC | PRN
  Administered 2019-03-19: 1.9 via ORAL

## 2019-03-27 DIAGNOSIS — Z23 Encounter for immunization: Secondary | ICD-10-CM | POA: Diagnosis not present

## 2019-03-27 DIAGNOSIS — I10 Essential (primary) hypertension: Secondary | ICD-10-CM | POA: Diagnosis not present

## 2019-03-27 DIAGNOSIS — M797 Fibromyalgia: Secondary | ICD-10-CM | POA: Diagnosis not present

## 2019-03-27 DIAGNOSIS — R079 Chest pain, unspecified: Secondary | ICD-10-CM | POA: Diagnosis not present

## 2019-03-27 DIAGNOSIS — F329 Major depressive disorder, single episode, unspecified: Secondary | ICD-10-CM | POA: Diagnosis not present

## 2019-03-27 DIAGNOSIS — Z0000 Encounter for general adult medical examination without abnormal findings: Secondary | ICD-10-CM | POA: Diagnosis not present

## 2019-04-07 NOTE — Progress Notes (Signed)
CC'D TO PCP °

## 2019-04-23 DIAGNOSIS — M2042 Other hammer toe(s) (acquired), left foot: Secondary | ICD-10-CM | POA: Diagnosis not present

## 2019-04-23 DIAGNOSIS — M216X2 Other acquired deformities of left foot: Secondary | ICD-10-CM | POA: Diagnosis not present

## 2019-04-23 DIAGNOSIS — M2012 Hallux valgus (acquired), left foot: Secondary | ICD-10-CM | POA: Diagnosis not present

## 2019-05-12 DIAGNOSIS — M2042 Other hammer toe(s) (acquired), left foot: Secondary | ICD-10-CM | POA: Diagnosis not present

## 2019-05-12 DIAGNOSIS — Z87891 Personal history of nicotine dependence: Secondary | ICD-10-CM | POA: Diagnosis not present

## 2019-05-12 DIAGNOSIS — H5509 Other forms of nystagmus: Secondary | ICD-10-CM | POA: Diagnosis not present

## 2019-05-12 DIAGNOSIS — M205X2 Other deformities of toe(s) (acquired), left foot: Secondary | ICD-10-CM | POA: Diagnosis not present

## 2019-05-12 DIAGNOSIS — Z79899 Other long term (current) drug therapy: Secondary | ICD-10-CM | POA: Diagnosis not present

## 2019-05-14 DIAGNOSIS — M2042 Other hammer toe(s) (acquired), left foot: Secondary | ICD-10-CM | POA: Diagnosis not present

## 2019-05-14 DIAGNOSIS — M205X2 Other deformities of toe(s) (acquired), left foot: Secondary | ICD-10-CM | POA: Diagnosis not present

## 2019-05-14 DIAGNOSIS — M2012 Hallux valgus (acquired), left foot: Secondary | ICD-10-CM | POA: Diagnosis not present

## 2019-05-14 DIAGNOSIS — M216X2 Other acquired deformities of left foot: Secondary | ICD-10-CM | POA: Diagnosis not present

## 2019-05-14 HISTORY — PX: FOOT SURGERY: SHX648

## 2019-06-02 DIAGNOSIS — M2012 Hallux valgus (acquired), left foot: Secondary | ICD-10-CM | POA: Diagnosis not present

## 2019-06-09 NOTE — Progress Notes (Addendum)
REVIEWED-NO ADDITIONAL RECOMMENDATIONS.  Referring Provider: Sinda Du, MD Primary Care Physician:  Sinda Du, MD Primary GI Physician: Dr. Oneida Alar  Chief Complaint  Patient presents with  . Gastroesophageal Reflux    hernia,follow up,hurting in between breastbone    HPI:   [name] is a 63 y.o. female presenting today for follow-up of GERD.  She was last seen via telemedicine visit on 12/08/2018 for the same.  At that time she was having refractory GERD symptoms with burning from xiphoid up to throat on Protonix 40 mg daily, Nexium 20 mg at bedtime, and intermittent baking soda use.  Denied dysphagia.  Reported sensation of lump in her throat that improved some with Mucinex and Benadryl.  Patient was offered EGD in July, antireflux measures were reinforced, and PPI therapy was changed to Dexilant 60 mg daily.  EGD on 03/12/2019.  Impression with nonobstructing Schatzki ring s/p dilation, moderate sized hiatal hernia, mild gastritis s/p biopsied, single gastric polyp s/p biopsied, possible gastroparesis suspected due to retained gastric contents.  Recommendations included completing a 10-day detox diet, continuing present medications, and complete GES.  If gastroparesis present, patient would not be candidate for nissen fundoplication.  Advised patient return in 6 months.  If symptoms persist, patient needs repeat EGD for complete exam of gastric mucosa.  Pathology was benign with no H. Pylori.  GES on 03/19/2019 normal.  Today she states she continues with burning in her esophagus from xiphoid to sternal notch. Taking Dexilant in the morning and tagamet at night. Symptoms come on after eating. Regardless of what she eats. Will take baking soda, have a large burp, and symptoms resolve. Has cut out coffee and all caffeine. Only drinking gingerale with baking soda. Otherwise water. No fried foods. Meats are baked, broiled, or grilled. Has not propped her bed up. Has woken up  with acid coming back up a few months ago. She isn't consuming as much or doing as much and hasn't had the significant night time symptoms. No nausea, vomiting, dysphagia, or upper abdominal pain. Thinks she has lost some weight since her foot surgery.   BMs daily. No constipation or diarrhea. Amitiza working well.  No blood in the stool or melena.   Taking Advil. 2-3 times a month prior to foot surgery. Now she is taking 12 Advil a day since surgery on 05/14/19. States she is in bed right now for 6 weeks.    Past Medical History:  Diagnosis Date  . Abnormal Pap smear of cervix   . Anxiety   . Asthma   . Depression   . Essential hypertension   . Fibromyalgia   . GERD (gastroesophageal reflux disease)   . History of kidney stones   . History of stroke   . PVC's (premature ventricular contractions)   . Sciatica of right side     Past Surgical History:  Procedure Laterality Date  . ADENOIDECTOMY    . APPENDECTOMY    . BUNIONECTOMY Bilateral   . CHONDROPLASTY Right 06/19/2018   Procedure: KNEE ARTHROSCOPY WITH chondroplasty;  Surgeon: Carole Civil, MD;  Location: [facility];  Service: Orthopedics;  Laterality: Right;  . COLONOSCOPY  10/08/2011   diverticulosis, hyperplastic polyp removed. next tcs 2023.  Marland Kitchen ESOPHAGOGASTRODUODENOSCOPY N/A 03/12/2019   (EGD) with pyloric dilation;  Surgeon: Danie Binder, MD; nonobstructing Schatzki ring s/p dilation, moderate sized hiatal hernia, mild gastritis s/p biopsy, single gastric polyp s/p biopsy, possible gastroparesis with retained gastric contents.  Pathology benign with no  H. pylori.  Marland Kitchen FOOT SURGERY  08/2018  . FOOT SURGERY  05/14/2019  . KNEE SURGERY  05/2018  . POLYPECTOMY  03/12/2019   Procedure: POLYPECTOMY;  Surgeon: Danie Binder, MD;  Location: [facility] SUITE;  Service: Endoscopy;;  . TONSILLECTOMY    . TUBAL LIGATION      Current Outpatient Medications  Medication Sig Dispense Refill  . ALPRAZolam (XANAX) 1 MG tablet Take 1  mg by mouth as needed for anxiety.   0  . amLODipine (NORVASC) 5 MG tablet Take 5 mg by mouth daily.  0  . budesonide-formoterol (SYMBICORT) 160-4.5 MCG/ACT inhaler Inhale 2 puffs into the lungs 2 (two) times daily.    . diphenhydrAMINE (BENADRYL) 25 mg capsule Take 50 mg by mouth as needed for itching or allergies.     Marland Kitchen EPINEPHrine (EPIPEN 2-PAK) 0.3 mg/0.3 mL IJ SOAJ injection Inject 0.3 mg into the muscle as needed for anaphylaxis.    Marland Kitchen ibuprofen (ADVIL) 200 MG tablet Take 600 mg by mouth as needed for headache or moderate pain.     Marland Kitchen lubiprostone (AMITIZA) 24 MCG capsule Take 24 mcg by mouth 2 (two) times daily.     Marland Kitchen PROAIR HFA 108 (90 Base) MCG/ACT inhaler Inhale 2 puffs into the lungs as needed for wheezing or shortness of breath.   1  . topiramate (TOPAMAX) 200 MG tablet Take 200 mg by mouth 2 (two) times daily.     . traMADol (ULTRAM) 50 MG tablet Take 150 mg by mouth at bedtime.     . lansoprazole (PREVACID) 30 MG capsule Take 1 capsule (30 mg total) by mouth 2 (two) times daily before a meal. 60 capsule 11   No current facility-administered medications for this visit.     Allergies as of 06/10/2019 - Review Complete 06/10/2019  Allergen Reaction Noted  . Lidocaine Rash and Other (See Comments) 01/08/2018  . Other Other (See Comments) 12/19/2017    Family History  Problem Relation Age of Onset  . Hypertension Mother   . Diverticulosis Mother   . Hypertension Father   . AAA (abdominal aortic aneurysm) Father   . Cancer Brother        not sure where primary, was metastatic. was jaundice.   Marland Kitchen COPD Brother   . Angioedema Neg Hx   . Atopy Neg Hx   . Eczema Neg Hx   . Immunodeficiency Neg Hx   . Urticaria Neg Hx   . Asthma Neg Hx   . Allergic rhinitis Neg Hx   . Colon cancer Neg Hx     Social History   Socioeconomic History  . Marital status: Married    Spouse name: Not on file  . Number of children: Not on file  . Years of education: Not on file  . Highest  education level: Not on file  Occupational History  . Not on file  Social Needs  . Financial resource strain: Not on file  . Food insecurity    Worry: Not on file    Inability: Not on file  . Transportation needs    Medical: Not on file    Non-medical: Not on file  Tobacco Use  . Smoking status: Former Smoker    Packs/day: 0.25    Years: 1.00    Pack years: 0.25    Types: Cigarettes    Quit date: 06/17/1975    Years since quitting: 44.0  . Smokeless tobacco: Never Used  Substance and Sexual Activity  . Alcohol  use: No  . Drug use: No  . Sexual activity: Yes    Birth control/protection: Surgical, Post-menopausal    Comment: tubal  Lifestyle  . Physical activity    Days per week: Not on file    Minutes per session: Not on file  . Stress: Not on file  Relationships  . Social Herbalist on phone: Not on file    Gets together: Not on file    Attends religious service: Not on file    Active member of club or organization: Not on file    Attends meetings of clubs or organizations: Not on file    Relationship status: Not on file  Other Topics Concern  . Not on file  Social History Narrative  . Not on file    Review of Systems: Gen: Denies fever, chills, lightheadedness, dizziness, or feeling like she will pass out.  She does have increased weakness and fatigues easily since her foot surgery as she has been bedridden. CV: Denies chest pain. Fast heart rate when getting up out of bed since her surgery.   Resp: Denies dyspnea at rest, cough GI: See HPI Derm: Denies rash Psych: Denies depression, anxiety Heme: Denies bruising, bleeding  Physical Exam: BP (!) 140/100   Pulse (!) 114   Temp (!) 96.9 F (36.1 C)   Ht 5\' 8"  (1.727 m)   LMP 12/13/2015   BMI 27.37 kg/m  General:   Alert and oriented. No distress noted. Pleasant and cooperative.  In a wheelchair. Head:  Normocephalic and atraumatic. Eyes:  Conjuctiva clear without scleral icterus.Marland Kitchen Heart:  S1,  S2 present without murmurs appreciated. Lungs:  Clear to auscultation bilaterally. No wheezes, rales, or rhonchi. No distress.  Abdomen:  +BS, soft, non-tender and non-distended. No rebound or guarding. No HSM or masses noted. Neurologic:  Alert and  oriented x4 Psych: Normal mood and affect.

## 2019-06-10 ENCOUNTER — Ambulatory Visit (INDEPENDENT_AMBULATORY_CARE_PROVIDER_SITE_OTHER): Payer: PPO | Admitting: Gastroenterology

## 2019-06-10 ENCOUNTER — Other Ambulatory Visit: Payer: Self-pay

## 2019-06-10 ENCOUNTER — Ambulatory Visit: Payer: PPO | Admitting: Gastroenterology

## 2019-06-10 ENCOUNTER — Encounter: Payer: Self-pay | Admitting: Gastroenterology

## 2019-06-10 DIAGNOSIS — K219 Gastro-esophageal reflux disease without esophagitis: Secondary | ICD-10-CM | POA: Diagnosis not present

## 2019-06-10 MED ORDER — LANSOPRAZOLE 30 MG PO CPDR: 30.0000 mg | DELAYED_RELEASE_CAPSULE | Freq: Two times a day (BID) | ORAL | 11 refills | Status: DC

## 2019-06-10 NOTE — Patient Instructions (Addendum)
1. We will get you scheduled for EGD with Dr. Oneida Alar in the near future.  We will have you on a clear liquid diet the entire day before the procedure due to retained gastric contents during last procedure.   2. Stop Dexilant and start Prevacid 30 mg twice daily. Take 30 minutes before breakfast and 30 minutes before dinner.   3. Continue avoiding spicy, fried, fatty, or greasy foods. Continue to avoid caffeine, carbonated beverages, and  Chocolate. Meats should be baked, broiled, or grilled. You should consume 4-6 small meals a day.  4. Please decrease your Advil use. This is going to cause worsening inflammation in your stomach and possibly create ulcers.   5. We will see you back after your procedure.   Aliene Altes, PA-C [facility]   Food Choices for Gastroesophageal Reflux Disease, Adult When you have gastroesophageal reflux disease (GERD), the foods you eat and your eating habits are very important. Choosing the right foods can help ease your discomfort. Think about working with a nutrition specialist (dietitian) to help you make good choices. What are tips for following this plan?  Meals  Choose healthy foods that are low in fat, such as fruits, vegetables, whole grains, low-fat dairy products, and lean meat, fish, and poultry.  Eat small meals often instead of 3 large meals a day. Eat your meals slowly, and in a place where you are relaxed. Avoid bending over or lying down until 2-3 hours after eating.  Avoid eating meals 2-3 hours before bed.  Avoid drinking a lot of liquid with meals.  Cook foods using methods other than frying. Bake, grill, or broil food instead.  Avoid or limit: ? Chocolate. ? Peppermint or spearmint. ? Alcohol. ? Pepper. ? Black and decaffeinated coffee. ? Black and decaffeinated tea. ? Bubbly (carbonated) soft drinks. ? Caffeinated energy drinks and soft drinks.  Limit high-fat foods such as: ? Fatty meat or fried  foods. ? Whole milk, cream, butter, or ice cream. ? Nuts and nut butters. ? Pastries, donuts, and sweets made with butter or shortening.  Avoid foods that cause symptoms. These foods may be different for everyone. Common foods that cause symptoms include: ? Tomatoes. ? Oranges, lemons, and limes. ? Peppers. ? Spicy food. ? Onions and garlic. ? Vinegar. Lifestyle  Maintain a healthy weight. Ask your doctor what weight is healthy for you. If you need to lose weight, work with your doctor to do so safely.  Exercise for at least 30 minutes for 5 or more days each week, or as told by your doctor.  Wear loose-fitting clothes.  Do not smoke. If you need help quitting, ask your doctor.  Sleep with the head of your bed higher than your feet. Use a wedge under the mattress or blocks under the bed frame to raise the head of the bed. Summary  When you have gastroesophageal reflux disease (GERD), food and lifestyle choices are very important in easing your symptoms.  Eat small meals often instead of 3 large meals a day. Eat your meals slowly, and in a place where you are relaxed.  Limit high-fat foods such as fatty meat or fried foods.  Avoid bending over or lying down until 2-3 hours after eating.  Avoid peppermint and spearmint, caffeine, alcohol, and chocolate. This information is not intended to replace advice given to you by your health care provider. Make sure you discuss any questions you have with your health care provider. Document Released: 02/05/2012 Document Revised: 11/27/2018  Document Reviewed: 09/11/2016 Elsevier Patient Education  [employer].

## 2019-06-14 ENCOUNTER — Encounter: Payer: Self-pay | Admitting: Gastroenterology

## 2019-06-14 NOTE — Assessment & Plan Note (Addendum)
GERD symptoms are uncontrolled.  Continues with burning in her esophagus from the xiphoid to sternal notch after meals daily. Nocturnal symptoms have improved since eating smaller meals. Protonix and Nexium were discontinued in April and patient was started on Dexilant 60 mg daily. She has cut out coffee and all caffeine, eliminated fried foods, and only eating baked, broiled, or grilled meats, but her symptoms have not improved.  She denies abdominal pain, nausea, vomiting, or dysphagia. No brbpr or melena.  She underwent foot surgery in September and has been taking 12 Advil a day.  Thinks she has lost weight as she is eating less since her foot surgery as she does not want to gain weight.  No weight today due to inability to bear weight. EGD on 03/12/2019 with nonobstructing Schatzki's ring s/p dilation, moderate-sized hiatal hernia, mild gastritis s/p biopsy, single gastric polyp s/p biopsy, possible gastroparesis due to retained gastric contents.  Pathology was benign with no H. pylori.  Recommended GES and repeat EGD for complete exam of gastric mucosa if symptoms persist.  GES normal on 03/19/2019.   Suspect ongoing GERD symptoms may be exacerbated by her significant NSAID use. Although her GES was normal, it is possible she may have some delayed gastric emptying vs hiatal hernia contributing to symptoms.   As she as been on Dexilant since April and doesn't feel she improved between April and [address] (before her surgery and start of significant NSAID use), I will change Dexilant to Prevacid 30 mg BID.  Explained the importance of avoiding Advil. Advised she reach out to her surgeon regarding pain management.  GERD diet. Handout provided.  Consume 4-6 small meals daily.  Proceed with repeat EGD with Dr. Oneida Alar as recommended due to persistent symptoms to allow for complete exam of gastric mucosa. Propofol due to increased dosage of moderate sedation required during last EGD.  Clear liquid diet the day  before procedure due to retained gastric contents during last exam.  Follow-up after procedure.

## 2019-06-23 DIAGNOSIS — M2042 Other hammer toe(s) (acquired), left foot: Secondary | ICD-10-CM | POA: Diagnosis not present

## 2019-06-23 DIAGNOSIS — M2012 Hallux valgus (acquired), left foot: Secondary | ICD-10-CM | POA: Diagnosis not present

## 2019-06-26 DIAGNOSIS — E119 Type 2 diabetes mellitus without complications: Secondary | ICD-10-CM | POA: Diagnosis not present

## 2019-06-26 DIAGNOSIS — I10 Essential (primary) hypertension: Secondary | ICD-10-CM | POA: Diagnosis not present

## 2019-06-26 DIAGNOSIS — J301 Allergic rhinitis due to pollen: Secondary | ICD-10-CM | POA: Diagnosis not present

## 2019-06-26 DIAGNOSIS — J441 Chronic obstructive pulmonary disease with (acute) exacerbation: Secondary | ICD-10-CM | POA: Diagnosis not present

## 2019-07-07 ENCOUNTER — Other Ambulatory Visit: Payer: Self-pay | Admitting: *Deleted

## 2019-07-07 DIAGNOSIS — I10 Essential (primary) hypertension: Secondary | ICD-10-CM | POA: Diagnosis not present

## 2019-07-07 DIAGNOSIS — J45901 Unspecified asthma with (acute) exacerbation: Secondary | ICD-10-CM | POA: Diagnosis not present

## 2019-07-07 DIAGNOSIS — F419 Anxiety disorder, unspecified: Secondary | ICD-10-CM | POA: Diagnosis not present

## 2019-07-08 DIAGNOSIS — J45901 Unspecified asthma with (acute) exacerbation: Secondary | ICD-10-CM | POA: Diagnosis not present

## 2019-07-09 DIAGNOSIS — J45901 Unspecified asthma with (acute) exacerbation: Secondary | ICD-10-CM | POA: Diagnosis not present

## 2019-07-09 DIAGNOSIS — M79672 Pain in left foot: Secondary | ICD-10-CM | POA: Diagnosis not present

## 2019-07-09 DIAGNOSIS — M2042 Other hammer toe(s) (acquired), left foot: Secondary | ICD-10-CM | POA: Diagnosis not present

## 2019-07-10 DIAGNOSIS — J45901 Unspecified asthma with (acute) exacerbation: Secondary | ICD-10-CM | POA: Diagnosis not present

## 2019-07-13 DIAGNOSIS — J45901 Unspecified asthma with (acute) exacerbation: Secondary | ICD-10-CM | POA: Diagnosis not present

## 2019-07-14 ENCOUNTER — Encounter: Payer: Self-pay | Admitting: *Deleted

## 2019-07-14 ENCOUNTER — Telehealth: Payer: Self-pay | Admitting: *Deleted

## 2019-07-14 DIAGNOSIS — J45901 Unspecified asthma with (acute) exacerbation: Secondary | ICD-10-CM | POA: Diagnosis not present

## 2019-07-14 NOTE — Telephone Encounter (Signed)
Called pt and informed her that her Covid screening needed to be changed to 08/22/2019.  Pt informed that I will mail her a letter with the new appointment information.  Pt is aware of new date and time.  Pt voiced understanding.

## 2019-07-15 DIAGNOSIS — J45901 Unspecified asthma with (acute) exacerbation: Secondary | ICD-10-CM | POA: Diagnosis not present

## 2019-07-15 DIAGNOSIS — R079 Chest pain, unspecified: Secondary | ICD-10-CM | POA: Diagnosis not present

## 2019-07-15 DIAGNOSIS — I10 Essential (primary) hypertension: Secondary | ICD-10-CM | POA: Diagnosis not present

## 2019-07-20 DIAGNOSIS — J45901 Unspecified asthma with (acute) exacerbation: Secondary | ICD-10-CM | POA: Diagnosis not present

## 2019-08-18 DIAGNOSIS — M2042 Other hammer toe(s) (acquired), left foot: Secondary | ICD-10-CM | POA: Diagnosis not present

## 2019-08-18 NOTE — Patient Instructions (Signed)
39    Your procedure is scheduled on: 08/24/2019  Report to [facility] at   9:00  AM.  Call this number if you have problems the morning of surgery: [phone]   Remember:   Do not drink or eat food:After Midnight.      Take these medicines the morning of surgery with A SIP OF WATER: Amlodipine, Dexilant and symbicort inhaler.  Xanax, tramadol and robaxin if needed   Do not wear jewelry, make-up or nail polish.  Do not wear lotions, powders, or perfumes. You may wear deodorant.                Do not bring valuables to the hospital.  Contacts, dentures or bridgework may not be worn into surgery.  Leave suitcase in the car. After surgery it may be brought to your room.  For patients admitted to the hospital, checkout time is 11:00 AM the day of discharge.   Patients discharged the day of surgery will not be allowed to drive home.                                                                                                                                    EndoscopyCare After Please read the instructions outlined below and refer to this sheet in the next few weeks. These discharge instructions provide you with general information on caring for yourself after you leave the hospital. Your doctor may also give you specific instructions. While your treatment has been planned according to the most current medical practices available, unavoidable complications occasionally occur. If you have any problems or questions after discharge, please call your doctor. HOME CARE INSTRUCTIONS Activity  You may resume your regular activity but move at a slower pace for the next 24 hours.   Take frequent rest periods for the next 24 hours.   Walking will help expel (get rid of) the air and reduce the bloated feeling in your abdomen.   No driving for 24 hours (because of the anesthesia (medicine) used during the test).   You may shower.   Do not sign any important legal documents or operate any  machinery for 24 hours (because of the anesthesia used during the test).  Nutrition  Drink plenty of fluids.   You may resume your normal diet.   Begin with a light meal and progress to your normal diet.   Avoid alcoholic beverages for 24 hours or as instructed by your caregiver.  Medications You may resume your normal medications unless your caregiver tells you otherwise. What you can expect today  You may experience abdominal discomfort such as a feeling of fullness or "gas" pains.   You may experience a sore throat for 2 to 3 days. This is normal. Gargling with salt water may help this.  Follow-up Your doctor will discuss the results of your test with you. SEEK IMMEDIATE MEDICAL CARE IF:  You have excessive  nausea (feeling sick to your stomach) and/or vomiting.   You have severe abdominal pain and distention (swelling).   You have trouble swallowing.   You have a temperature over 100 F (37.8 C).   You have rectal bleeding or vomiting of blood.  Document Released: 03/20/2004 Document Revised: 07/26/2011 Document Reviewed: 10/01/2007

## 2019-08-20 ENCOUNTER — Encounter (HOSPITAL_COMMUNITY)
Admission: RE | Admit: 2019-08-20 | Discharge: 2019-08-20 | Disposition: A | Discharge status: Home / Self Care | Payer: PPO | Source: Ambulatory Visit | Attending: Gastroenterology | Admitting: Gastroenterology

## 2019-08-20 ENCOUNTER — Other Ambulatory Visit (HOSPITAL_COMMUNITY): Payer: PPO

## 2019-08-20 ENCOUNTER — Other Ambulatory Visit: Payer: Self-pay

## 2019-08-20 DIAGNOSIS — K449 Diaphragmatic hernia without obstruction or gangrene: Secondary | ICD-10-CM | POA: Insufficient documentation

## 2019-08-20 DIAGNOSIS — K2100 Gastro-esophageal reflux disease with esophagitis, without bleeding: Secondary | ICD-10-CM | POA: Diagnosis not present

## 2019-08-20 DIAGNOSIS — K222 Esophageal obstruction: Secondary | ICD-10-CM | POA: Insufficient documentation

## 2019-08-20 DIAGNOSIS — Z01812 Encounter for preprocedural laboratory examination: Secondary | ICD-10-CM | POA: Insufficient documentation

## 2019-08-20 LAB — BASIC METABOLIC PANEL
Anion gap: 10 (ref 5–15)
BUN: 9 mg/dL (ref 8–23)
CO2: 22 mmol/L (ref 22–32)
Calcium: 9 mg/dL (ref 8.9–10.3)
Chloride: 109 mmol/L (ref 98–111)
Creatinine, Ser: 1.07 mg/dL — ABNORMAL HIGH (ref 0.44–1.00)
GFR calc Af Amer: >60 mL/min (ref >60)
GFR calc non Af Amer: 56 mL/min — ABNORMAL LOW (ref >60)
Glucose, Bld: 107 mg/dL — ABNORMAL HIGH (ref 70–99)
Potassium: 4.4 mmol/L (ref 3.5–5.1)
Sodium: 141 mmol/L (ref 135–145)

## 2019-08-22 ENCOUNTER — Other Ambulatory Visit (HOSPITAL_COMMUNITY)
Admission: RE | Admit: 2019-08-22 | Discharge: 2019-08-22 | Disposition: A | Discharge status: Home / Self Care | Payer: PPO | Source: Ambulatory Visit | Attending: Gastroenterology | Admitting: Gastroenterology

## 2019-08-22 DIAGNOSIS — Z01812 Encounter for preprocedural laboratory examination: Secondary | ICD-10-CM | POA: Insufficient documentation

## 2019-08-22 DIAGNOSIS — Z20822 Contact with and (suspected) exposure to covid-19: Secondary | ICD-10-CM | POA: Diagnosis not present

## 2019-08-22 LAB — SARS CORONAVIRUS 2 (TAT 6-24 HRS): SARS Coronavirus 2: NEGATIVE

## 2019-08-24 ENCOUNTER — Ambulatory Visit (HOSPITAL_COMMUNITY): Payer: PPO | Admitting: Anesthesiology

## 2019-08-24 ENCOUNTER — Ambulatory Visit (HOSPITAL_COMMUNITY)
Admission: RE | Admit: 2019-08-24 | Discharge: 2019-08-24 | Disposition: A | Discharge status: Home / Self Care | Payer: PPO | Attending: Gastroenterology | Admitting: Gastroenterology

## 2019-08-24 ENCOUNTER — Encounter (HOSPITAL_COMMUNITY): Payer: Self-pay | Admitting: Gastroenterology

## 2019-08-24 ENCOUNTER — Other Ambulatory Visit: Payer: Self-pay

## 2019-08-24 ENCOUNTER — Encounter (HOSPITAL_COMMUNITY)
Admission: RE | Disposition: A | Discharge status: Home / Self Care | Payer: Self-pay | Source: Home / Self Care | Attending: Gastroenterology

## 2019-08-24 DIAGNOSIS — K219 Gastro-esophageal reflux disease without esophagitis: Secondary | ICD-10-CM | POA: Diagnosis not present

## 2019-08-24 DIAGNOSIS — I10 Essential (primary) hypertension: Secondary | ICD-10-CM | POA: Diagnosis not present

## 2019-08-24 DIAGNOSIS — H5461 Unqualified visual loss, right eye, normal vision left eye: Secondary | ICD-10-CM | POA: Insufficient documentation

## 2019-08-24 DIAGNOSIS — K222 Esophageal obstruction: Secondary | ICD-10-CM | POA: Diagnosis not present

## 2019-08-24 DIAGNOSIS — Z79899 Other long term (current) drug therapy: Secondary | ICD-10-CM | POA: Diagnosis not present

## 2019-08-24 DIAGNOSIS — R1310 Dysphagia, unspecified: Secondary | ICD-10-CM | POA: Diagnosis not present

## 2019-08-24 DIAGNOSIS — F419 Anxiety disorder, unspecified: Secondary | ICD-10-CM | POA: Insufficient documentation

## 2019-08-24 DIAGNOSIS — Z87891 Personal history of nicotine dependence: Secondary | ICD-10-CM | POA: Diagnosis not present

## 2019-08-24 DIAGNOSIS — I69398 Other sequelae of cerebral infarction: Secondary | ICD-10-CM | POA: Insufficient documentation

## 2019-08-24 DIAGNOSIS — Z7951 Long term (current) use of inhaled steroids: Secondary | ICD-10-CM | POA: Diagnosis not present

## 2019-08-24 DIAGNOSIS — J45909 Unspecified asthma, uncomplicated: Secondary | ICD-10-CM | POA: Diagnosis not present

## 2019-08-24 DIAGNOSIS — K2100 Gastro-esophageal reflux disease with esophagitis, without bleeding: Secondary | ICD-10-CM | POA: Insufficient documentation

## 2019-08-24 DIAGNOSIS — F329 Major depressive disorder, single episode, unspecified: Secondary | ICD-10-CM | POA: Insufficient documentation

## 2019-08-24 DIAGNOSIS — F458 Other somatoform disorders: Secondary | ICD-10-CM | POA: Diagnosis not present

## 2019-08-24 DIAGNOSIS — R1013 Epigastric pain: Secondary | ICD-10-CM | POA: Diagnosis present

## 2019-08-24 HISTORY — PX: ESOPHAGOGASTRODUODENOSCOPY (EGD) WITH PROPOFOL: SHX5813

## 2019-08-24 SURGERY — ESOPHAGOGASTRODUODENOSCOPY (EGD) WITH PROPOFOL
Anesthesia: General

## 2019-08-24 MED ORDER — CHLORHEXIDINE GLUCONATE CLOTH 2 % EX PADS: 6.0000 | MEDICATED_PAD | Freq: Once | CUTANEOUS | Status: DC

## 2019-08-24 MED ORDER — PROPOFOL 500 MG/50ML IV EMUL
INTRAVENOUS | Status: DC | PRN
  Administered 2019-08-24: 125 ug/kg/min via INTRAVENOUS

## 2019-08-24 MED ORDER — LACTATED RINGERS IV SOLN: INTRAVENOUS | Status: DC

## 2019-08-24 MED ORDER — MIDAZOLAM HCL 2 MG/2ML IJ SOLN: 0.5000 mg | Freq: Once | INTRAMUSCULAR | Status: DC | PRN

## 2019-08-24 MED ORDER — PROMETHAZINE HCL 25 MG/ML IJ SOLN: 6.2500 mg | INTRAMUSCULAR | Status: DC | PRN

## 2019-08-24 MED ORDER — GLYCOPYRROLATE 0.2 MG/ML IJ SOLN
INTRAMUSCULAR | Status: DC | PRN
  Administered 2019-08-24: .1 mg via INTRAVENOUS

## 2019-08-24 MED ORDER — PROPOFOL 10 MG/ML IV BOLUS
INTRAVENOUS | Status: AC
  Filled 2019-08-24: qty 20

## 2019-08-24 MED ORDER — LIDOCAINE 2% (20 MG/ML) 5 ML SYRINGE
INTRAMUSCULAR | Status: AC
  Filled 2019-08-24: qty 5

## 2019-08-24 MED ORDER — KETAMINE HCL 50 MG/5ML IJ SOSY
PREFILLED_SYRINGE | INTRAMUSCULAR | Status: AC
  Filled 2019-08-24: qty 5

## 2019-08-24 MED ORDER — HYDROCODONE-ACETAMINOPHEN 7.5-325 MG PO TABS: 1.0000 | ORAL_TABLET | Freq: Once | ORAL | Status: DC | PRN

## 2019-08-24 MED ORDER — KETAMINE HCL 10 MG/ML IJ SOLN
INTRAMUSCULAR | Status: DC | PRN
  Administered 2019-08-24 (×2): 30 mg via INTRAVENOUS

## 2019-08-24 MED ORDER — HYDROMORPHONE HCL 1 MG/ML IJ SOLN: 0.2500 mg | INTRAMUSCULAR | Status: DC | PRN

## 2019-08-24 MED ORDER — GLYCOPYRROLATE PF 0.2 MG/ML IJ SOSY
PREFILLED_SYRINGE | INTRAMUSCULAR | Status: AC
  Filled 2019-08-24: qty 1

## 2019-08-24 MED ORDER — LACTATED RINGERS IV SOLN: INTRAVENOUS | Status: DC | PRN

## 2019-08-24 MED ORDER — PROPOFOL 10 MG/ML IV BOLUS
INTRAVENOUS | Status: DC | PRN
  Administered 2019-08-24: 30 mg via INTRAVENOUS

## 2019-08-24 NOTE — H&P (Signed)
Primary Care Physician:  Asencion Noble, MD Primary Gastroenterologist:  Dr. Oneida Alar  Pre-Procedure History & Physical: HPI:  [name] is a 64 y.o. female here for DYSPEPSIA-last EGD incomplete JUL 2020.  Past Medical History:  Diagnosis Date  . Abnormal Pap smear of cervix   . Anxiety   . Asthma   . Depression   . Essential hypertension   . Fibromyalgia   . GERD (gastroesophageal reflux disease)   . History of kidney stones   . History of stroke   . PVC's (premature ventricular contractions)   . Sciatica of right side     Past Surgical History:  Procedure Laterality Date  . ADENOIDECTOMY    . APPENDECTOMY    . BUNIONECTOMY Bilateral   . CHONDROPLASTY Right 06/19/2018   Procedure: KNEE ARTHROSCOPY WITH chondroplasty;  Surgeon: Carole Civil, MD;  Location: [facility];  Service: Orthopedics;  Laterality: Right;  . COLONOSCOPY  10/08/2011   diverticulosis, hyperplastic polyp removed. next tcs 2023.  Marland Kitchen ESOPHAGOGASTRODUODENOSCOPY N/A 03/12/2019   (EGD) with pyloric dilation;  Surgeon: Danie Binder, MD; nonobstructing Schatzki ring s/p dilation, moderate sized hiatal hernia, mild gastritis s/p biopsy, single gastric polyp s/p biopsy, possible gastroparesis with retained gastric contents.  Pathology benign with no H. pylori.  Marland Kitchen FOOT SURGERY  08/2018  . FOOT SURGERY  05/14/2019  . KNEE SURGERY  05/2018  . POLYPECTOMY  03/12/2019   Procedure: POLYPECTOMY;  Surgeon: Danie Binder, MD;  Location: [facility];  Service: Endoscopy;;  . TONSILLECTOMY    . TUBAL LIGATION      Prior to Admission medications   Medication Sig Start Date End Date Taking? Authorizing Provider  ALPRAZolam Duanne Moron) 1 MG tablet Take 1 mg by mouth 4 (four) times daily as needed for anxiety.  08/17/17  Yes [provider]  amLODipine (NORVASC) 5 MG tablet Take 5 mg by mouth daily. 07/04/17  Yes [provider]  budesonide-formoterol (SYMBICORT) 160-4.5 MCG/ACT inhaler Inhale 2  puffs into the lungs 2 (two) times daily.   Yes [provider]  dexlansoprazole (DEXILANT) 60 MG capsule Take 60 mg by mouth 2 (two) times daily.   Yes [provider]  diphenhydrAMINE (BENADRYL) 25 mg capsule Take 25 mg by mouth every 8 (eight) hours as needed for itching or allergies.    Yes [provider]  EPINEPHrine (EPIPEN 2-PAK) 0.3 mg/0.3 mL IJ SOAJ injection Inject 0.3 mg into the muscle as needed for anaphylaxis.   Yes [provider]  furosemide (LASIX) 40 MG tablet Take 40 mg by mouth daily as needed for fluid.   Yes [provider]  guaiFENesin (MUCINEX) 600 MG 12 hr tablet Take 600 mg by mouth 2 (two) times daily.   Yes [provider]  lubiprostone (AMITIZA) 24 MCG capsule Take 24 mcg by mouth 2 (two) times daily.    Yes [provider]  methocarbamol (ROBAXIN) 500 MG tablet Take 500 mg by mouth 2 (two) times daily. 07/06/19  Yes [provider]  PARoxetine (PAXIL) 10 MG tablet Take 10 mg by mouth at bedtime. 06/24/19  Yes [provider]  PROAIR HFA 108 (90 Base) MCG/ACT inhaler Inhale 2 puffs into the lungs as needed for wheezing or shortness of breath.    Yes [provider]  topiramate (TOPAMAX) 200 MG tablet Take 200 mg by mouth 2 (two) times daily.    Yes [provider]  traMADol (ULTRAM) 50 MG tablet Take 150 mg by mouth  at bedtime.    Yes [provider]  lansoprazole (PREVACID) 30 MG capsule Take 1 capsule (30 mg total) by mouth 2 (two) times daily before a meal. Patient not taking: Reported on 08/04/2019 06/10/19   Erenest Rasher, PA-C    Allergies as of 06/10/2019 - Review Complete 06/10/2019  Allergen Reaction Noted  . Lidocaine Rash and Other (See Comments) 01/08/2018  . Other Other (See Comments) 12/19/2017    Family History  Problem Relation Age of Onset  . Hypertension Mother   . Diverticulosis Mother   . Hypertension Father   . AAA (abdominal  aortic aneurysm) Father   . Cancer Brother        not sure where primary, was metastatic. was jaundice.   Marland Kitchen COPD Brother   . Angioedema Neg Hx   . Atopy Neg Hx   . Eczema Neg Hx   . Immunodeficiency Neg Hx   . Urticaria Neg Hx   . Asthma Neg Hx   . Allergic rhinitis Neg Hx   . Colon cancer Neg Hx     Social History   Socioeconomic History  . Marital status: Married    Spouse name: Not on file  . Number of children: Not on file  . Years of education: Not on file  . Highest education level: Not on file  Occupational History  . Not on file  Tobacco Use  . Smoking status: Former Smoker    Packs/day: 0.25    Years: 1.00    Pack years: 0.25    Types: Cigarettes    Quit date: 06/17/1975    Years since quitting: 44.2  . Smokeless tobacco: Never Used  Substance and Sexual Activity  . Alcohol use: No  . Drug use: No  . Sexual activity: Yes    Birth control/protection: Surgical, Post-menopausal    Comment: tubal  Other Topics Concern  . Not on file  Social History Narrative  . Not on file   Social Determinants of Health   Financial Resource Strain:   . Difficulty of Paying Living Expenses: Not on file  Food Insecurity:   . Worried About Charity fundraiser in the Last Year: Not on file  . Ran Out of Food in the Last Year: Not on file  Transportation Needs:   . Lack of Transportation (Medical): Not on file  . Lack of Transportation (Non-Medical): Not on file  Physical Activity:   . Days of Exercise per Week: Not on file  . Minutes of Exercise per Session: Not on file  Stress:   . Feeling of Stress : Not on file  Social Connections:   . Frequency of Communication with Friends and Family: Not on file  . Frequency of Social Gatherings with Friends and Family: Not on file  . Attends Religious Services: Not on file  . Active Member of Clubs or Organizations: Not on file  . Attends Archivist Meetings: Not on file  . Marital Status: Not on file  Intimate  Partner Violence:   . Fear of Current or Ex-Partner: Not on file  . Emotionally Abused: Not on file  . Physically Abused: Not on file  . Sexually Abused: Not on file    Review of Systems: See HPI, otherwise negative ROS   Physical Exam: BP 132/86   Pulse 77   Temp 98.2 F (36.8 C) (Oral)   Resp 18   LMP 12/13/2015   SpO2 96%  General:   Alert,  pleasant and  cooperative in NAD Head:  Normocephalic and atraumatic. Neck:  Supple; Lungs:  Clear throughout to auscultation.    Heart:  Regular rate and rhythm. Abdomen:  Soft, nontender and nondistended. Normal bowel sounds, without guarding, and without rebound.   Neurologic:  Alert and  oriented x4;  grossly normal neurologically.  Impression/Plan:     DYSPEPSIA-last EGD incomplete  PLAN:  EGD TODAY.  DISCUSSED PROCEDURE, BENEFITS, & RISKS: < 1% chance of medication reaction, bleeding, perforation, or ASPIRATION.

## 2019-08-24 NOTE — Transfer of Care (Signed)
Immediate Anesthesia Transfer of Care Note  Patient: [name]  Procedure(s) Performed: ESOPHAGOGASTRODUODENOSCOPY (EGD) WITH PROPOFOL (N/A )  Patient Location: PACU  Anesthesia Type:General  Level of Consciousness: awake, alert  and oriented  Airway & Oxygen Therapy: Patient Spontanous Breathing  Post-op Assessment: Report given to RN and Post -op Vital signs reviewed and stable  Post vital signs: Reviewed and stable  Last Vitals:  Vitals Value Taken Time  BP    Temp    Pulse    Resp    SpO2      Last Pain:  Vitals:   08/24/19 0901  TempSrc: Oral  PainSc: 0-No pain         Complications: No apparent anesthesia complications

## 2019-08-24 NOTE — Anesthesia Postprocedure Evaluation (Signed)
Anesthesia Post Note  Patient: KYLINN DILULLO  Procedure(s) Performed: ESOPHAGOGASTRODUODENOSCOPY (EGD) WITH PROPOFOL (N/A )  Patient location during evaluation: PACU Anesthesia Type: General Level of consciousness: awake and alert Pain management: pain level controlled Vital Signs Assessment: post-procedure vital signs reviewed and stable Respiratory status: spontaneous breathing, nonlabored ventilation and respiratory function stable Cardiovascular status: stable Postop Assessment: no apparent nausea or vomiting Anesthetic complications: no     Last Vitals:  Vitals:   08/24/19 0901  BP: 132/86  Pulse: 77  Resp: 18  Temp: 36.8 C  SpO2: 96%    Last Pain:  Vitals:   08/24/19 0901  TempSrc: Oral  PainSc: 0-No pain                 Lilianah Buffin Hristova

## 2019-08-24 NOTE — Op Note (Addendum)
[facility] Patient Name: [name] Procedure Date: 08/24/2019 10:34 AM MRN: [id number] Date of Birth: September 03, 1955 Attending MD: Barney Drain MD, MD CSN: [id number] Age: 64 Admit Type: Outpatient Procedure:                Upper GI endoscopy WITH COLD FORCEPS                            BIOPSY/ESOPHAGEAL DILATION Indications:              Dysphagia, Globus sensation. GES NL IN 2020. BPE                            2018 NL. Providers:                Barney Drain MD, MD, Jeanann Lewandowsky. Sharon Seller, RN, Nelma Rothman, Technician Referring MD:             Asencion Noble Medicines:                Propofol per Anesthesia Complications:            No immediate complications. Estimated Blood Loss:     Estimated blood loss was minimal. Procedure:                Pre-Anesthesia Assessment:                           - Prior to the procedure, a History and Physical                            was performed, and patient medications and                            allergies were reviewed. The patient's tolerance of                            previous anesthesia was also reviewed. The risks                            and benefits of the procedure and the sedation                            options and risks were discussed with the patient.                            All questions were answered, and informed consent                            was obtained. Prior Anticoagulants: The patient has                            taken no previous anticoagulant or antiplatelet  agents. ASA Grade Assessment: II - A patient with                            mild systemic disease. After reviewing the risks                            and benefits, the patient was deemed in                            satisfactory condition to undergo the procedure.                            After obtaining informed consent, the endoscope was                            passed under direct  vision. Throughout the                            procedure, the patient's blood pressure, pulse, and                            oxygen saturations were monitored continuously. The                            [id number]-[id number]) scope was introduced through the                            mouth, and advanced to the second part of duodenum.                            The upper GI endoscopy was accomplished without                            difficulty. The patient tolerated the procedure                            well. Scope In: 10:59:10 AM Scope Out: 11:06:03 AM Total Procedure Duration: 0 hours 6 minutes 53 seconds  Findings:      A low-grade of narrowing Schatzki ring was found at the gastroesophageal       junction. A guidewire was placed and the scope was withdrawn. Dilation       was performed with a Savary dilator with mild resistance at 16 mm and 17       mm.      LA Grade A (one or more mucosal breaks less than 5 mm, not extending       between tops of 2 mucosal folds) esophagitis with no bleeding was found.       Biopsies were obtained from the proximal(20 CM FROM THE INCISORS) and       distal esophagus(37 CM FROM THE INCISORS) with cold forceps for       histology of suspected eosinophilic esophagitis. EGJ 40 CM FROM THE       INCISORS.      The entire examined stomach was normal.      The examined  duodenum was normal.      A medium-sized hiatal hernia was present. Impression:               - Low-grade of narrowing Schatzki ring. Dilated.                           - LA Grade A reflux esophagitis with no bleeding.                            Biopsied. Moderate Sedation:      Per Anesthesia Care Recommendation:           - Patient has a contact number available for                            emergencies. The signs and symptoms of potential                            delayed complications were discussed with the                            patient. Return to normal activities  tomorrow.                            Written discharge instructions were provided to the                            patient.                           - Low fat diet. AVOID REFLUX TRIGGERS.                           - Continue present medications. CONTINUE DEXILANT.                           - Await pathology results.                           - Return to GI office in 4 months. CONSIDER                            REFERRAL FOR [facility]ISSEN IF SYMPTOMS NOT IMPROVED WITH                            EGD/DIL. Procedure Code(s):        --- Professional ---                           [phone], Esophagogastroduodenoscopy, flexible,                            transoral; with insertion of guide wire followed by                            passage of dilator(s) through esophagus over guide  wire                           [id number], 59, Esophagogastroduodenoscopy, flexible,                            transoral; with biopsy, single or multiple Diagnosis Code(s):        --- Professional ---                           K22.2, Esophageal obstruction                           K21.00, Gastro-esophageal reflux disease with                            esophagitis, without bleeding                           R13.10, Dysphagia, unspecified                           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 All rights reserved. The codes documented in this report are preliminary and upon coder review may  be revised to meet current compliance requirements. Barney Drain, MD Barney Drain MD, MD 08/24/2019 11:20:30 AM This report has been signed electronically. Number of Addenda: 0

## 2019-08-24 NOTE — Anesthesia Preprocedure Evaluation (Addendum)
Anesthesia Evaluation  Patient identified by MRN, date of birth, ID band Patient awake    Reviewed: Allergy & Precautions, NPO status , Patient's Chart, lab work & pertinent test results  Airway Mallampati: I  TM Distance: >3 FB Neck ROM: Full    Dental no notable dental hx. (+) Teeth Intact   Pulmonary asthma , former smoker,    Pulmonary exam normal breath sounds clear to auscultation       Cardiovascular Exercise Tolerance: Good hypertension, Pt. 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   Neuromuscular disease CVA, Residual Symptoms    GI/Hepatic Neg liver ROS, GERD  Medicated and Controlled,  Endo/Other  negative endocrine ROS  Renal/GU negative Renal ROS  negative genitourinary   Musculoskeletal  (+) Arthritis , Osteoarthritis,  Fibromyalgia -  Abdominal   Peds negative pediatric ROS (+)  Hematology negative hematology ROS (+)   Anesthesia Other Findings   Reproductive/Obstetrics negative OB ROS                            Anesthesia Physical Anesthesia Plan  ASA: II  Anesthesia Plan: General   Post-op Pain Management:    Induction: Intravenous  PONV Risk Score and Plan: Propofol infusion, TIVA, Treatment may vary due to age or medical condition and Ondansetron  Airway Management Planned: Simple Face Mask and Nasal Cannula  Additional Equipment:   Intra-op Plan:   Post-operative Plan:   Informed Consent: I have reviewed the patients History and Physical, chart, labs and discussed the procedure including the risks, benefits and alternatives for the proposed anesthesia with the patient or authorized representative who has indicated his/her understanding and acceptance.     Dental advisory given  Plan Discussed  with: CRNA  Anesthesia Plan Comments: (Plan Full PPE use  Plan GA with GETA as needed d/w pt -WTP with same after Q&A)        Anesthesia Quick Evaluation

## 2019-08-25 LAB — SURGICAL PATHOLOGY

## 2019-08-25 NOTE — Addendum Note (Signed)
Addendum  created 08/25/19 0945 by Ollen Bowl, CRNA   Flowsheet accepted, Intraprocedure Flowsheets edited

## 2019-08-27 NOTE — Discharge Instructions (Signed)
I DILATED YOUR ESOPHAGUS DUE TO YOUR COMPLAINT OF FEELING THINGS STUCK IN YOUR ESOPHAGUS AND THE BARIUM SWALLOW TEST. YOU HAD MILD ESOPHAGITIS AND gastritis. YOUR SMALL BOWEL LOOKED NORMAL. I biopsied your esophagus.   DRINK WATER TO KEEP YOUR URINE LIGHT YELLOW.  AVOID REFLUX TRIGGERS. SEE INFO BELOW.  CONTINUE DEXILANT.    YOUR BIOPSY RESULTS WILL BE BACK IN 5 BUSINESS DAYS.  Please PLEASE CALL IN ONE MONTH IF YOUR STILL HAVING PROBLEMS SWALLOWING and we can refer you to surgery for the reflux surgery.  FOLLOW UP IN 4 MOS.  UPPER ENDOSCOPY AFTER CARE Read the instructions outlined below and refer to this sheet in the next week. These discharge instructions provide you with general information on caring for yourself after you leave the hospital. While your treatment has been planned according to the most current medical practices available, unavoidable complications occasionally occur. If you have any problems or questions after discharge, call DR. Saira Kramme, [phone].  ACTIVITY  You may resume your regular activity, but move at a slower pace for the next 24 hours.   Take frequent rest periods for the next 24 hours.   Walking will help get rid of the air and reduce the bloated feeling in your belly (abdomen).   No driving for 24 hours (because of the medicine (anesthesia) used during the test).   You may shower.   Do not sign any important legal documents or operate any machinery for 24 hours (because of the anesthesia used during the test).    NUTRITION  Drink plenty of fluids.   You may resume your normal diet as instructed by your doctor.   Begin with a light meal and progress to your normal diet. Heavy or fried foods are harder to digest and may make you feel sick to your stomach (nauseated).   Avoid alcoholic beverages for 24 hours or as instructed.    MEDICATIONS  You may resume your normal medications.   WHAT YOU CAN EXPECT TODAY  Some feelings of  bloating in the abdomen.   Passage of more gas than usual.    IF YOU HAD A BIOPSY TAKEN DURING THE UPPER ENDOSCOPY:  Eat a soft diet IF YOU HAVE NAUSEA, BLOATING, ABDOMINAL PAIN, OR VOMITING.    FINDING OUT THE RESULTS OF YOUR TEST Not all test results are available during your visit. DR. Oneida Alar WILL CALL YOU WITHIN 14 DAYS OF YOUR PROCEDUE WITH YOUR RESULTS. Do not assume everything is normal if you have not heard from DR. Nerissa Constantin, CALL HER OFFICE AT [phone].  SEEK IMMEDIATE MEDICAL ATTENTION AND CALL THE OFFICE: [phone] IF:  You have more than a spotting of blood in your stool.   Your belly is swollen (abdominal distention).   You are nauseated or vomiting.   You have a temperature over 101F.   You have abdominal pain or discomfort that is severe or gets worse throughout the day.   Lifestyle and home remedies TO CONTROL HEARTBURN/REFLUX You may eliminate or reduce the frequency of heartburn by making the following lifestyle changes:  . Control your weight. Being overweight is a major risk factor for heartburn and GERD. Excess pounds put pressure on your abdomen, pushing up your stomach and causing acid to back up into your esophagus.   . Eat smaller meals. 4 TO 6 MEALS A DAY. This reduces pressure on the lower esophageal sphincter, helping to prevent the valve from opening and acid from washing back into your esophagus.   Dolphus Jenny  your belt. Clothes that fit tightly around your waist put pressure on your abdomen and the lower esophageal sphincter.   . Eliminate heartburn triggers. Everyone has specific triggers. Common triggers such as fatty or fried foods, spicy food, tomato sauce, carbonated beverages, alcohol, chocolate, mint, garlic, onion, caffeine and nicotine may make heartburn worse.   Marland Kitchen Avoid stooping or bending. Tying your shoes is OK. Bending over for longer periods to weed your garden isn't, especially soon after eating.   . Don't lie down after a meal.  Wait at least three to four hours after eating before going to bed, and don't lie down right after eating.   Alternative medicine . Several home remedies exist for treating GERD, but they provide only temporary relief. They include drinking baking soda (sodium bicarbonate) added to water or drinking other fluids such as baking soda mixed with cream of tartar and water. . Although these liquids create temporary relief by neutralizing, washing away or buffering acids, eventually they aggravate the situation by adding gas and fluid to your stomach, increasing pressure and causing more acid reflux. Further, adding more sodium to your diet may increase your blood pressure and add stress to your heart, and excessive bicarbonate ingestion can alter the acid-base balance in your body.    Laparoscopic Nissen Fundoplication  Laparoscopic Nissen fundoplication is a surgery to relieve heartburn and other problems caused by gastric fluids flowing up into your esophagus. The esophagus is the part of the body that moves food from your mouth to your stomach. Normally, the muscle that sits between your stomach and your esophagus (lower esophageal sphincter, LES) keeps stomach fluids in your stomach. In some people, the LES does not work properly, and stomach fluids flow up into the esophagus. This can happen when part of the stomach bulges through the LES (hiatal hernia). The backward flow of stomach fluids can cause a type of severe and long-lasting heartburn that is called gastroesophageal reflux disease (GERD). You may need this surgery if other treatments for GERD have not helped. Tell a health care provider about:  Any allergies you have.  All medicines you are taking, including vitamins, herbs, eye drops, creams, and over-the-counter medicines.  Any problems you or family members have had with anesthetic medicines.  Any blood disorders you have.  Any surgeries you have had.  Any medical conditions you  have.  Whether you are pregnant or may be pregnant. What are the risks? Generally, this is a safe procedure. However, problems may occur, including:  Infection.  Bleeding.  Damage to other structures or organs. This can include damage to the lung, causing a collapsed lung.  Trouble swallowing (dysphagia).  Blood clots. What happens before the procedure? Medicines  Ask your health care provider about: ? Changing or stopping your regular medicines. This is especially important if you are taking diabetes medicines or blood thinners. ? Taking medicines such as aspirin and ibuprofen. These medicines can thin your blood. Do not take these medicines unless your health care provider tells you to take them. ? Taking over-the-counter medicines, vitamins, herbs, and supplements. Staying hydrated Follow instructions from your health care provider about hydration, which may include:  Up to 2 hours before the procedure - you may continue to drink clear liquids, such as water, clear fruit juice, black coffee, and plain tea. Eating and drinking restrictions Follow instructions from your health care provider about eating and drinking, which may include:  8 hours before the procedure - stop eating heavy meals  or foods such as meat, fried foods, or fatty foods.  6 hours before the procedure - stop eating light meals or foods, such as toast or cereal.  6 hours before the procedure - stop drinking milk or drinks that contain milk.  2 hours before the procedure - stop drinking clear liquids. General instructions  Plan to have someone take you home from the hospital or clinic.  Ask your health care provider what steps will be taken to help prevent infection. These may include: ? Removing hair at the surgery site. ? Washing skin with a germ-killing soap. What happens during the procedure?  An IV will be inserted into one of your veins.  You will be given a medicine to make you fall asleep  (general anesthetic).  The surgeon will make a small incision in your abdomen and insert a tube through the incision.  Your abdomen will be filled with a gas. This helps the surgeon see your organs better, and it makes more space to work.  The surgeon will insert a thin, lighted tube (laparoscope) through the small incision. This allows your surgeon to see into your abdomen.  The surgeon will make several other small incisions in your abdomen to insert the other instruments that are needed during the procedure.  Another instrument (dilator) will be passed through your mouth and down your esophagus into the upper part of your stomach. The dilator will prevent your LES from being closed too tightly during surgery.  The upper part of your stomach will be wrapped around the lower part of your esophagus and will be stitched into place. This will strengthen the lower esophageal sphincter and prevent reflux.  If you have a hiatal hernia, it will be repaired.  The gas will be released from your abdomen.  All instruments will be removed, and the incisions will be closed with stitches (sutures).  A bandage (dressing) will be placed on your skin over the incisions. The procedure may vary among health care providers and hospitals. What happens after the procedure?  Your blood pressure, heart rate, breathing rate, and blood oxygen level will be monitored until you leave the hospital or clinic.  You will be given pain medicine as needed.  Your IV will be kept in until you are able to drink fluids.  You will be encouraged to get up and walk around as soon as possible. Summary  Laparoscopic Nissen fundoplication is a surgery to relieve heartburn and other problems caused by gastric fluids flowing up into your esophagus.  You may need this surgery if other treatments for GERD have not helped.  Follow instructions from your health care provider about eating and drinking before the  procedure.  Your surgeon will use a thin, lighted tube (laparoscope) that is inserted through a small incision, allowing the surgeon to see into your abdomen. This information is not intended to replace advice given to you by your health care provider. Make sure you discuss any questions you have with your health care provider. Document Revised: 10/11/2017 Document Reviewed: 09/04/2017 Elsevier Patient Education  2020 [employer].

## 2019-09-15 DIAGNOSIS — H2513 Age-related nuclear cataract, bilateral: Secondary | ICD-10-CM | POA: Diagnosis not present

## 2019-09-15 DIAGNOSIS — H349 Unspecified retinal vascular occlusion: Secondary | ICD-10-CM | POA: Diagnosis not present

## 2019-09-15 DIAGNOSIS — H524 Presbyopia: Secondary | ICD-10-CM | POA: Diagnosis not present

## 2019-10-13 DIAGNOSIS — M2042 Other hammer toe(s) (acquired), left foot: Secondary | ICD-10-CM | POA: Diagnosis not present

## 2019-10-13 DIAGNOSIS — M79675 Pain in left toe(s): Secondary | ICD-10-CM | POA: Diagnosis not present

## 2019-10-16 DIAGNOSIS — M25551 Pain in right hip: Secondary | ICD-10-CM | POA: Diagnosis not present

## 2019-10-16 DIAGNOSIS — M797 Fibromyalgia: Secondary | ICD-10-CM | POA: Diagnosis not present

## 2019-10-16 DIAGNOSIS — J4520 Mild intermittent asthma, uncomplicated: Secondary | ICD-10-CM | POA: Diagnosis not present

## 2019-10-16 DIAGNOSIS — I10 Essential (primary) hypertension: Secondary | ICD-10-CM | POA: Diagnosis not present

## 2019-10-20 ENCOUNTER — Other Ambulatory Visit: Payer: Self-pay

## 2019-10-20 ENCOUNTER — Ambulatory Visit (HOSPITAL_COMMUNITY): Payer: PPO | Attending: Podiatry | Admitting: Physical Therapy

## 2019-10-20 ENCOUNTER — Encounter (HOSPITAL_COMMUNITY): Payer: Self-pay | Admitting: Physical Therapy

## 2019-10-20 DIAGNOSIS — R2689 Other abnormalities of gait and mobility: Secondary | ICD-10-CM | POA: Diagnosis not present

## 2019-10-20 DIAGNOSIS — M79672 Pain in left foot: Secondary | ICD-10-CM | POA: Diagnosis not present

## 2019-10-20 NOTE — Therapy (Signed)
[place], [place], [address] Phone: [phone]   Fax:  [phone]  Physical Therapy Evaluation  Patient Details  Name: [name] MRN: [id number] Date of Birth: 1956/03/20 Referring Provider (PT): Steffanie Rainwater DPM    Encounter Date: 10/20/2019  PT End of Session - 10/20/19 1301    Visit Number  1    Number of Visits  8    Date for PT Re-Evaluation  11/20/19   mini reassess 11/06/19   Authorization Type  [employer], no visit limit, no auth req    Authorization Time Period  10/20/19-11/20/19    PT Start Time  0855    PT Stop Time  0945    PT Time Calculation (min)  50 min    Activity Tolerance  Patient tolerated treatment well    Behavior During Therapy  [facility] for tasks assessed/performed       Past Medical History:  Diagnosis Date  . Abnormal Pap smear of cervix   . Anxiety   . Asthma   . Depression   . Essential hypertension   . Fibromyalgia   . GERD (gastroesophageal reflux disease)   . History of kidney stones   . History of stroke   . PVC's (premature ventricular contractions)   . Sciatica of right side     Past Surgical History:  Procedure Laterality Date  . ADENOIDECTOMY    . APPENDECTOMY    . BUNIONECTOMY Bilateral   . CHONDROPLASTY Right 06/19/2018   Procedure: KNEE ARTHROSCOPY WITH chondroplasty;  Surgeon: Carole Civil, MD;  Location: [facility];  Service: Orthopedics;  Laterality: Right;  . COLONOSCOPY  10/08/2011   diverticulosis, hyperplastic polyp removed. next tcs 2023.  Marland Kitchen ESOPHAGOGASTRODUODENOSCOPY N/A 03/12/2019   (EGD) with pyloric dilation;  Surgeon: Danie Binder, MD; nonobstructing Schatzki ring s/p dilation, moderate sized hiatal hernia, mild gastritis s/p biopsy, single gastric polyp s/p biopsy, possible gastroparesis with retained gastric contents.  Pathology benign with no H. pylori.  . ESOPHAGOGASTRODUODENOSCOPY (EGD) WITH PROPOFOL N/A 08/24/2019   Procedure:  ESOPHAGOGASTRODUODENOSCOPY (EGD) WITH PROPOFOL;  Surgeon: Danie Binder, MD;  Location: [facility];  Service: Endoscopy;  Laterality: N/A;  10:30am  . FOOT SURGERY  08/2018  . FOOT SURGERY  05/14/2019  . KNEE SURGERY  05/2018  . POLYPECTOMY  03/12/2019   Procedure: POLYPECTOMY;  Surgeon: Danie Binder, MD;  Location: [facility];  Service: Endoscopy;;  . TONSILLECTOMY    . TUBAL LIGATION      There were no vitals filed for this visit.   Subjective Assessment - 10/20/19 0907    Subjective  Patient presents to physical therapy with complaint of LT foot pain. Patient reports having series of operations done on her toes of LT foot for pain, hammer toe correction, and straightening 2nd toe. Patient says surgeries spanned November 2019 through 9/ 2020 being most recent.  Patient says she had most recent surgery 05/14/2019 for all toes on LT foot. Says she was in a cast for 8 weeks and used crutches to walk. Was non-weight bearing during that time. Says she hit her foot on furniture and dislodged a pin in her toe, which has since had no resolution. Says foot has been stiff and painful since this, has had trouble walking. Says she has pain med prescription but only takes when needed, does not take regularly.    Pertinent History  RT knee meniscus repair in 2019, hx of fibromyalgia  Limitations  Standing;Walking;House hold activities    How long can you stand comfortably?  30 minutes    How long can you walk comfortably?  2-3 hours if not having pain    Patient Stated Goals  reduce pain, walk better    Currently in Pain?  No/denies   No pain currenlty at rest, says pain is worse with weather fluctuation and stress level, also WB        [facility] PT Assessment - 10/20/19 0001      Assessment   Medical Diagnosis  Lt foot pain     Referring Provider (PT)  Steffanie Rainwater DPM     Onset Date/Surgical Date  05/14/19    Prior Therapy  not for foot       Precautions   Precautions  None       Restrictions   Weight Bearing Restrictions  No      Balance Screen   Has the patient fallen in the past 6 months  Yes    How many times?  3   after she had her cast removed    Has the patient had a decrease in activity level because of a fear of falling?   No    Is the patient reluctant to leave their home because of a fear of falling?   No      Home Social worker  Private residence    Living Arrangements  Spouse/significant other    Available Help at Discharge  Family      Prior Function   Level of Independence  Independent      Cognition   Overall Cognitive Status  Within Functional Limits for tasks assessed      Observation/Other Assessments   Focus on Therapeutic Outcomes (FOTO)   35% limitation       Sensation   Light Touch  Appears Intact      ROM / Strength   AROM / PROM / Strength  AROM;Strength      AROM   Overall AROM Comments  WFL except min restricition in LT ankle DF and PF     AROM Assessment Site  Hip;Ankle;Knee    Right/Left Hip  Right;Left    Right/Left Ankle  Right;Left      Strength   Strength Assessment Site  Hip;Knee;Ankle    Right/Left Hip  Right;Left    Right Hip Flexion  4+/5    Right Hip Extension  4+/5    Right Hip ABduction  4+/5    Left Hip Flexion  5/5    Left Hip Extension  4+/5    Left Hip ABduction  4+/5    Right/Left Knee  Right;Left    Right Knee Flexion  4+/5    Right Knee Extension  5/5    Left Knee Flexion  5/5    Left Knee Extension  5/5    Right/Left Ankle  Left;Right    Right Ankle Dorsiflexion  5/5    Right Ankle Plantar Flexion  5/5    Right Ankle Inversion  5/5    Right Ankle Eversion  5/5    Left Ankle Dorsiflexion  5/5    Left Ankle Plantar Flexion  5/5    Left Ankle Inversion  5/5    Left Ankle Eversion  5/5      Palpation   Palpation comment  Min TTP about dorsal aspect of 2nd MTP      Ambulation/Gait   Ambulation/Gait  Yes  Ambulation/Gait Assistance  7: Independent    Assistive  device  None    Gait Pattern  Decreased step length - left   early step off LT   Ambulation Surface  Level;Indoor      Balance   Balance Assessed  Yes      Static Standing Balance   Static Standing Balance -  Activities   Single Leg Stance - Right Leg;Single Leg Stance - Left Leg    Static Standing - Comment/# of Minutes  15 sec; 10 sec min sway                 Objective measurements completed on examination: See above findings.              PT Education - 10/20/19 0927    Education Details  on evaluation findings and POC    Person(s) Educated  Patient    Methods  Explanation    Comprehension  Verbalized understanding       PT Short Term Goals - 10/20/19 1654      PT SHORT TERM GOAL #1   Title  Patient will be independent with initial HEP to improve functional outcomes    Time  2    Period  Weeks    Status  New    Target Date  11/06/19        PT Long Term Goals - 10/20/19 1654      PT LONG TERM GOAL #1   Title  Patient will improve FOTO score to <30% to indicate improvement in functional outcomes    Time  4    Period  Weeks    Status  New    Target Date  11/20/19      PT LONG TERM GOAL #2   Title  Patient will report at least 50% overall improvement in subjective complaint to indicate improvement in ability to perform ADLs.    Time  4    Period  Weeks    Status  New    Target Date  11/20/19      PT LONG TERM GOAL #3   Title  Patient will be able to maintain single limb stance >30 seconds on BLEs to improve stability and reduce risk for falls    Time  4    Period  Weeks    Status  New    Target Date  11/20/19             Plan - 10/20/19 1303    Clinical Impression Statement  Patient is a 64 y.o. female who presents to physical therapy with complaint of Lt foot pain. Patient demonstrates decreased strength, ROM restriction, balance deficits and gait abnormalities which are likely contributing to symptoms of pain and are negatively  impacting patient ability to perform ADLs and functional mobility tasks. Patient will benefit from skilled physical therapy services to address these deficits to reduce pain, improve level of function with ADLs, functional mobility tasks, and reduce risk for falls.    Personal Factors and Comorbidities  Comorbidity 1;Comorbidity 2    Comorbidities  Fibro, multiple surgeries    Examination-Activity Limitations  Stairs;Squat;Locomotion Level    Examination-Participation Restrictions  Community Activity;Yard Work    Stability/Clinical Decision Making  Stable/Uncomplicated    Designer, jewellery  Low    Rehab Potential  Good    PT Frequency  2x / week    PT Duration  4 weeks    PT Treatment/Interventions  ADLs/Self Care Home  Management;Aquatic Therapy;Biofeedback;DME Instruction;Gait training;Stair training;Functional mobility training;Therapeutic activities;Therapeutic exercise;Orthotic Fit/Training;Compression bandaging;Patient/family education;Manual techniques;Neuromuscular re-education;Balance training;Scar mobilization;Passive range of motion;Dry needling;Energy conservation;Joint Manipulations;Splinting;Taping    PT Next Visit Plan  Review goals, issue HEP. Progress as tolerated with focus on Lt foot and ankle mobility, stability, and BLE functional strengthening/ gait training. Add band 4 way, great toe flexion with band, calf stretching, and heel raises next visit.    PT Home Exercise Plan  Issue at next visit    Consulted and Agree with Plan of Care  Patient       Patient will benefit from skilled therapeutic intervention in order to improve the following deficits and impairments:  Abnormal gait, Pain, Improper body mechanics, Decreased mobility, Decreased activity tolerance, Decreased range of motion, Decreased strength, Hypomobility, Decreased balance, Difficulty walking, Impaired flexibility  Visit Diagnosis: Pain in left foot  Other abnormalities of gait and  mobility     Problem List Patient Active Problem List   Diagnosis Date Noted  . Dyspepsia   . Acquired pyloric stenosis   . S/P right knee arthroscopy 06/19/18 06/26/2018  . Chondromalacia of medial femoral condyle, right   . Primary osteoarthritis of right knee   . Allergic reaction 09/16/2017  . Allergic urticaria 09/16/2017  . Angioedema 09/16/2017  . Allergic rhinitis 09/16/2017  . Generalized anxiety disorder 12/03/2016  . Depression 12/03/2016  . GERD (gastroesophageal reflux disease) 12/03/2016  . Personal history of stroke with residual effects 12/03/2016  . Moderate persistent asthma 12/02/2016  . Fibromyalgia   . Adhesive capsulitis of right shoulder 08/31/2013  . Adhesive capsulitis 07/29/2013  . Rotator cuff syndrome of left shoulder 07/29/2013  . Low back pain 12/01/2012  . BUNION 02/23/2008   5:01 PM, 10/20/19 Josue Hector PT DPT  Physical Therapist with [facility]  [phone] [place] [address] [place], [place], [address] Phone: [phone]   Fax:  [phone]  Name: [name] MRN: [id number] Date of Birth: 1956/06/08

## 2019-10-22 ENCOUNTER — Encounter (HOSPITAL_COMMUNITY): Payer: Self-pay

## 2019-10-22 ENCOUNTER — Other Ambulatory Visit: Payer: Self-pay

## 2019-10-22 ENCOUNTER — Ambulatory Visit (HOSPITAL_COMMUNITY): Payer: PPO

## 2019-10-22 DIAGNOSIS — R2689 Other abnormalities of gait and mobility: Secondary | ICD-10-CM

## 2019-10-22 DIAGNOSIS — M79672 Pain in left foot: Secondary | ICD-10-CM | POA: Diagnosis not present

## 2019-10-22 NOTE — Patient Instructions (Signed)
Plantarflexion (Eccentric), (Resistance Band)    Point foot down against resistance band. Slowly release for 3-5 seconds. Use red resistance band. 15 reps per set, 2 sets per day.  http://ecce.exer.us/3   Copyright  VHI. All rights reserved.   Eversion (Eccentric), (Resistance Band)    Push foot outward against resistance band. Slowly release for 3-5 seconds. Use red resistance band. 15 reps per set, 2 sets per day.  http://ecce.exer.us/7   Copyright  VHI. All rights reserved.   Inversion: Resisted    Cross legs with right leg underneath, foot in tubing loop. Hold tubing around other foot to resist and turn foot in. Repeat 15 times per set. Do 2 sets per session.   http://orth.exer.us/13   Copyright  VHI. All rights reserved.   Dorsiflexion (Eccentric), (Resistance Band)    Pull foot up against resistance band. Slowly release for 3-5 seconds. Use red resistance band. 15 reps per set, 2 sets per day.  http://ecce.exer.us/1   Copyright  VHI. All rights reserved.

## 2019-10-22 NOTE — Therapy (Signed)
[place], [place], [address] Phone: [phone]   Fax:  [phone]  Physical Therapy Treatment  Patient Details  Name: [name] MRN: [id number] Date of Birth: 09/09/1955 Referring Provider (PT): Steffanie Rainwater DPM    Encounter Date: 10/22/2019  PT End of Session - 10/22/19 0834    Visit Number  2    Number of Visits  8    Date for PT Re-Evaluation  11/20/19   Minireassess 11/06/19   Authorization Type  [employer], no visit limit, no auth req    Authorization Time Period  10/20/19-11/20/19    PT Start Time  0832    PT Stop Time  0912    PT Time Calculation (min)  40 min    Activity Tolerance  Patient tolerated treatment well    Behavior During Therapy  [facility] for tasks assessed/performed       Past Medical History:  Diagnosis Date  . Abnormal Pap smear of cervix   . Anxiety   . Asthma   . Depression   . Essential hypertension   . Fibromyalgia   . GERD (gastroesophageal reflux disease)   . History of kidney stones   . History of stroke   . PVC's (premature ventricular contractions)   . Sciatica of right side     Past Surgical History:  Procedure Laterality Date  . ADENOIDECTOMY    . APPENDECTOMY    . BUNIONECTOMY Bilateral   . CHONDROPLASTY Right 06/19/2018   Procedure: KNEE ARTHROSCOPY WITH chondroplasty;  Surgeon: Carole Civil, MD;  Location: [facility];  Service: Orthopedics;  Laterality: Right;  . COLONOSCOPY  10/08/2011   diverticulosis, hyperplastic polyp removed. next tcs 2023.  Marland Kitchen ESOPHAGOGASTRODUODENOSCOPY N/A 03/12/2019   (EGD) with pyloric dilation;  Surgeon: Danie Binder, MD; nonobstructing Schatzki ring s/p dilation, moderate sized hiatal hernia, mild gastritis s/p biopsy, single gastric polyp s/p biopsy, possible gastroparesis with retained gastric contents.  Pathology benign with no H. pylori.  . ESOPHAGOGASTRODUODENOSCOPY (EGD) WITH PROPOFOL N/A 08/24/2019   Procedure:  ESOPHAGOGASTRODUODENOSCOPY (EGD) WITH PROPOFOL;  Surgeon: Danie Binder, MD;  [facility];  Service: Endoscopy;  Laterality: N/A;  10:30am  . FOOT SURGERY  08/2018  . FOOT SURGERY  05/14/2019  . KNEE SURGERY  05/2018  . POLYPECTOMY  03/12/2019   Procedure: POLYPECTOMY;  Surgeon: Danie Binder, MD;  Location: [facility];  Service: Endoscopy;;  . TONSILLECTOMY    . TUBAL LIGATION      There were no vitals filed for this visit.  Subjective Assessment - 10/22/19 0834    Subjective  Pt reports she is feeling good this morning, no reports of pain currently.  Reports she realized some extra deficits with foot pain and walking following last session.    Pertinent History  RT knee meniscus repair in 2019, hx of fibromyalgia    Patient Stated Goals  reduce pain, walk better    Currently in Pain?  No/denies         [facility] PT Assessment - 10/22/19 0001      Assessment   Medical Diagnosis  Lt foot pain     Referring Provider (PT)  Steffanie Rainwater DPM     Onset Date/Surgical Date  05/14/19    Next MD Visit  6 weeks, 12/24/19    Prior Therapy  not for foot       AROM   Left Ankle Dorsiflexion  21  Left Ankle Plantar Flexion  30                   [facility] Adult PT Treatment/Exercise - 10/22/19 0001      Exercises   Exercises  Ankle      Ankle Exercises: Stretches   Gastroc Stretch  2 reps;30 seconds    Gastroc Stretch Limitations  wall stretch      Ankle Exercises: Standing   Heel Raises  15 reps      Ankle Exercises: Seated   Towel Crunch  1 rep      Ankle Exercises: Supine   T-Band  4way RTB 15x each             PT Education - 10/22/19 0856    Education Details  Reviewed goals, educated importance of HEP compliance and established this session.    Person(s) Educated  Patient    Methods  Explanation;Handout    Comprehension  Verbalized understanding;Returned demonstration       PT Short Term Goals - 10/20/19 1654      PT SHORT TERM GOAL #1    Title  Patient will be independent with initial HEP to improve functional outcomes    Time  2    Period  Weeks    Status  New    Target Date  11/06/19        PT Long Term Goals - 10/20/19 1654      PT LONG TERM GOAL #1   Title  Patient will improve FOTO score to <30% to indicate improvement in functional outcomes    Time  4    Period  Weeks    Status  New    Target Date  11/20/19      PT LONG TERM GOAL #2   Title  Patient will report at least 50% overall improvement in subjective complaint to indicate improvement in ability to perform ADLs.    Time  4    Period  Weeks    Status  New    Target Date  11/20/19      PT LONG TERM GOAL #3   Title  Patient will be able to maintain single limb stance >30 seconds on BLEs to improve stability and reduce risk for falls    Time  4    Period  Weeks    Status  New    Target Date  11/20/19            Plan - 10/22/19 0915    Clinical Impression Statement  Reviewed goals, educated importance of compliance with HEP and established home exercise program this session.  Session focus wiht ankle mobility with stretches and strengthening exercises.  Min cueing to reduce compensation with hip ER during eversion exercises, corrected with verbal and tactile cueing.  No reoprts of pain through session.    Personal Factors and Comorbidities  Comorbidity 1;Comorbidity 2    Comorbidities  Fibro, multiple surgeries    Examination-Activity Limitations  Stairs;Squat;Locomotion Level    Examination-Participation Restrictions  Community Activity;Yard Work    Stability/Clinical Decision Making  Stable/Uncomplicated    Designer, jewellery  Low    Rehab Potential  Good    PT Frequency  2x / week    PT Duration  4 weeks    PT Treatment/Interventions  ADLs/Self Care Home Management;Aquatic Therapy;Biofeedback;DME Instruction;Gait training;Stair training;Functional mobility training;Therapeutic activities;Therapeutic exercise;Orthotic  Fit/Training;Compression bandaging;Patient/family education;Manual techniques;Neuromuscular re-education;Balance training;Scar mobilization;Passive range of motion;Dry needling;Energy conservation;Joint Manipulations;Splinting;Taping  PT Next Visit Plan  F/U with theraband compliance. Progress as tolerated with focus on Lt foot and ankle mobility, stability, and BLE functional strengthening/ gait training. Add rocker board, weight shifting for toe stretches (reverse lunge) next session.    PT Home Exercise Plan  10/22/19:  RTB each direction       Patient will benefit from skilled therapeutic intervention in order to improve the following deficits and impairments:  Abnormal gait, Pain, Improper body mechanics, Decreased mobility, Decreased activity tolerance, Decreased range of motion, Decreased strength, Hypomobility, Decreased balance, Difficulty walking, Impaired flexibility  Visit Diagnosis: Pain in left foot  Other abnormalities of gait and mobility     Problem List Patient Active Problem List   Diagnosis Date Noted  . Dyspepsia   . Acquired pyloric stenosis   . S/P right knee arthroscopy 06/19/18 06/26/2018  . Chondromalacia of medial femoral condyle, right   . Primary osteoarthritis of right knee   . Allergic reaction 09/16/2017  . Allergic urticaria 09/16/2017  . Angioedema 09/16/2017  . Allergic rhinitis 09/16/2017  . Generalized anxiety disorder 12/03/2016  . Depression 12/03/2016  . GERD (gastroesophageal reflux disease) 12/03/2016  . Personal history of stroke with residual effects 12/03/2016  . Moderate persistent asthma 12/02/2016  . Fibromyalgia   . Adhesive capsulitis of right shoulder 08/31/2013  . Adhesive capsulitis 07/29/2013  . Rotator cuff syndrome of left shoulder 07/29/2013  . Low back pain 12/01/2012  . BUNION 02/23/2008   Ihor Austin, LPTA/CLT; CBIS [phone]  Aldona Lento 10/22/2019, 9:23 AM  [place] [address] [place], [place], [address] Phone: [phone]   Fax:  [phone]  Name: [name] MRN: [id number] Date of Birth: 1956/07/20

## 2019-10-23 DIAGNOSIS — M25551 Pain in right hip: Secondary | ICD-10-CM | POA: Diagnosis not present

## 2019-10-23 DIAGNOSIS — M545 Low back pain: Secondary | ICD-10-CM | POA: Diagnosis not present

## 2019-10-27 ENCOUNTER — Ambulatory Visit (HOSPITAL_COMMUNITY): Payer: PPO | Admitting: Physical Therapy

## 2019-10-27 ENCOUNTER — Other Ambulatory Visit: Payer: Self-pay

## 2019-10-27 ENCOUNTER — Encounter (HOSPITAL_COMMUNITY): Payer: Self-pay | Admitting: Physical Therapy

## 2019-10-27 DIAGNOSIS — M79672 Pain in left foot: Secondary | ICD-10-CM

## 2019-10-27 DIAGNOSIS — R2689 Other abnormalities of gait and mobility: Secondary | ICD-10-CM

## 2019-10-27 NOTE — Therapy (Signed)
[place] [address] [place], [place], [address] Phone: [phone]   Fax:  [phone]  Physical Therapy Treatment/ Discharge Summary   Patient Details  Name: [name] MRN: [id number] Date of Birth: [birth year] Referring Provider (PT): Steffanie Rainwater DPM    Encounter Date: 10/27/2019   PHYSICAL THERAPY DISCHARGE SUMMARY  Visits from Start of Care: 3  Current functional level related to goals / functional outcomes: See below    Remaining deficits: See below   Education / Equipment: See assessment  Plan: Patient agrees to discharge.  Patient goals were not met. Patient is being discharged due to the patient's request.  ?????       PT End of Session - 10/27/19 0827    Visit Number  3    Number of Visits  8    Date for PT Re-Evaluation  11/20/19   Minireassess 11/06/19   Authorization Type  [employer], no visit limit, no auth req    Authorization Time Period  10/20/19-11/20/19    PT Start Time  0815    PT Stop Time  0850    PT Time Calculation (min)  35 min    Activity Tolerance  Patient tolerated treatment well    Behavior During Therapy  [facility] for tasks assessed/performed       Past Medical History:  Diagnosis Date  . Abnormal Pap smear of cervix   . Anxiety   . Asthma   . Depression   . Essential hypertension   . Fibromyalgia   . GERD (gastroesophageal reflux disease)   . History of kidney stones   . History of stroke   . PVC's (premature ventricular contractions)   . Sciatica of right side     Past Surgical History:  Procedure Laterality Date  . ADENOIDECTOMY    . APPENDECTOMY    . BUNIONECTOMY Bilateral   . CHONDROPLASTY Right 06/19/2018   Procedure: KNEE ARTHROSCOPY WITH chondroplasty;  Surgeon: Carole Civil, MD;  Location: [facility];  Service: Orthopedics;  Laterality: Right;  . COLONOSCOPY  10/08/2011   diverticulosis, hyperplastic polyp removed. next tcs 2023.  Marland Kitchen  ESOPHAGOGASTRODUODENOSCOPY N/A 03/12/2019   (EGD) with pyloric dilation;  Surgeon: Danie Binder, MD; nonobstructing Schatzki ring s/p dilation, moderate sized hiatal hernia, mild gastritis s/p biopsy, single gastric polyp s/p biopsy, possible gastroparesis with retained gastric contents.  Pathology benign with no H. pylori.  . ESOPHAGOGASTRODUODENOSCOPY (EGD) WITH PROPOFOL N/A 08/24/2019   Procedure: ESOPHAGOGASTRODUODENOSCOPY (EGD) WITH PROPOFOL;  Surgeon: Danie Binder, MD;  [facility];  Service: Endoscopy;  Laterality: N/A;  10:30am  . FOOT SURGERY  08/2018  . FOOT SURGERY  05/14/2019  . KNEE SURGERY  05/2018  . POLYPECTOMY  03/12/2019   Procedure: POLYPECTOMY;  Surgeon: Danie Binder, MD;  [facility]: [facility];  Service: Endoscopy;;  . TONSILLECTOMY    . TUBAL LIGATION      There were no vitals filed for this visit.  Subjective Assessment - 10/27/19 0828    Subjective  Patient says her hip has been bothering her much more lately. Says she has been doing therapy exercise at home but does not feel she can progress to WB activity due to her hip. Says she would like to be done with therapy for her foot at this time to address her hip issues and will return as needed.    Pertinent History  RT knee meniscus repair in 2019, hx of fibromyalgia  Limitations  Standing;Walking;House hold activities    Patient Stated Goals  reduce pain, walk better    Currently in Pain?  Yes    Pain Score  10-Worst pain ever    Pain Location  Hip    Pain Orientation  Right;Posterior    Pain Descriptors / Indicators  Aching    Pain Type  Chronic pain    Pain Onset  More than a month ago    Pain Frequency  Constant    Effect of Pain on Daily Activities  Limits         [facility] PT Assessment - 10/27/19 0001      Assessment   Medical Diagnosis  Lt foot pain     Referring Provider (PT)  Steffanie Rainwater DPM     Onset Date/Surgical Date  05/14/19    Prior Therapy  not for foot       Precautions    Precautions  None      Restrictions   Weight Bearing Restrictions  No      Home Environment   Living Environment  Private residence    Living Arrangements  Spouse/significant other      Prior Function   Level of Independence  Independent      Cognition   Overall Cognitive Status  Within Functional Limits for tasks assessed      Observation/Other Assessments   Focus on Therapeutic Outcomes (FOTO)   35% limitation       Sensation   Light Touch  Appears Intact                   [facility] Adult PT Treatment/Exercise - 10/27/19 0001      Manual Therapy   Manual Therapy  Soft tissue mobilization;Passive ROM    Manual therapy comments  manual performed separate from all other exercises    Soft tissue mobilization  IASTM to distal plantar LT foot and intrinsics surrounding 2nd toe     Passive ROM  PROM extension to 2nd toe              PT Education - 10/27/19 0853    Education Details  on continuation of HEP, progressions and DC status. Patient issued green band for HEP progressions    Person(s) Educated  Patient    Methods  Explanation    Comprehension  Verbalized understanding       PT Short Term Goals - 10/27/19 0857      PT SHORT TERM GOAL #1   Title  Patient will be independent with initial HEP to improve functional outcomes    Time  2    Period  Weeks    Status  Achieved    Target Date  11/06/19        PT Long Term Goals - 10/27/19 0857      PT LONG TERM GOAL #1   Title  Patient will improve FOTO score to <30% to indicate improvement in functional outcomes    Time  4    Period  Weeks    Status  Not Met      PT LONG TERM GOAL #2   Title  Patient will report at least 50% overall improvement in subjective complaint to indicate improvement in ability to perform ADLs.    Time  4    Period  Weeks    Status  Not Met      PT LONG TERM GOAL #3   Title  Patient will be able to  maintain single limb stance >30 seconds on BLEs to improve stability  and reduce risk for falls    Time  4    Period  Weeks    Status  Not Met            Plan - 10/27/19 0854    Clinical Impression Statement  Patient limited in progress toward therapy goals due to increased pain in RT hip. Patient says she is familiar with foot HEP, but does not feel she is able to progress them and add WB activity at this time due to increased RT hip pain. Patient says she has tried exercise and stretching for RT hip from previous therapy sessions and nothing is helping she has f/u with hip MD this week and would like to DC foot therapy at this time until these issues are resolved. Performed manual IASTM and PROM to LT foot and 2nd toe to address restrictions and mobility deficits. Educated patient on continued HEP for foot and to f/u with therapy once hip issues are addressed/ resolved. Patient agreed to this plan. Patient being DC from therapy today per patient request.    Personal Factors and Comorbidities  Comorbidity 1;Comorbidity 2    Comorbidities  Fibro, multiple surgeries    Examination-Activity Limitations  Stairs;Squat;Locomotion Level    Examination-Participation Restrictions  Community Activity;Yard Work    Stability/Clinical Decision Making  Stable/Uncomplicated    Rehab Potential  Good    PT Frequency  2x / week    PT Duration  4 weeks    PT Treatment/Interventions  ADLs/Self Care Home Management;Aquatic Therapy;Biofeedback;DME Instruction;Gait training;Stair training;Functional mobility training;Therapeutic activities;Therapeutic exercise;Orthotic Fit/Training;Compression bandaging;Patient/family education;Manual techniques;Neuromuscular re-education;Balance training;Scar mobilization;Passive range of motion;Dry needling;Energy conservation;Joint Manipulations;Splinting;Taping    PT Next Visit Plan  DC to HEP    PT Home Exercise Plan  10/22/19:  RTB each direction    Consulted and Agree with Plan of Care  Patient       Patient will benefit from skilled  therapeutic intervention in order to improve the following deficits and impairments:  Abnormal gait, Pain, Improper body mechanics, Decreased mobility, Decreased activity tolerance, Decreased range of motion, Decreased strength, Hypomobility, Decreased balance, Difficulty walking, Impaired flexibility  Visit Diagnosis: Pain in left foot  Other abnormalities of gait and mobility     Problem List Patient Active Problem List   Diagnosis Date Noted  . Dyspepsia   . Acquired pyloric stenosis   . S/P right knee arthroscopy 06/19/18 06/26/2018  . Chondromalacia of medial femoral condyle, right   . Primary osteoarthritis of right knee   . Allergic reaction 09/16/2017  . Allergic urticaria 09/16/2017  . Angioedema 09/16/2017  . Allergic rhinitis 09/16/2017  . Generalized anxiety disorder 12/03/2016  . Depression 12/03/2016  . GERD (gastroesophageal reflux disease) 12/03/2016  . Personal history of stroke with residual effects 12/03/2016  . Moderate persistent asthma 12/02/2016  . Fibromyalgia   . Adhesive capsulitis of right shoulder 08/31/2013  . Adhesive capsulitis 07/29/2013  . Rotator cuff syndrome of left shoulder 07/29/2013  . Low back pain 12/01/2012  . BUNION 02/23/2008   8:59 AM, 10/27/19 Josue Hector PT DPT  Physical Therapist with [facility]  [phone] [place] [address] [place], [place], [address] Phone: [phone]   Fax:  [phone]  Name: [name] MRN: [id number] Date of Birth: May 10, 1956

## 2019-10-29 ENCOUNTER — Ambulatory Visit (HOSPITAL_COMMUNITY): Payer: PPO | Admitting: Physical Therapy

## 2019-11-03 ENCOUNTER — Encounter (HOSPITAL_COMMUNITY): Payer: PPO | Admitting: Physical Therapy

## 2019-11-05 ENCOUNTER — Encounter (HOSPITAL_COMMUNITY): Payer: PPO | Admitting: Physical Therapy

## 2019-11-06 DIAGNOSIS — G8929 Other chronic pain: Secondary | ICD-10-CM | POA: Diagnosis not present

## 2019-11-06 DIAGNOSIS — M87051 Idiopathic aseptic necrosis of right femur: Secondary | ICD-10-CM | POA: Diagnosis not present

## 2019-11-06 DIAGNOSIS — K5730 Diverticulosis of large intestine without perforation or abscess without bleeding: Secondary | ICD-10-CM | POA: Diagnosis not present

## 2019-11-06 DIAGNOSIS — M879 Osteonecrosis, unspecified: Secondary | ICD-10-CM | POA: Diagnosis not present

## 2019-11-06 DIAGNOSIS — M25551 Pain in right hip: Secondary | ICD-10-CM | POA: Diagnosis not present

## 2019-11-13 DIAGNOSIS — M25551 Pain in right hip: Secondary | ICD-10-CM | POA: Diagnosis not present

## 2019-11-27 DIAGNOSIS — M25551 Pain in right hip: Secondary | ICD-10-CM | POA: Diagnosis not present

## 2019-12-09 DIAGNOSIS — J45901 Unspecified asthma with (acute) exacerbation: Secondary | ICD-10-CM | POA: Diagnosis not present

## 2019-12-11 DIAGNOSIS — M25551 Pain in right hip: Secondary | ICD-10-CM | POA: Diagnosis not present

## 2019-12-15 DIAGNOSIS — M722 Plantar fascial fibromatosis: Secondary | ICD-10-CM | POA: Diagnosis not present

## 2019-12-18 DIAGNOSIS — M25551 Pain in right hip: Secondary | ICD-10-CM | POA: Diagnosis not present

## 2019-12-22 ENCOUNTER — Telehealth: Payer: PPO | Admitting: Gastroenterology

## 2019-12-22 ENCOUNTER — Other Ambulatory Visit: Payer: Self-pay

## 2019-12-22 ENCOUNTER — Encounter: Payer: Self-pay | Admitting: Gastroenterology

## 2019-12-22 ENCOUNTER — Telehealth (INDEPENDENT_AMBULATORY_CARE_PROVIDER_SITE_OTHER): Payer: PPO | Admitting: Gastroenterology

## 2019-12-22 DIAGNOSIS — R0989 Other specified symptoms and signs involving the circulatory and respiratory systems: Secondary | ICD-10-CM | POA: Diagnosis not present

## 2019-12-22 NOTE — Patient Instructions (Signed)
Please let us know when you are ready to move forward with the procedures.  We will see you in 6 months regardless!  I am sorry to hear of what is going on, and you will be in our thoughts.  It was a pleasure to see you today. I want to create trusting relationships with patients to provide genuine, compassionate, and quality care. I value your feedback. If you receive a survey regarding your visit,  I greatly appreciate you taking time to fill this out.   Annitta Needs, PhD, ANP-BC [facility]

## 2019-12-22 NOTE — Progress Notes (Signed)
Primary Care Physician:  Asencion Noble, MD  Primary GI: Dr. Oneida Alar   Patient Location: Home   Provider Location: [facility] office   Reason for Visit: Follow-up    Persons present on the virtual encounter, with roles: Patient and NP   Total time (minutes) spent on medical discussion: 15 minutes   Due to COVID-19, visit was conducted using virtual method.  Visit was requested by patient.  Virtual Visit via MyChart Video Visit Note Due to COVID-19, visit is conducted virtually and was requested by patient.   I connected with [name] on 12/23/19 at 10:30 AM EDT by video and verified that I am speaking with the correct person using two identifiers.   I discussed the limitations, risks, security and privacy concerns of performing an evaluation and management service by telephone and the availability of in person appointments. I also discussed with the patient that there may be a patient responsible charge related to this service. The patient expressed understanding and agreed to proceed.  Chief Complaint  Patient presents with  . feels like something stuck in throat    no trouble swallowing; unsure if it's allergy related; no feeling of reflux     History of Present Illness: 64 year old female presenting with history of GERD, chronic globus sensation, following after recent EGD Jan 2021 with Schatzki ring s/p dilation, reflux esophagitis, normal esophageal biopsies. Recommendations for surgical referral if persistent symptoms due to refractory GERD.    EGD on 03/12/2019. nonobstructing Schatzki ring s/p dilation, moderate sized hiatal hernia, mild gastritis s/p biopsied, single gastric polyp s/p biopsied, possible gastroparesis suspected due to retained gastric contents. GES normal. Repeat EGD Jan 2021 with normal stomach, low-grade Schatzki ring s/p dilation, LA Grade A reflux esophagitis, s/p biopsy. Normal esophageal biopsies.  Feels like something is stuck in her  throat. Has been on rounds of prednisone. Feels like a furball. Chronic. Dexilant BID. Still has to take baking soda, ginger ale to help burp. Getting gas out. Feels very bloated. Feels pressure. Burps and feels better. No pain with swallowing. Saw ENT in 2019 [facility]). Saw allergist. Declining to see ENT again.   Ex-husband has stage IV cancer, now in palliative care. Patient is helping to care for him. Wants to hold off on procedures until things have calmed down. Declining ENT referral again. Would like to hold off on surgical referral but willing to do pH/manometry.      Past Medical History:  Diagnosis Date  . Abnormal Pap smear of cervix   . Anxiety   . Asthma   . Depression   . Essential hypertension   . Fibromyalgia   . GERD (gastroesophageal reflux disease)   . History of kidney stones   . History of stroke   . PVC's (premature ventricular contractions)   . Sciatica of right side      Past Surgical History:  Procedure Laterality Date  . ADENOIDECTOMY    . APPENDECTOMY    . BUNIONECTOMY Bilateral   . CHONDROPLASTY Right 06/19/2018   Procedure: KNEE ARTHROSCOPY WITH chondroplasty;  Surgeon: Carole Civil, MD;  Location: [facility];  Service: Orthopedics;  Laterality: Right;  . COLONOSCOPY  10/08/2011   diverticulosis, hyperplastic polyp removed. next tcs 2023.  Marland Kitchen ESOPHAGOGASTRODUODENOSCOPY N/A 03/12/2019   (EGD) with pyloric dilation;  Surgeon: Danie Binder, MD; nonobstructing Schatzki ring s/p dilation, moderate sized hiatal hernia, mild gastritis s/p biopsy, single gastric polyp s/p biopsy, possible gastroparesis with retained gastric  contents.  Pathology benign with no H. pylori.  . ESOPHAGOGASTRODUODENOSCOPY (EGD) WITH PROPOFOL N/A 08/24/2019   normal stomach, low-grade Schatzki ring s/p dilation, LA Grade A reflux esophagitis, s/p biopsy. Normal esophageal biopsies  . FOOT SURGERY  08/2018  . FOOT SURGERY  05/14/2019  . KNEE SURGERY  05/2018  . POLYPECTOMY   03/12/2019   Procedure: POLYPECTOMY;  Surgeon: Danie Binder, MD;  Location: [facility];  Service: Endoscopy;;  . TONSILLECTOMY    . TUBAL LIGATION       Current Meds  Medication Sig  . ALPRAZolam (XANAX) 1 MG tablet Take 1 mg by mouth 4 (four) times daily as needed for anxiety.   Marland Kitchen amLODipine (NORVASC) 5 MG tablet Take 5 mg by mouth daily.  . budesonide-formoterol (SYMBICORT) 160-4.5 MCG/ACT inhaler Inhale 2 puffs into the lungs 2 (two) times daily.  Marland Kitchen dexlansoprazole (DEXILANT) 60 MG capsule Take 60 mg by mouth 2 (two) times daily.  . diphenhydrAMINE (BENADRYL) 25 mg capsule Take 25 mg by mouth every 8 (eight) hours as needed for itching or allergies.   Marland Kitchen EPINEPHrine (EPIPEN 2-PAK) 0.3 mg/0.3 mL IJ SOAJ injection Inject 0.3 mg into the muscle as needed for anaphylaxis.  . furosemide (LASIX) 40 MG tablet Take 40 mg by mouth daily as needed for fluid.  Marland Kitchen guaiFENesin (MUCINEX) 600 MG 12 hr tablet Take 600 mg by mouth 2 (two) times daily.  . methocarbamol (ROBAXIN) 500 MG tablet Take 500 mg by mouth 2 (two) times daily.  Marland Kitchen PROAIR HFA 108 (90 Base) MCG/ACT inhaler Inhale 2 puffs into the lungs as needed for wheezing or shortness of breath.   . topiramate (TOPAMAX) 200 MG tablet Take 200 mg by mouth 2 (two) times daily.      Family History  Problem Relation Age of Onset  . Hypertension Mother   . Diverticulosis Mother   . Hypertension Father   . AAA (abdominal aortic aneurysm) Father   . Cancer Brother        not sure where primary, was metastatic. was jaundice.   Marland Kitchen COPD Brother   . Angioedema Neg Hx   . Atopy Neg Hx   . Eczema Neg Hx   . Immunodeficiency Neg Hx   . Urticaria Neg Hx   . Asthma Neg Hx   . Allergic rhinitis Neg Hx   . Colon cancer Neg Hx     Social History   Socioeconomic History  . Marital status: Married    Spouse name: Not on file  . Number of children: Not on file  . Years of education: Not on file  . Highest education level: Not on file    Occupational History  . Not on file  Tobacco Use  . Smoking status: Former Smoker    Packs/day: 0.25    Years: 1.00    Pack years: 0.25    Types: Cigarettes    Quit date: 06/17/1975    Years since quitting: 44.5  . Smokeless tobacco: Never Used  Substance and Sexual Activity  . Alcohol use: No  . Drug use: No  . Sexual activity: Yes    Birth control/protection: Surgical, Post-menopausal    Comment: tubal  Other Topics Concern  . Not on file  Social History Narrative  . Not on file   Social Determinants of Health   Financial Resource Strain:   . Difficulty of Paying Living Expenses:   Food Insecurity:   . Worried About Charity fundraiser in the Last  Year:   . Ran Out of Food in the Last Year:   Transportation Needs:   . Film/video editor (Medical):   Marland Kitchen Lack of Transportation (Non-Medical):   Physical Activity:   . Days of Exercise per Week:   . Minutes of Exercise per Session:   Stress:   . Feeling of Stress :   Social Connections:   . Frequency of Communication with Friends and Family:   . Frequency of Social Gatherings with Friends and Family:   . Attends Religious Services:   . Active Member of Clubs or Organizations:   . Attends Archivist Meetings:   Marland Kitchen Marital Status:        Review of Systems: Gen: Denies fever, chills, anorexia. Denies fatigue, weakness, weight loss.  CV: Denies chest pain, palpitations, syncope, peripheral edema, and claudication. Resp: Denies dyspnea at rest, cough, wheezing, coughing up blood, and pleurisy. GI: see HPI Derm: Denies rash, itching, dry skin Psych: Denies depression, anxiety, memory loss, confusion. No homicidal or suicidal ideation.  Heme: Denies bruising, bleeding, and enlarged lymph nodes.  Observations/Objective: No distress. Unable to perform physical exam due to video encounter. Maintains eye contact. Pleasant and cooperative.   Assessment and Plan: 64 year old female with history of chronic  GERD, globus sensation, s/p EGD/dilation last year and most recently Jan 2021, continuing with severe globus sensation. Remains on Dexilant BID, reporting pressure, belching, and bloating. GES normal. [facility] around 2019. She is declining referral back to ENT and does not want to pursue surgical evaluation for Nissen at this time. Requesting non-operative measures currently. Can pursue pH/manometry in future, but she would like to call back to schedule this due to demanding schedule with ex-husband who is in palliative care. I have asked her to call us when she is ready to proceed. Recommend ENT referral again if patient is willing in the future but declines this currently. Return in 6 months.   Follow Up Instructions: See AVS   I discussed the assessment and treatment plan with the patient. The patient was provided an opportunity to ask questions and all were answered. The patient agreed with the plan and demonstrated an understanding of the instructions.   The patient was advised to call back or seek an in-person evaluation if the symptoms worsen or if the condition fails to improve as anticipated.    Annitta Needs, PhD, ANP-BC [facility]

## 2019-12-25 DIAGNOSIS — M87059 Idiopathic aseptic necrosis of unspecified femur: Secondary | ICD-10-CM | POA: Diagnosis not present

## 2019-12-25 DIAGNOSIS — Z6827 Body mass index (BMI) 27.0-27.9, adult: Secondary | ICD-10-CM | POA: Diagnosis not present

## 2019-12-25 DIAGNOSIS — N959 Unspecified menopausal and perimenopausal disorder: Secondary | ICD-10-CM | POA: Diagnosis not present

## 2019-12-25 DIAGNOSIS — J45909 Unspecified asthma, uncomplicated: Secondary | ICD-10-CM | POA: Diagnosis not present

## 2019-12-25 DIAGNOSIS — I10 Essential (primary) hypertension: Secondary | ICD-10-CM | POA: Diagnosis not present

## 2020-01-11 ENCOUNTER — Encounter (HOSPITAL_COMMUNITY): Admission: RE | Admit: 2020-01-11 | Payer: PPO | Source: Ambulatory Visit

## 2020-01-11 ENCOUNTER — Encounter (HOSPITAL_COMMUNITY): Payer: PPO

## 2020-01-22 ENCOUNTER — Ambulatory Visit: Admit: 2020-01-22 | Payer: PPO | Admitting: Orthopedic Surgery

## 2020-01-22 SURGERY — ARTHROPLASTY, HIP, TOTAL,POSTERIOR APPROACH
Anesthesia: Choice | Site: Hip | Laterality: Right

## 2020-02-25 DIAGNOSIS — Z6827 Body mass index (BMI) 27.0-27.9, adult: Secondary | ICD-10-CM | POA: Diagnosis not present

## 2020-02-25 DIAGNOSIS — M85472 Solitary bone cyst, left ankle and foot: Secondary | ICD-10-CM | POA: Diagnosis not present

## 2020-02-25 DIAGNOSIS — R21 Rash and other nonspecific skin eruption: Secondary | ICD-10-CM | POA: Diagnosis not present

## 2020-02-25 DIAGNOSIS — M797 Fibromyalgia: Secondary | ICD-10-CM | POA: Diagnosis not present

## 2020-02-29 ENCOUNTER — Other Ambulatory Visit: Payer: Self-pay | Admitting: Obstetrics and Gynecology

## 2020-02-29 NOTE — Telephone Encounter (Signed)
Phentermine 37.5 Rx, 1/2 to 1 tab q 3 d for Chronic Fatigue filled

## 2020-03-08 DIAGNOSIS — M79672 Pain in left foot: Secondary | ICD-10-CM | POA: Diagnosis not present

## 2020-03-08 DIAGNOSIS — M205X2 Other deformities of toe(s) (acquired), left foot: Secondary | ICD-10-CM | POA: Diagnosis not present

## 2020-03-08 DIAGNOSIS — R2242 Localized swelling, mass and lump, left lower limb: Secondary | ICD-10-CM | POA: Diagnosis not present

## 2020-03-08 DIAGNOSIS — M21612 Bunion of left foot: Secondary | ICD-10-CM | POA: Diagnosis not present

## 2020-03-08 DIAGNOSIS — M7742 Metatarsalgia, left foot: Secondary | ICD-10-CM | POA: Diagnosis not present

## 2020-03-14 DIAGNOSIS — M19012 Primary osteoarthritis, left shoulder: Secondary | ICD-10-CM | POA: Diagnosis not present

## 2020-03-18 DIAGNOSIS — M25551 Pain in right hip: Secondary | ICD-10-CM | POA: Diagnosis not present

## 2020-03-25 DIAGNOSIS — M79672 Pain in left foot: Secondary | ICD-10-CM | POA: Diagnosis not present

## 2020-04-01 DIAGNOSIS — M21619 Bunion of unspecified foot: Secondary | ICD-10-CM | POA: Diagnosis not present

## 2020-04-01 DIAGNOSIS — T8484XA Pain due to internal orthopedic prosthetic devices, implants and grafts, initial encounter: Secondary | ICD-10-CM | POA: Diagnosis not present

## 2020-04-01 DIAGNOSIS — M2022 Hallux rigidus, left foot: Secondary | ICD-10-CM | POA: Diagnosis not present

## 2020-04-01 DIAGNOSIS — M205X9 Other deformities of toe(s) (acquired), unspecified foot: Secondary | ICD-10-CM | POA: Diagnosis not present

## 2020-04-18 DIAGNOSIS — H9202 Otalgia, left ear: Secondary | ICD-10-CM | POA: Diagnosis not present

## 2020-04-18 DIAGNOSIS — R05 Cough: Secondary | ICD-10-CM | POA: Diagnosis not present

## 2020-04-21 DIAGNOSIS — B078 Other viral warts: Secondary | ICD-10-CM | POA: Diagnosis not present

## 2020-04-21 DIAGNOSIS — L820 Inflamed seborrheic keratosis: Secondary | ICD-10-CM | POA: Diagnosis not present

## 2020-04-21 DIAGNOSIS — R208 Other disturbances of skin sensation: Secondary | ICD-10-CM | POA: Diagnosis not present

## 2020-04-26 DIAGNOSIS — M2022 Hallux rigidus, left foot: Secondary | ICD-10-CM | POA: Diagnosis not present

## 2020-04-26 DIAGNOSIS — M2042 Other hammer toe(s) (acquired), left foot: Secondary | ICD-10-CM | POA: Diagnosis not present

## 2020-04-26 DIAGNOSIS — G8918 Other acute postprocedural pain: Secondary | ICD-10-CM | POA: Diagnosis not present

## 2020-04-26 DIAGNOSIS — T8484XA Pain due to internal orthopedic prosthetic devices, implants and grafts, initial encounter: Secondary | ICD-10-CM | POA: Diagnosis not present

## 2020-05-11 DIAGNOSIS — M9901 Segmental and somatic dysfunction of cervical region: Secondary | ICD-10-CM | POA: Diagnosis not present

## 2020-05-11 DIAGNOSIS — M9905 Segmental and somatic dysfunction of pelvic region: Secondary | ICD-10-CM | POA: Diagnosis not present

## 2020-05-11 DIAGNOSIS — M546 Pain in thoracic spine: Secondary | ICD-10-CM | POA: Diagnosis not present

## 2020-05-11 DIAGNOSIS — M545 Low back pain: Secondary | ICD-10-CM | POA: Diagnosis not present

## 2020-05-11 DIAGNOSIS — M25551 Pain in right hip: Secondary | ICD-10-CM | POA: Diagnosis not present

## 2020-05-11 DIAGNOSIS — M5413 Radiculopathy, cervicothoracic region: Secondary | ICD-10-CM | POA: Diagnosis not present

## 2020-05-11 DIAGNOSIS — M9902 Segmental and somatic dysfunction of thoracic region: Secondary | ICD-10-CM | POA: Diagnosis not present

## 2020-05-11 DIAGNOSIS — M9903 Segmental and somatic dysfunction of lumbar region: Secondary | ICD-10-CM | POA: Diagnosis not present

## 2020-05-16 DIAGNOSIS — M546 Pain in thoracic spine: Secondary | ICD-10-CM | POA: Diagnosis not present

## 2020-05-16 DIAGNOSIS — M25551 Pain in right hip: Secondary | ICD-10-CM | POA: Diagnosis not present

## 2020-05-16 DIAGNOSIS — M9901 Segmental and somatic dysfunction of cervical region: Secondary | ICD-10-CM | POA: Diagnosis not present

## 2020-05-16 DIAGNOSIS — M5413 Radiculopathy, cervicothoracic region: Secondary | ICD-10-CM | POA: Diagnosis not present

## 2020-05-16 DIAGNOSIS — M545 Low back pain: Secondary | ICD-10-CM | POA: Diagnosis not present

## 2020-05-16 DIAGNOSIS — M9903 Segmental and somatic dysfunction of lumbar region: Secondary | ICD-10-CM | POA: Diagnosis not present

## 2020-05-16 DIAGNOSIS — M9902 Segmental and somatic dysfunction of thoracic region: Secondary | ICD-10-CM | POA: Diagnosis not present

## 2020-05-16 DIAGNOSIS — M9905 Segmental and somatic dysfunction of pelvic region: Secondary | ICD-10-CM | POA: Diagnosis not present

## 2020-05-18 DIAGNOSIS — M5413 Radiculopathy, cervicothoracic region: Secondary | ICD-10-CM | POA: Diagnosis not present

## 2020-05-18 DIAGNOSIS — M546 Pain in thoracic spine: Secondary | ICD-10-CM | POA: Diagnosis not present

## 2020-05-18 DIAGNOSIS — M9903 Segmental and somatic dysfunction of lumbar region: Secondary | ICD-10-CM | POA: Diagnosis not present

## 2020-05-18 DIAGNOSIS — M9901 Segmental and somatic dysfunction of cervical region: Secondary | ICD-10-CM | POA: Diagnosis not present

## 2020-05-18 DIAGNOSIS — M9905 Segmental and somatic dysfunction of pelvic region: Secondary | ICD-10-CM | POA: Diagnosis not present

## 2020-05-18 DIAGNOSIS — M25551 Pain in right hip: Secondary | ICD-10-CM | POA: Diagnosis not present

## 2020-05-18 DIAGNOSIS — M9902 Segmental and somatic dysfunction of thoracic region: Secondary | ICD-10-CM | POA: Diagnosis not present

## 2020-05-18 DIAGNOSIS — M545 Low back pain: Secondary | ICD-10-CM | POA: Diagnosis not present

## 2020-05-24 DIAGNOSIS — M25512 Pain in left shoulder: Secondary | ICD-10-CM | POA: Diagnosis not present

## 2020-05-24 DIAGNOSIS — M25551 Pain in right hip: Secondary | ICD-10-CM | POA: Diagnosis not present

## 2020-05-30 DIAGNOSIS — Z01812 Encounter for preprocedural laboratory examination: Secondary | ICD-10-CM | POA: Diagnosis not present

## 2020-05-30 DIAGNOSIS — M25551 Pain in right hip: Secondary | ICD-10-CM | POA: Diagnosis not present

## 2020-06-08 DIAGNOSIS — Z01812 Encounter for preprocedural laboratory examination: Secondary | ICD-10-CM | POA: Diagnosis not present

## 2020-06-08 DIAGNOSIS — M25551 Pain in right hip: Secondary | ICD-10-CM | POA: Diagnosis not present

## 2020-06-10 DIAGNOSIS — R7303 Prediabetes: Secondary | ICD-10-CM | POA: Diagnosis not present

## 2020-06-10 DIAGNOSIS — N183 Chronic kidney disease, stage 3 unspecified: Secondary | ICD-10-CM | POA: Diagnosis not present

## 2020-06-13 DIAGNOSIS — M205X9 Other deformities of toe(s) (acquired), unspecified foot: Secondary | ICD-10-CM | POA: Diagnosis not present

## 2020-06-13 DIAGNOSIS — Z4889 Encounter for other specified surgical aftercare: Secondary | ICD-10-CM | POA: Diagnosis not present

## 2020-06-13 DIAGNOSIS — M2022 Hallux rigidus, left foot: Secondary | ICD-10-CM | POA: Diagnosis not present

## 2020-06-13 DIAGNOSIS — M21619 Bunion of unspecified foot: Secondary | ICD-10-CM | POA: Diagnosis not present

## 2020-06-13 DIAGNOSIS — M79672 Pain in left foot: Secondary | ICD-10-CM | POA: Diagnosis not present

## 2020-06-16 ENCOUNTER — Other Ambulatory Visit: Payer: Self-pay | Admitting: Gastroenterology

## 2020-06-16 ENCOUNTER — Other Ambulatory Visit (HOSPITAL_COMMUNITY)
Admission: AD | Admit: 2020-06-16 | Discharge: 2020-06-16 | Disposition: A | Discharge status: Home / Self Care | Payer: PPO | Source: Ambulatory Visit | Attending: Ophthalmology | Admitting: Ophthalmology

## 2020-06-16 DIAGNOSIS — Z01812 Encounter for preprocedural laboratory examination: Secondary | ICD-10-CM | POA: Insufficient documentation

## 2020-06-16 DIAGNOSIS — R519 Headache, unspecified: Secondary | ICD-10-CM | POA: Diagnosis not present

## 2020-06-16 LAB — CBC WITH DIFFERENTIAL/PLATELET
Abs Immature Granulocytes: 0.03 10*3/uL (ref 0.00–0.07)
Basophils Absolute: 0.1 10*3/uL (ref 0.0–0.1)
Basophils Relative: 1 %
Eosinophils Absolute: 0.3 10*3/uL (ref 0.0–0.5)
Eosinophils Relative: 4 %
HCT: 43.6 % (ref 36.0–46.0)
Hemoglobin: 13.3 g/dL (ref 12.0–15.0)
Immature Granulocytes: 0 %
Lymphocytes Relative: 17 %
Lymphs Abs: 1.3 10*3/uL (ref 0.7–4.0)
MCH: 29.4 pg (ref 26.0–34.0)
MCHC: 30.5 g/dL (ref 30.0–36.0)
MCV: 96.2 fL (ref 80.0–100.0)
Monocytes Absolute: 0.7 10*3/uL (ref 0.1–1.0)
Monocytes Relative: 9 %
Neutro Abs: 5.2 10*3/uL (ref 1.7–7.7)
Neutrophils Relative %: 69 %
Platelets: 304 10*3/uL (ref 150–400)
RBC: 4.53 MIL/uL (ref 3.87–5.11)
RDW: 14.6 % (ref 11.5–15.5)
WBC: 7.5 10*3/uL (ref 4.0–10.5)
nRBC: 0 % (ref 0.0–0.2)

## 2020-06-16 LAB — SEDIMENTATION RATE: Sed Rate: 2 mm/hr (ref 0–22)

## 2020-06-16 LAB — C-REACTIVE PROTEIN: CRP: 0.5 mg/dL (ref <1.0)

## 2020-06-17 ENCOUNTER — Telehealth: Payer: Self-pay

## 2020-06-17 DIAGNOSIS — M25512 Pain in left shoulder: Secondary | ICD-10-CM | POA: Diagnosis not present

## 2020-06-17 MED ORDER — LANSOPRAZOLE 30 MG PO CPDR: 30.0000 mg | DELAYED_RELEASE_CAPSULE | Freq: Two times a day (BID) | ORAL | 3 refills | Status: DC

## 2020-06-17 NOTE — Telephone Encounter (Signed)
rx completed

## 2020-06-17 NOTE — Telephone Encounter (Signed)
Spoke with pt. She is taking the generic prevacid. Aliene Altes, PA changed pt to this med at her last ov.

## 2020-06-17 NOTE — Telephone Encounter (Signed)
Correction, routing to Neil Crouch, PA.

## 2020-06-17 NOTE — Telephone Encounter (Signed)
Received a call from [facility]. They would like to request a refill on the generic Prevacid 30 mg bid for pt.

## 2020-06-17 NOTE — Telephone Encounter (Signed)
Can we make sure she is not on Dexilant? That is what our records indicate. If not, I will send in the lansoprazole.

## 2020-06-17 NOTE — Addendum Note (Signed)
Addended by: Mahala Menghini on: 06/17/2020 12:37 PM   Modules accepted: Orders

## 2020-06-23 ENCOUNTER — Telehealth: Payer: Self-pay | Admitting: *Deleted

## 2020-06-23 ENCOUNTER — Other Ambulatory Visit: Payer: Self-pay

## 2020-06-23 ENCOUNTER — Telehealth (INDEPENDENT_AMBULATORY_CARE_PROVIDER_SITE_OTHER): Payer: PPO | Admitting: Gastroenterology

## 2020-06-23 ENCOUNTER — Ambulatory Visit: Payer: Self-pay | Admitting: Physician Assistant

## 2020-06-23 ENCOUNTER — Encounter: Payer: Self-pay | Admitting: Gastroenterology

## 2020-06-23 DIAGNOSIS — R0989 Other specified symptoms and signs involving the circulatory and respiratory systems: Secondary | ICD-10-CM

## 2020-06-23 DIAGNOSIS — R198 Other specified symptoms and signs involving the digestive system and abdomen: Secondary | ICD-10-CM

## 2020-06-23 MED ORDER — PANTOPRAZOLE SODIUM 40 MG PO TBEC: 40.0000 mg | DELAYED_RELEASE_TABLET | Freq: Two times a day (BID) | ORAL | 3 refills | Status: DC

## 2020-06-23 NOTE — Telephone Encounter (Signed)
[name], you are scheduled for a virtual visit with your provider today.  Just as we do with appointments in the office, we must obtain your consent to participate.  Your consent will be active for this visit and any virtual visit you may have with one of our providers in the next 365 days.  If you have a MyChart account, I can also send a copy of this consent to you electronically.  All virtual visits are billed to your insurance company just like a traditional visit in the office.  As this is a virtual visit, video technology does not allow for your provider to perform a traditional examination.  This may limit your provider's ability to fully assess your condition.  If your provider identifies any concerns that need to be evaluated in person or the need to arrange testing such as labs, EKG, etc, we will make arrangements to do so.  Although advances in technology are sophisticated, we cannot ensure that it will always work on either your end or our end.  If the connection with a video visit is poor, we may have to switch to a telephone visit.  With either a video or telephone visit, we are not always able to ensure that we have a secure connection.   I need to obtain your verbal consent now.   Are you willing to proceed with your visit today?

## 2020-06-23 NOTE — Telephone Encounter (Signed)
Pt consented to a virtual visit. 

## 2020-06-23 NOTE — Progress Notes (Signed)
Primary Care Physician:  Asencion Noble, MD  Primary GI: Dr. Abbey Chatters   Patient Location: Home   Provider Location: [facility] office   Reason for Visit:    Persons present on the virtual encounter, with roles:    Total time (minutes) spent on medical discussion: 15 minutes   Due to COVID-19, visit was conducted using virtual method.  Visit was requested by patient.  Virtual Visit via MyChart Video Note Due to COVID-19, visit is conducted virtually and was requested by patient.   I connected with [name] on 06/23/20 at 10:30 AM EDT by telephone and verified that I am speaking with the correct person using two identifiers.   I discussed the limitations, risks, security and privacy concerns of performing an evaluation and management service by telephone and the availability of in person appointments. I also discussed with the patient that there may be a patient responsible charge related to this service. The patient expressed understanding and agreed to proceed.  Chief Complaint  Patient presents with  . globus sensation    continue feeling     History of Present Illness: 64 year old female presenting with history of GERD, chronic globus sensation, EGD in July 2020 with non-obstructing Schatzki's ring s/p dilation, moderate sized hiatal hernia, retained food contents, Jan 2021 with low-grade Schatzki's ring s/p dilaiton, reflux esophagitis, negative esophageal biopsies. GES normal. Declined to see ENT again. She last saw them in 2019 Wanted to hold off on surgical referral in the past as she was helping with her ex-husband who had stage IV cancer.   Recommendations for surgical referral if persistent symptoms due to refractory GERD.   Ex-husband passed away 01-18-23.   Food doesn't hang up. Always feels a furball but coughs and clears it. Has hip replacement on 11/19. Prevacid BID. Feels like a lot of this is stress. Hungry all the time. Feels bloated. Pigs out on last meal of  day. 2 scoops of baking soda and ginger ale in evening. Mucinex BID. Has seen the allergist as well. No dairy products.   Past Medical History:  Diagnosis Date  . Abnormal Pap smear of cervix   . Anxiety   . Asthma   . Depression   . Essential hypertension   . Fibromyalgia   . GERD (gastroesophageal reflux disease)   . History of kidney stones   . History of stroke   . PVC's (premature ventricular contractions)   . Sciatica of right side      Past Surgical History:  Procedure Laterality Date  . ADENOIDECTOMY    . APPENDECTOMY    . BUNIONECTOMY Bilateral   . CHONDROPLASTY Right 06/19/2018   Procedure: KNEE ARTHROSCOPY WITH chondroplasty;  Surgeon: Carole Civil, MD;  Location: [facility];  Service: Orthopedics;  Laterality: Right;  . COLONOSCOPY  10/08/2011   diverticulosis, hyperplastic polyp removed. next tcs 2023.  Marland Kitchen ESOPHAGOGASTRODUODENOSCOPY N/A 03/12/2019   (EGD) with pyloric dilation;  Surgeon: Danie Binder, MD; nonobstructing Schatzki ring s/p dilation, moderate sized hiatal hernia, mild gastritis s/p biopsy, single gastric polyp s/p biopsy, possible gastroparesis with retained gastric contents.  Pathology benign with no H. pylori.  . ESOPHAGOGASTRODUODENOSCOPY (EGD) WITH PROPOFOL N/A 08/24/2019   normal stomach, low-grade Schatzki ring s/p dilation, LA Grade A reflux esophagitis, s/p biopsy. Normal esophageal biopsies  . FOOT SURGERY  08/2018  . FOOT SURGERY  05/14/2019  . KNEE SURGERY  05/2018  . POLYPECTOMY  03/12/2019   Procedure: POLYPECTOMY;  Surgeon: Barney Drain  L, MD;  Location: [facility];  Service: Endoscopy;;  . TONSILLECTOMY    . TUBAL LIGATION       Current Meds  Medication Sig  . ALPRAZolam (XANAX) 1 MG tablet Take 1 mg by mouth 4 (four) times daily as needed for anxiety.   Marland Kitchen amLODipine (NORVASC) 5 MG tablet Take 5 mg by mouth daily.  . budesonide-formoterol (SYMBICORT) 160-4.5 MCG/ACT inhaler Inhale 2 puffs into the lungs 2 (two) times daily.    . diphenhydrAMINE (BENADRYL) 25 mg capsule Take 25 mg by mouth every 8 (eight) hours as needed for itching or allergies.   Marland Kitchen EPINEPHrine (EPIPEN 2-PAK) 0.3 mg/0.3 mL IJ SOAJ injection Inject 0.3 mg into the muscle as needed for anaphylaxis.  . furosemide (LASIX) 40 MG tablet Take 40 mg by mouth daily as needed for fluid.  Marland Kitchen guaiFENesin (MUCINEX) 600 MG 12 hr tablet Take 600 mg by mouth 2 (two) times daily.  . lansoprazole (PREVACID) 30 MG capsule Take 1 capsule (30 mg total) by mouth 2 (two) times daily before a meal.  . methocarbamol (ROBAXIN) 500 MG tablet Take 500 mg by mouth 2 (two) times daily.  . phentermine (ADIPEX-P) 37.5 MG tablet TAKE ONE-HALF TO ONE TABLET EVERY 3 DAYSFOR FATIGUE (Patient taking differently: TAKE ONE-HALF TO ONE TABLET EVERY 3 DAYSFOR FATIGUE as needed)  . PROAIR HFA 108 (90 Base) MCG/ACT inhaler Inhale 2 puffs into the lungs as needed for wheezing or shortness of breath.   . topiramate (TOPAMAX) 200 MG tablet Take 200 mg by mouth 2 (two) times daily.      Family History  Problem Relation Age of Onset  . Hypertension Mother   . Diverticulosis Mother   . Hypertension Father   . AAA (abdominal aortic aneurysm) Father   . Cancer Brother        not sure where primary, was metastatic. was jaundice.   Marland Kitchen COPD Brother   . Angioedema Neg Hx   . Atopy Neg Hx   . Eczema Neg Hx   . Immunodeficiency Neg Hx   . Urticaria Neg Hx   . Asthma Neg Hx   . Allergic rhinitis Neg Hx   . Colon cancer Neg Hx     Social History   Socioeconomic History  . Marital status: Married    Spouse name: Not on file  . Number of children: Not on file  . Years of education: Not on file  . Highest education level: Not on file  Occupational History  . Not on file  Tobacco Use  . Smoking status: Former Smoker    Packs/day: 0.25    Years: 1.00    Pack years: 0.25    Types: Cigarettes    Quit date: 06/17/1975    Years since quitting: 45.0  . Smokeless tobacco: Never Used  Vaping  Use  . Vaping Use: Never used  Substance and Sexual Activity  . Alcohol use: No  . Drug use: No  . Sexual activity: Yes    Birth control/protection: Surgical, Post-menopausal    Comment: tubal  Other Topics Concern  . Not on file  Social History Narrative  . Not on file   Social Determinants of Health   Financial Resource Strain:   . Difficulty of Paying Living Expenses: Not on file  Food Insecurity:   . Worried About Charity fundraiser in the Last Year: Not on file  . Ran Out of Food in the Last Year: Not on file  Transportation  Needs:   . Lack of Transportation (Medical): Not on file  . Lack of Transportation (Non-Medical): Not on file  Physical Activity:   . Days of Exercise per Week: Not on file  . Minutes of Exercise per Session: Not on file  Stress:   . Feeling of Stress : Not on file  Social Connections:   . Frequency of Communication with Friends and Family: Not on file  . Frequency of Social Gatherings with Friends and Family: Not on file  . Attends Religious Services: Not on file  . Active Member of Clubs or Organizations: Not on file  . Attends Archivist Meetings: Not on file  . Marital Status: Not on file       Review of Systems: Gen: Denies fever, chills, anorexia. Denies fatigue, weakness, weight loss.  CV: Denies chest pain, palpitations, syncope, peripheral edema, and claudication. Resp: Denies dyspnea at rest, cough, wheezing, coughing up blood, and pleurisy. GI: see HPI Derm: Denies rash, itching, dry skin Psych: Denies depression, anxiety, memory loss, confusion. No homicidal or suicidal ideation.  Heme: Denies bruising, bleeding, and enlarged lymph nodes.  Observations/Objective: No distress. Pleasant and cooperative on video.   Assessment and Plan: Pleasant 64 year old female with chronic globus sensation, throat clearing, declining to see ENT again (last in 2019), previously has had allergy work-up, and EGD recently on file Jan  2021. She has been on Prevacid BID chronically. Will change to Protonix BID. Discussion of reflux surgery has been brought up historically but not pursued in light of family member's health issues and personal health issues. Now with upcoming hip surgery. I feel she would benefit from ENT evaluation again. May need pH/manometry study. I am requesting that she call with update on change to Protonix in next 3-4 weeks and return to see Dr. Abbey Chatters in 3 months.   Follow Up Instructions: See AVS   I discussed the assessment and treatment plan with the patient. The patient was provided an opportunity to ask questions and all were answered. The patient agreed with the plan and demonstrated an understanding of the instructions.   The patient was advised to call back or seek an in-person evaluation if the symptoms worsen or if the condition fails to improve as anticipated.  I provided 15 minutes of non-face-to-face time during this encounter.  Annitta Needs, PhD, ANP-BC [facility]

## 2020-06-23 NOTE — H&P (Signed)
TOTAL HIP ADMISSION H&P  Patient is admitted for right total hip arthroplasty.  Subjective:  Chief Complaint: right hip pain  HPI: LAPORSHIA HOGEN, 64 y.o. female, has a history of pain and functional disability in the right hip(s) due to arthritis and patient has failed non-surgical conservative treatments for greater than 12 weeks to include NSAID's and/or analgesics, corticosteriod injections and activity modification.  Onset of symptoms was gradual starting 6 years ago with gradually worsening course since that time.The patient noted no past surgery on the right hip(s).  Patient currently rates pain in the right hip at 8 out of 10 with activity. Patient has night pain, worsening of pain with activity and weight bearing, pain that interfers with activities of daily living and pain with passive range of motion. Patient has evidence of subchondral cysts, periarticular osteophytes and joint space narrowing by imaging studies. This condition presents safety issues increasing the risk of falls. There is no current active infection.  Patient Active Problem List   Diagnosis Date Noted  . Globus sensation 12/22/2019  . Dyspepsia   . Acquired pyloric stenosis   . S/P right knee arthroscopy 06/19/18 06/26/2018  . Chondromalacia of medial femoral condyle, right   . Primary osteoarthritis of right knee   . Allergic reaction 09/16/2017  . Allergic urticaria 09/16/2017  . Angioedema 09/16/2017  . Allergic rhinitis 09/16/2017  . Generalized anxiety disorder 12/03/2016  . Depression 12/03/2016  . GERD (gastroesophageal reflux disease) 12/03/2016  . Personal history of stroke with residual effects 12/03/2016  . Moderate persistent asthma 12/02/2016  . Fibromyalgia   . Adhesive capsulitis of right shoulder 08/31/2013  . Adhesive capsulitis 07/29/2013  . Rotator cuff syndrome of left shoulder 07/29/2013  . Low back pain 12/01/2012  . BUNION 02/23/2008   Past Medical History:  Diagnosis Date   . Abnormal Pap smear of cervix   . Anxiety   . Asthma   . Depression   . Essential hypertension   . Fibromyalgia   . GERD (gastroesophageal reflux disease)   . History of kidney stones   . History of stroke   . PVC's (premature ventricular contractions)   . Sciatica of right side     Past Surgical History:  Procedure Laterality Date  . ADENOIDECTOMY    . APPENDECTOMY    . BUNIONECTOMY Bilateral   . CHONDROPLASTY Right 06/19/2018   Procedure: KNEE ARTHROSCOPY WITH chondroplasty;  Surgeon: Carole Civil, MD;  Location: [facility];  Service: Orthopedics;  Laterality: Right;  . COLONOSCOPY  10/08/2011   diverticulosis, hyperplastic polyp removed. next tcs 2023.  Marland Kitchen ESOPHAGOGASTRODUODENOSCOPY N/A 03/12/2019   (EGD) with pyloric dilation;  Surgeon: Danie Binder, MD; nonobstructing Schatzki ring s/p dilation, moderate sized hiatal hernia, mild gastritis s/p biopsy, single gastric polyp s/p biopsy, possible gastroparesis with retained gastric contents.  Pathology benign with no H. pylori.  . ESOPHAGOGASTRODUODENOSCOPY (EGD) WITH PROPOFOL N/A 08/24/2019   normal stomach, low-grade Schatzki ring s/p dilation, LA Grade A reflux esophagitis, s/p biopsy. Normal esophageal biopsies  . FOOT SURGERY  08/2018  . FOOT SURGERY  05/14/2019  . KNEE SURGERY  05/2018  . POLYPECTOMY  03/12/2019   Procedure: POLYPECTOMY;  Surgeon: Danie Binder, MD;  Location: [facility];  Service: Endoscopy;;  . TONSILLECTOMY    . TUBAL LIGATION      Current Outpatient Medications  Medication Sig Dispense Refill Last Dose  . ALPRAZolam (XANAX) 1 MG tablet Take 1 mg by mouth 4 (four) times daily as  needed for anxiety.   0   . amLODipine (NORVASC) 5 MG tablet Take 5 mg by mouth daily.  0   . budesonide-formoterol (SYMBICORT) 160-4.5 MCG/ACT inhaler Inhale 2 puffs into the lungs 2 (two) times daily.     . diphenhydrAMINE (BENADRYL) 25 mg capsule Take 25 mg by mouth every 8 (eight) hours as needed for itching or  allergies.      Marland Kitchen EPINEPHrine (EPIPEN 2-PAK) 0.3 mg/0.3 mL IJ SOAJ injection Inject 0.3 mg into the muscle as needed for anaphylaxis.     . furosemide (LASIX) 40 MG tablet Take 40 mg by mouth daily as needed for fluid.     Marland Kitchen guaiFENesin (MUCINEX) 600 MG 12 hr tablet Take 1,200 mg by mouth 2 (two) times daily.      . lansoprazole (PREVACID) 30 MG capsule Take 1 capsule (30 mg total) by mouth 2 (two) times daily before a meal. (Patient not taking: Reported on 06/23/2020) 180 capsule 3   . meloxicam (MOBIC) 15 MG tablet Take 15 mg by mouth daily.     . methocarbamol (ROBAXIN) 750 MG tablet Take 750 mg by mouth every 6 (six) hours as needed for muscle spasms.      . pantoprazole (PROTONIX) 40 MG tablet Take 1 tablet (40 mg total) by mouth 2 (two) times daily before a meal. 60 tablet 3   . PARoxetine (PAXIL) 20 MG tablet Take 20 mg by mouth at bedtime.     . phentermine (ADIPEX-P) 37.5 MG tablet TAKE ONE-HALF TO ONE TABLET EVERY 3 DAYSFOR FATIGUE (Patient not taking: Reported on 06/23/2020) 30 tablet 1   . PROAIR HFA 108 (90 Base) MCG/ACT inhaler Inhale 2 puffs into the lungs as needed for wheezing or shortness of breath.   1   . topiramate (TOPAMAX) 200 MG tablet Take 200 mg by mouth 2 (two) times daily.       No current facility-administered medications for this visit.   Allergies  Allergen Reactions  . Lidocaine Rash and Other (See Comments)    Required ED visit; 12/22/19 stated she isn't allergic to lidocaine anymore  . Other Other (See Comments)    Unknown what is causing her to have allergic reactions.  Reaction started while at dentist receiving lidocaine 2% with epi.    Social History   Tobacco Use  . Smoking status: Former Smoker    Packs/day: 0.25    Years: 1.00    Pack years: 0.25    Types: Cigarettes    Quit date: 06/17/1975    Years since quitting: 45.0  . Smokeless tobacco: Never Used  Substance Use Topics  . Alcohol use: No    Family History  Problem Relation Age of Onset   . Hypertension Mother   . Diverticulosis Mother   . Hypertension Father   . AAA (abdominal aortic aneurysm) Father   . Cancer Brother        not sure where primary, was metastatic. was jaundice.   Marland Kitchen COPD Brother   . Angioedema Neg Hx   . Atopy Neg Hx   . Eczema Neg Hx   . Immunodeficiency Neg Hx   . Urticaria Neg Hx   . Asthma Neg Hx   . Allergic rhinitis Neg Hx   . Colon cancer Neg Hx      Review of Systems  HENT: Positive for nosebleeds.   Gastrointestinal: Positive for diarrhea.  Musculoskeletal: Positive for arthralgias.  Neurological: Positive for headaches.  All other systems reviewed and are  negative.   Objective:  Physical Exam Constitutional:      General: She is not in acute distress.    Appearance: Normal appearance.  HENT:     Head: Normocephalic and atraumatic.  Eyes:     Extraocular Movements: Extraocular movements intact.     Pupils: Pupils are equal, round, and reactive to light.  Cardiovascular:     Rate and Rhythm: Normal rate and regular rhythm.     Pulses: Normal pulses.     Heart sounds: Normal heart sounds.  Pulmonary:     Effort: Pulmonary effort is normal.     Breath sounds: Normal breath sounds.  Abdominal:     General: Abdomen is flat. Bowel sounds are normal.     Palpations: Abdomen is soft.  Musculoskeletal:     Cervical back: Normal range of motion and neck supple.     Right hip: Tenderness and bony tenderness present. Decreased range of motion. Decreased strength.  Lymphadenopathy:     Cervical: No cervical adenopathy.  Skin:    General: Skin is warm and dry.     Findings: No erythema or rash.  Neurological:     General: No focal deficit present.     Mental Status: She is alert and oriented to person, place, and time.  Psychiatric:        Mood and Affect: Mood normal.        Behavior: Behavior normal.     Vital signs in last 24 hours: @VSRANGES @  Labs:   Estimated body mass index is 27.67 kg/m as calculated from the  following:   Height as of 08/20/19: 5\' 8"  (1.727 m).   Weight as of 08/20/19: 82.6 kg.   Imaging Review Plain radiographs demonstrate moderate degenerative joint disease of the right hip(s). The bone quality appears to be good for age and reported activity level.      Assessment/Plan:  End stage arthritis, right hip(s)  The patient history, physical examination, clinical judgement of the provider and imaging studies are consistent with end stage degenerative joint disease of the right hip(s) and total hip arthroplasty is deemed medically necessary. The treatment options including medical management, injection therapy, arthroscopy and arthroplasty were discussed at length. The risks and benefits of total hip arthroplasty were presented and reviewed. The risks due to aseptic loosening, infection, stiffness, dislocation/subluxation,  thromboembolic complications and other imponderables were discussed.  The patient acknowledged the explanation, agreed to proceed with the plan and consent was signed. Patient is being admitted for inpatient treatment for surgery, pain control, PT, OT, prophylactic antibiotics, VTE prophylaxis, progressive ambulation and ADL's and discharge planning.The patient is planning to be discharged home with outpt PT   Anticipated LOS equal to or greater than 2 midnights due to - Age 34 and older with one or more of the following:  - Obesity  - Expected need for hospital services (PT, OT, Nursing) required for safe  discharge  - Anticipated need for postoperative skilled nursing care or inpatient rehab  - Active co-morbidities: Stroke OR   - Unanticipated findings during/Post Surgery: None  - Patient is a high risk of re-admission due to: None

## 2020-06-23 NOTE — Patient Instructions (Addendum)
Please call in about 3-4 weeks with an update of how you are doing.  Stop Prevacid. Let's try Protonix twice a day, 30 minutes before breakfast and dinner.  We will see you back in 3 months!  I enjoyed seeing you again today! As you know, I value our relationship and want to provide genuine, compassionate, and quality care. I welcome your feedback. If you receive a survey regarding your visit,  I greatly appreciate you taking time to fill this out. See you next time!  Annitta Needs, PhD, ANP-BC [facility]

## 2020-06-23 NOTE — H&P (View-Only) (Signed)
TOTAL HIP ADMISSION H&P  Patient is admitted for right total hip arthroplasty.  Subjective:  Chief Complaint: right hip pain  HPI: [name], 64 y.o. female, has a history of pain and functional disability in the right hip(s) due to arthritis and patient has failed non-surgical conservative treatments for greater than 12 weeks to include NSAID's and/or analgesics, corticosteriod injections and activity modification.  Onset of symptoms was gradual starting 6 years ago with gradually worsening course since that time.The patient noted no past surgery on the right hip(s).  Patient currently rates pain in the right hip at 8 out of 10 with activity. Patient has night pain, worsening of pain with activity and weight bearing, pain that interfers with activities of daily living and pain with passive range of motion. Patient has evidence of subchondral cysts, periarticular osteophytes and joint space narrowing by imaging studies. This condition presents safety issues increasing the risk of falls. There is no current active infection.  Patient Active Problem List   Diagnosis Date Noted  . Globus sensation 12/22/2019  . Dyspepsia   . Acquired pyloric stenosis   . S/P right knee arthroscopy 06/19/18 06/26/2018  . Chondromalacia of medial femoral condyle, right   . Primary osteoarthritis of right knee   . Allergic reaction 09/16/2017  . Allergic urticaria 09/16/2017  . Angioedema 09/16/2017  . Allergic rhinitis 09/16/2017  . Generalized anxiety disorder 12/03/2016  . Depression 12/03/2016  . GERD (gastroesophageal reflux disease) 12/03/2016  . Personal history of stroke with residual effects 12/03/2016  . Moderate persistent asthma 12/02/2016  . Fibromyalgia   . Adhesive capsulitis of right shoulder 08/31/2013  . Adhesive capsulitis 07/29/2013  . Rotator cuff syndrome of left shoulder 07/29/2013  . Low back pain 12/01/2012  . BUNION 02/23/2008   Past Medical History:  Diagnosis Date   . Abnormal Pap smear of cervix   . Anxiety   . Asthma   . Depression   . Essential hypertension   . Fibromyalgia   . GERD (gastroesophageal reflux disease)   . History of kidney stones   . History of stroke   . PVC's (premature ventricular contractions)   . Sciatica of right side     Past Surgical History:  Procedure Laterality Date  . ADENOIDECTOMY    . APPENDECTOMY    . BUNIONECTOMY Bilateral   . CHONDROPLASTY Right 06/19/2018   Procedure: KNEE ARTHROSCOPY WITH chondroplasty;  Surgeon: Carole Civil, MD;  Location: [facility];  Service: Orthopedics;  Laterality: Right;  . COLONOSCOPY  10/08/2011   diverticulosis, hyperplastic polyp removed. next tcs 2023.  Marland Kitchen ESOPHAGOGASTRODUODENOSCOPY N/A 03/12/2019   (EGD) with pyloric dilation;  Surgeon: Danie Binder, MD; nonobstructing Schatzki ring s/p dilation, moderate sized hiatal hernia, mild gastritis s/p biopsy, single gastric polyp s/p biopsy, possible gastroparesis with retained gastric contents.  Pathology benign with no H. pylori.  . ESOPHAGOGASTRODUODENOSCOPY (EGD) WITH PROPOFOL N/A 08/24/2019   normal stomach, low-grade Schatzki ring s/p dilation, LA Grade A reflux esophagitis, s/p biopsy. Normal esophageal biopsies  . FOOT SURGERY  08/2018  . FOOT SURGERY  05/14/2019  . KNEE SURGERY  05/2018  . POLYPECTOMY  03/12/2019   Procedure: POLYPECTOMY;  Surgeon: Danie Binder, MD;  Location: [facility];  Service: Endoscopy;;  . TONSILLECTOMY    . TUBAL LIGATION      Current Outpatient Medications  Medication Sig Dispense Refill Last Dose  . ALPRAZolam (XANAX) 1 MG tablet Take 1 mg by mouth 4 (four) times daily as  needed for anxiety.   0   . amLODipine (NORVASC) 5 MG tablet Take 5 mg by mouth daily.  0   . budesonide-formoterol (SYMBICORT) 160-4.5 MCG/ACT inhaler Inhale 2 puffs into the lungs 2 (two) times daily.     . diphenhydrAMINE (BENADRYL) 25 mg capsule Take 25 mg by mouth every 8 (eight) hours as needed for itching or  allergies.      Marland Kitchen EPINEPHrine (EPIPEN 2-PAK) 0.3 mg/0.3 mL IJ SOAJ injection Inject 0.3 mg into the muscle as needed for anaphylaxis.     . furosemide (LASIX) 40 MG tablet Take 40 mg by mouth daily as needed for fluid.     Marland Kitchen guaiFENesin (MUCINEX) 600 MG 12 hr tablet Take 1,200 mg by mouth 2 (two) times daily.      . lansoprazole (PREVACID) 30 MG capsule Take 1 capsule (30 mg total) by mouth 2 (two) times daily before a meal. (Patient not taking: Reported on 06/23/2020) 180 capsule 3   . meloxicam (MOBIC) 15 MG tablet Take 15 mg by mouth daily.     . methocarbamol (ROBAXIN) 750 MG tablet Take 750 mg by mouth every 6 (six) hours as needed for muscle spasms.      . pantoprazole (PROTONIX) 40 MG tablet Take 1 tablet (40 mg total) by mouth 2 (two) times daily before a meal. 60 tablet 3   . PARoxetine (PAXIL) 20 MG tablet Take 20 mg by mouth at bedtime.     . phentermine (ADIPEX-P) 37.5 MG tablet TAKE ONE-HALF TO ONE TABLET EVERY 3 DAYSFOR FATIGUE (Patient not taking: Reported on 06/23/2020) 30 tablet 1   . PROAIR HFA 108 (90 Base) MCG/ACT inhaler Inhale 2 puffs into the lungs as needed for wheezing or shortness of breath.   1   . topiramate (TOPAMAX) 200 MG tablet Take 200 mg by mouth 2 (two) times daily.       No current facility-administered medications for this visit.   Allergies  Allergen Reactions  . Lidocaine Rash and Other (See Comments)    Required ED visit; 12/22/19 stated she isn't allergic to lidocaine anymore  . Other Other (See Comments)    Unknown what is causing her to have allergic reactions.  Reaction started while at dentist receiving lidocaine 2% with epi.    Social History   Tobacco Use  . Smoking status: Former Smoker    Packs/day: 0.25    Years: 1.00    Pack years: 0.25    Types: Cigarettes    Quit date: 06/17/1975    Years since quitting: 45.0  . Smokeless tobacco: Never Used  Substance Use Topics  . Alcohol use: No    Family History  Problem Relation Age of Onset   . Hypertension Mother   . Diverticulosis Mother   . Hypertension Father   . AAA (abdominal aortic aneurysm) Father   . Cancer Brother        not sure where primary, was metastatic. was jaundice.   Marland Kitchen COPD Brother   . Angioedema Neg Hx   . Atopy Neg Hx   . Eczema Neg Hx   . Immunodeficiency Neg Hx   . Urticaria Neg Hx   . Asthma Neg Hx   . Allergic rhinitis Neg Hx   . Colon cancer Neg Hx      Review of Systems  HENT: Positive for nosebleeds.   Gastrointestinal: Positive for diarrhea.  Musculoskeletal: Positive for arthralgias.  Neurological: Positive for headaches.  All other systems reviewed and are  negative.   Objective:  Physical Exam Constitutional:      General: She is not in acute distress.    Appearance: Normal appearance.  HENT:     Head: Normocephalic and atraumatic.  Eyes:     Extraocular Movements: Extraocular movements intact.     Pupils: Pupils are equal, round, and reactive to light.  Cardiovascular:     Rate and Rhythm: Normal rate and regular rhythm.     Pulses: Normal pulses.     Heart sounds: Normal heart sounds.  Pulmonary:     Effort: Pulmonary effort is normal.     Breath sounds: Normal breath sounds.  Abdominal:     General: Abdomen is flat. Bowel sounds are normal.     Palpations: Abdomen is soft.  Musculoskeletal:     Cervical back: Normal range of motion and neck supple.     Right hip: Tenderness and bony tenderness present. Decreased range of motion. Decreased strength.  Lymphadenopathy:     Cervical: No cervical adenopathy.  Skin:    General: Skin is warm and dry.     Findings: No erythema or rash.  Neurological:     General: No focal deficit present.     Mental Status: She is alert and oriented to person, place, and time.  Psychiatric:        Mood and Affect: Mood normal.        Behavior: Behavior normal.     Vital signs in last 24 hours: @VSRANGES @  Labs:   Estimated body mass index is 27.67 kg/m as calculated from the  following:   Height as of 08/20/19: 5\' 8"  (1.727 m).   Weight as of 08/20/19: 82.6 kg.   Imaging Review Plain radiographs demonstrate moderate degenerative joint disease of the right hip(s). The bone quality appears to be good for age and reported activity level.      Assessment/Plan:  End stage arthritis, right hip(s)  The patient history, physical examination, clinical judgement of the provider and imaging studies are consistent with end stage degenerative joint disease of the right hip(s) and total hip arthroplasty is deemed medically necessary. The treatment options including medical management, injection therapy, arthroscopy and arthroplasty were discussed at length. The risks and benefits of total hip arthroplasty were presented and reviewed. The risks due to aseptic loosening, infection, stiffness, dislocation/subluxation,  thromboembolic complications and other imponderables were discussed.  The patient acknowledged the explanation, agreed to proceed with the plan and consent was signed. Patient is being admitted for inpatient treatment for surgery, pain control, PT, OT, prophylactic antibiotics, VTE prophylaxis, progressive ambulation and ADL's and discharge planning.The patient is planning to be discharged home with outpt PT   Anticipated LOS equal to or greater than 2 midnights due to - Age 89 and older with one or more of the following:  - Obesity  - Expected need for hospital services (PT, OT, Nursing) required for safe  discharge  - Anticipated need for postoperative skilled nursing care or inpatient rehab  - Active co-morbidities: Stroke OR   - Unanticipated findings during/Post Surgery: None  - Patient is a high risk of re-admission due to: None

## 2020-06-27 NOTE — Progress Notes (Signed)
DUE TO COVID-19 ONLY ONE VISITOR IS ALLOWED TO COME WITH YOU AND STAY IN THE WAITING ROOM ONLY DURING PRE OP AND PROCEDURE DAY OF SURGERY. THE 1 VISITOR  MAY VISIT WITH YOU AFTER SURGERY IN YOUR PRIVATE ROOM DURING VISITING HOURS ONLY!  YOU NEED TO HAVE A COVID 19 TEST ON__11/16/2021 _____ @_______ , THIS TEST MUST BE DONE BEFORE SURGERY,  COVID TESTING SITE [address] [place] [address], IT IS ON THE RIGHT GOING OUT [address] APPROXIMATELY  2 MINUTES PAST [employer] ON THE RIGHT. ONCE YOUR COVID TEST IS COMPLETED,  PLEASE BEGIN THE QUARANTINE INSTRUCTIONS AS OUTLINED IN YOUR HANDOUT.                CONSUELLA SCURLOCK  06/27/2020   Your procedure is scheduled on:  06/20/2020    Report to [facility] Main  Entrance   Report to admitting at     0745am     Call this number if you have problems the morning of surgery [phone]    REMEMBER: NO  SOLID FOOD CANDY OR GUM AFTER MIDNIGHT. CLEAR LIQUIDS UNTIL   0715am        . NOTHING BY MOUTH EXCEPT CLEAR LIQUIDS UNTIL    . PLEASE FINISH ENSURE DRINK PER SURGEON ORDER  WHICH NEEDS TO BE COMPLETED AT 0715am      .      CLEAR LIQUID DIET   Foods Allowed                                                                    Coffee and tea, regular and decaf                            Fruit ices (not with fruit pulp)                                      Iced Popsicles                                    Carbonated beverages, regular and diet                                    Cranberry, grape and apple juices Sports drinks like Gatorade Lightly seasoned clear broth or consume(fat free) Sugar, honey syrup ___________________________________________________________________      BRUSH YOUR TEETH MORNING OF SURGERY AND RINSE YOUR MOUTH OUT, NO CHEWING GUM CANDY OR MINTS.     Take these medicines the morning of surgery with A SIP OF WATER:  Xanax if needed, amlodipine, inhalers as usual andri, protonix,  topamax  DO NOT TAKE ANY DIABETIC MEDICATIONS DAY OF YOUR SURGERY                               You may not have any metal on your body including hair pins and  piercings  Do not wear jewelry, make-up, lotions, powders or perfumes, deodorant             Do not wear nail polish on your fingernails.  Do not shave  48 hours prior to surgery.              Men may shave face and neck.   Do not bring valuables to the hospital. [place].  Contacts, dentures or bridgework may not be worn into surgery.  Leave suitcase in the car. After surgery it may be brought to your room.     Patients discharged the day of surgery will not be allowed to drive home. IF YOU ARE HAVING SURGERY AND GOING HOME THE SAME DAY, YOU MUST HAVE AN ADULT TO DRIVE YOU HOME AND BE WITH YOU FOR 24 HOURS. YOU MAY GO HOME BY TAXI OR UBER OR ORTHERWISE, BUT AN ADULT MUST ACCOMPANY YOU HOME AND STAY WITH YOU FOR 24 HOURS.  Name and phone number of your driver:  Special Instructions: N/A              Please read over the following fact sheets you were given: _____________________________________________________________________  [facility] - Preparing for Surgery Before surgery, you can play an important role.  Because skin is not sterile, your skin needs to be as free of germs as possible.  You can reduce the number of germs on your skin by washing with CHG (chlorahexidine gluconate) soap before surgery.  CHG is an antiseptic cleaner which kills germs and bonds with the skin to continue killing germs even after washing. Please DO NOT use if you have an allergy to CHG or antibacterial soaps.  If your skin becomes reddened/irritated stop using the CHG and inform your nurse when you arrive at Short Stay. Do not shave (including legs and underarms) for at least 48 hours prior to the first CHG shower.  You may shave your face/neck. Please follow these instructions  carefully:  1.  Shower with CHG Soap the night before surgery and the  morning of Surgery.  2.  If you choose to wash your hair, wash your hair first as usual with your  normal  shampoo.  3.  After you shampoo, rinse your hair and body thoroughly to remove the  shampoo.                           4.  Use CHG as you would any other liquid soap.  You can apply chg directly  to the skin and wash                       Gently with a scrungie or clean washcloth.  5.  Apply the CHG Soap to your body ONLY FROM THE NECK DOWN.   Do not use on face/ open                           Wound or open sores. Avoid contact with eyes, ears mouth and genitals (private parts).                       Wash face,  Genitals (private parts) with your normal soap.             6.  Wash thoroughly, paying special attention to the area where your surgery  will be performed.  7.  Thoroughly rinse your body with warm water from the neck down.  8.  DO NOT shower/wash with your normal soap after using and rinsing off  the CHG Soap.                9.  Pat yourself dry with a clean towel.            10.  Wear clean pajamas.            11.  Place clean sheets on your bed the night of your first shower and do not  sleep with pets. Day of Surgery : Do not apply any lotions/deodorants the morning of surgery.  Please wear clean clothes to the hospital/surgery center.  FAILURE TO FOLLOW THESE INSTRUCTIONS MAY RESULT IN THE CANCELLATION OF YOUR SURGERY PATIENT SIGNATURE_________________________________  NURSE SIGNATURE__________________________________  ________________________________________________________________________

## 2020-06-28 ENCOUNTER — Other Ambulatory Visit: Payer: Self-pay

## 2020-06-28 ENCOUNTER — Encounter (HOSPITAL_COMMUNITY)
Admission: RE | Admit: 2020-06-28 | Discharge: 2020-06-28 | Disposition: A | Discharge status: Home / Self Care | Payer: PPO | Source: Ambulatory Visit | Attending: Orthopedic Surgery | Admitting: Orthopedic Surgery

## 2020-06-28 ENCOUNTER — Encounter (HOSPITAL_COMMUNITY): Payer: Self-pay

## 2020-06-28 DIAGNOSIS — Z01818 Encounter for other preprocedural examination: Secondary | ICD-10-CM | POA: Diagnosis not present

## 2020-06-28 HISTORY — DX: Unspecified osteoarthritis, unspecified site: M19.90

## 2020-06-28 HISTORY — DX: Cerebral infarction, unspecified: I63.9

## 2020-06-28 LAB — BASIC METABOLIC PANEL
Anion gap: 3 — ABNORMAL LOW (ref 5–15)
BUN: 11 mg/dL (ref 8–23)
CO2: 26 mmol/L (ref 22–32)
Calcium: 8.8 mg/dL — ABNORMAL LOW (ref 8.9–10.3)
Chloride: 112 mmol/L — ABNORMAL HIGH (ref 98–111)
Creatinine, Ser: 1.04 mg/dL — ABNORMAL HIGH (ref 0.44–1.00)
GFR, Estimated: >60 mL/min (ref >60)
Glucose, Bld: 98 mg/dL (ref 70–99)
Potassium: 4.6 mmol/L (ref 3.5–5.1)
Sodium: 141 mmol/L (ref 135–145)

## 2020-06-28 LAB — CBC
HCT: 42.5 % (ref 36.0–46.0)
Hemoglobin: 13.4 g/dL (ref 12.0–15.0)
MCH: 30 pg (ref 26.0–34.0)
MCHC: 31.5 g/dL (ref 30.0–36.0)
MCV: 95.3 fL (ref 80.0–100.0)
Platelets: 289 10*3/uL (ref 150–400)
RBC: 4.46 MIL/uL (ref 3.87–5.11)
RDW: 14.2 % (ref 11.5–15.5)
WBC: 6.3 10*3/uL (ref 4.0–10.5)
nRBC: 0 % (ref 0.0–0.2)

## 2020-06-28 LAB — SURGICAL PCR SCREEN
MRSA, PCR: NEGATIVE
Staphylococcus aureus: POSITIVE — AB

## 2020-06-29 DIAGNOSIS — M67814 Other specified disorders of tendon, left shoulder: Secondary | ICD-10-CM | POA: Diagnosis not present

## 2020-06-29 DIAGNOSIS — M75102 Unspecified rotator cuff tear or rupture of left shoulder, not specified as traumatic: Secondary | ICD-10-CM | POA: Diagnosis not present

## 2020-06-29 DIAGNOSIS — M19012 Primary osteoarthritis, left shoulder: Secondary | ICD-10-CM | POA: Diagnosis not present

## 2020-06-29 NOTE — Progress Notes (Signed)
pcr screen done 06/28/2020 positive for staph routed via epic to Dr French Ana.

## 2020-07-01 DIAGNOSIS — M25512 Pain in left shoulder: Secondary | ICD-10-CM | POA: Diagnosis not present

## 2020-07-05 ENCOUNTER — Other Ambulatory Visit (HOSPITAL_COMMUNITY)
Admission: RE | Admit: 2020-07-05 | Discharge: 2020-07-05 | Disposition: A | Discharge status: Home / Self Care | Payer: PPO | Source: Ambulatory Visit | Attending: Orthopedic Surgery | Admitting: Orthopedic Surgery

## 2020-07-05 DIAGNOSIS — Z20822 Contact with and (suspected) exposure to covid-19: Secondary | ICD-10-CM | POA: Diagnosis not present

## 2020-07-05 DIAGNOSIS — Z01812 Encounter for preprocedural laboratory examination: Secondary | ICD-10-CM | POA: Insufficient documentation

## 2020-07-05 LAB — SARS CORONAVIRUS 2 (TAT 6-24 HRS): SARS Coronavirus 2: NEGATIVE

## 2020-07-07 MED ORDER — TRANEXAMIC ACID 1000 MG/10ML IV SOLN
2000.0000 mg | INTRAVENOUS | Status: DC
  Filled 2020-07-07: qty 20

## 2020-07-08 ENCOUNTER — Encounter (HOSPITAL_COMMUNITY)
Admission: RE | Disposition: A | Discharge status: Home / Self Care | Payer: Self-pay | Source: Ambulatory Visit | Attending: Orthopedic Surgery

## 2020-07-08 ENCOUNTER — Observation Stay (HOSPITAL_COMMUNITY)
Admission: RE | Admit: 2020-07-08 | Discharge: 2020-07-11 | Disposition: A | Discharge status: Home / Self Care | Payer: PPO | Source: Ambulatory Visit | Attending: Orthopedic Surgery | Admitting: Orthopedic Surgery

## 2020-07-08 ENCOUNTER — Ambulatory Visit (HOSPITAL_COMMUNITY): Payer: PPO | Admitting: Anesthesiology

## 2020-07-08 ENCOUNTER — Encounter (HOSPITAL_COMMUNITY): Payer: Self-pay | Admitting: Orthopedic Surgery

## 2020-07-08 ENCOUNTER — Ambulatory Visit (HOSPITAL_COMMUNITY): Payer: PPO

## 2020-07-08 ENCOUNTER — Other Ambulatory Visit: Payer: Self-pay

## 2020-07-08 DIAGNOSIS — M87051 Idiopathic aseptic necrosis of right femur: Principal | ICD-10-CM | POA: Insufficient documentation

## 2020-07-08 DIAGNOSIS — I10 Essential (primary) hypertension: Secondary | ICD-10-CM | POA: Insufficient documentation

## 2020-07-08 DIAGNOSIS — Z87891 Personal history of nicotine dependence: Secondary | ICD-10-CM | POA: Insufficient documentation

## 2020-07-08 DIAGNOSIS — Z96641 Presence of right artificial hip joint: Secondary | ICD-10-CM | POA: Diagnosis present

## 2020-07-08 DIAGNOSIS — M25551 Pain in right hip: Secondary | ICD-10-CM

## 2020-07-08 DIAGNOSIS — J45909 Unspecified asthma, uncomplicated: Secondary | ICD-10-CM | POA: Diagnosis not present

## 2020-07-08 DIAGNOSIS — K219 Gastro-esophageal reflux disease without esophagitis: Secondary | ICD-10-CM | POA: Diagnosis not present

## 2020-07-08 DIAGNOSIS — Z79899 Other long term (current) drug therapy: Secondary | ICD-10-CM | POA: Diagnosis not present

## 2020-07-08 DIAGNOSIS — M1611 Unilateral primary osteoarthritis, right hip: Secondary | ICD-10-CM | POA: Diagnosis not present

## 2020-07-08 DIAGNOSIS — J4540 Moderate persistent asthma, uncomplicated: Secondary | ICD-10-CM | POA: Diagnosis not present

## 2020-07-08 HISTORY — PX: TOTAL HIP ARTHROPLASTY: SHX124

## 2020-07-08 LAB — TYPE AND SCREEN
ABO/RH(D): A POS
Antibody Screen: NEGATIVE

## 2020-07-08 SURGERY — ARTHROPLASTY, HIP, TOTAL,POSTERIOR APPROACH
Anesthesia: General | Site: Hip | Laterality: Right

## 2020-07-08 MED ORDER — CHLORHEXIDINE GLUCONATE 0.12 % MT SOLN
15.0000 mL | Freq: Once | OROMUCOSAL | Status: AC
  Administered 2020-07-08: 15 mL via OROMUCOSAL

## 2020-07-08 MED ORDER — ALPRAZOLAM 1 MG PO TABS
1.0000 mg | ORAL_TABLET | Freq: Four times a day (QID) | ORAL | Status: DC | PRN
  Administered 2020-07-08 – 2020-07-10 (×5): 1 mg via ORAL
  Filled 2020-07-08 (×5): qty 1

## 2020-07-08 MED ORDER — SENNOSIDES-DOCUSATE SODIUM 8.6-50 MG PO TABS
1.0000 | ORAL_TABLET | Freq: Every evening | ORAL | Status: DC | PRN
  Filled 2020-07-08: qty 1

## 2020-07-08 MED ORDER — BUPIVACAINE-EPINEPHRINE (PF) 0.25% -1:200000 IJ SOLN
INTRAMUSCULAR | Status: AC
  Filled 2020-07-08: qty 30

## 2020-07-08 MED ORDER — POVIDONE-IODINE 10 % EX SWAB
2.0000 "application " | Freq: Once | CUTANEOUS | Status: AC
  Administered 2020-07-08: 2 via TOPICAL

## 2020-07-08 MED ORDER — MENTHOL 3 MG MT LOZG: 1.0000 | LOZENGE | OROMUCOSAL | Status: DC | PRN

## 2020-07-08 MED ORDER — BUPIVACAINE-EPINEPHRINE 0.25% -1:200000 IJ SOLN
INTRAMUSCULAR | Status: DC | PRN
  Administered 2020-07-08: 20 mL

## 2020-07-08 MED ORDER — ACETAMINOPHEN 325 MG PO TABS
325.0000 mg | ORAL_TABLET | Freq: Four times a day (QID) | ORAL | Status: DC | PRN
  Administered 2020-07-09 – 2020-07-11 (×6): 650 mg via ORAL
  Filled 2020-07-08 (×6): qty 2

## 2020-07-08 MED ORDER — OXYCODONE HCL 5 MG PO TABS: 5.0000 mg | ORAL_TABLET | Freq: Once | ORAL | Status: AC | PRN

## 2020-07-08 MED ORDER — BUPIVACAINE LIPOSOME 1.3 % IJ SUSP: 10.0000 mL | Freq: Once | INTRAMUSCULAR | Status: DC

## 2020-07-08 MED ORDER — FENTANYL CITRATE (PF) 100 MCG/2ML IJ SOLN
INTRAMUSCULAR | Status: AC
  Filled 2020-07-08: qty 2

## 2020-07-08 MED ORDER — CEFAZOLIN SODIUM-DEXTROSE 2-4 GM/100ML-% IV SOLN
2.0000 g | INTRAVENOUS | Status: AC
  Administered 2020-07-08: 2 g via INTRAVENOUS
  Filled 2020-07-08: qty 100

## 2020-07-08 MED ORDER — HYDROMORPHONE HCL 1 MG/ML IJ SOLN
0.5000 mg | INTRAMUSCULAR | Status: DC | PRN
  Administered 2020-07-08 – 2020-07-09 (×5): 1 mg via INTRAVENOUS
  Administered 2020-07-10: 0.5 mg via INTRAVENOUS
  Administered 2020-07-10: 1 mg via INTRAVENOUS
  Filled 2020-07-08 (×7): qty 1

## 2020-07-08 MED ORDER — ASPIRIN EC 81 MG PO TBEC: 81.0000 mg | DELAYED_RELEASE_TABLET | Freq: Two times a day (BID) | ORAL | 0 refills | Status: DC

## 2020-07-08 MED ORDER — OXYCODONE HCL 5 MG PO TABS
ORAL_TABLET | ORAL | Status: AC
  Administered 2020-07-08: 5 mg via ORAL
  Filled 2020-07-08: qty 1

## 2020-07-08 MED ORDER — ROCURONIUM BROMIDE 100 MG/10ML IV SOLN
INTRAVENOUS | Status: DC | PRN
Start: 2020-07-08 — End: 2020-07-08
  Administered 2020-07-08: 10 mg via INTRAVENOUS
  Administered 2020-07-08: 30 mg via INTRAVENOUS
  Administered 2020-07-08: 40 mg via INTRAVENOUS

## 2020-07-08 MED ORDER — BUPIVACAINE LIPOSOME 1.3 % IJ SUSP
10.0000 mL | Freq: Once | INTRAMUSCULAR | Status: DC
  Filled 2020-07-08: qty 10

## 2020-07-08 MED ORDER — PHENOL 1.4 % MT LIQD: 1.0000 | OROMUCOSAL | Status: DC | PRN

## 2020-07-08 MED ORDER — OXYCODONE HCL 5 MG PO TABS
5.0000 mg | ORAL_TABLET | ORAL | Status: DC | PRN
  Administered 2020-07-08: 10 mg via ORAL
  Administered 2020-07-08: 5 mg via ORAL
  Administered 2020-07-09 – 2020-07-11 (×13): 10 mg via ORAL
  Filled 2020-07-08 (×10): qty 2
  Filled 2020-07-08: qty 1
  Filled 2020-07-08 (×4): qty 2

## 2020-07-08 MED ORDER — ACETAMINOPHEN 10 MG/ML IV SOLN: 1000.0000 mg | Freq: Once | INTRAVENOUS | Status: DC | PRN

## 2020-07-08 MED ORDER — ONDANSETRON HCL 4 MG PO TABS: 4.0000 mg | ORAL_TABLET | Freq: Four times a day (QID) | ORAL | Status: DC | PRN

## 2020-07-08 MED ORDER — BUPIVACAINE LIPOSOME 1.3 % IJ SUSP
INTRAMUSCULAR | Status: DC | PRN
  Administered 2020-07-08: 10 mL

## 2020-07-08 MED ORDER — LIDOCAINE HCL (CARDIAC) PF 100 MG/5ML IV SOSY
PREFILLED_SYRINGE | INTRAVENOUS | Status: DC | PRN
  Administered 2020-07-08: 40 mg via INTRAVENOUS

## 2020-07-08 MED ORDER — MIDAZOLAM HCL 5 MG/5ML IJ SOLN
INTRAMUSCULAR | Status: DC | PRN
  Administered 2020-07-08: 2 mg via INTRAVENOUS

## 2020-07-08 MED ORDER — FENTANYL CITRATE (PF) 100 MCG/2ML IJ SOLN
INTRAMUSCULAR | Status: DC | PRN
  Administered 2020-07-08 (×4): 50 ug via INTRAVENOUS

## 2020-07-08 MED ORDER — LACTATED RINGERS IV BOLUS: 500.0000 mL | Freq: Once | INTRAVENOUS | Status: DC

## 2020-07-08 MED ORDER — SODIUM CHLORIDE (PF) 0.9 % IJ SOLN
INTRAMUSCULAR | Status: AC
  Filled 2020-07-08: qty 20

## 2020-07-08 MED ORDER — DEXMEDETOMIDINE (PRECEDEX) IN NS 20 MCG/5ML (4 MCG/ML) IV SYRINGE
PREFILLED_SYRINGE | INTRAVENOUS | Status: DC | PRN
  Administered 2020-07-08 (×2): 10 ug via INTRAVENOUS

## 2020-07-08 MED ORDER — METOCLOPRAMIDE HCL 5 MG/ML IJ SOLN
5.0000 mg | Freq: Three times a day (TID) | INTRAMUSCULAR | Status: DC | PRN
  Administered 2020-07-08: 10 mg via INTRAVENOUS
  Filled 2020-07-08: qty 2

## 2020-07-08 MED ORDER — TRANEXAMIC ACID-NACL 1000-0.7 MG/100ML-% IV SOLN
1000.0000 mg | INTRAVENOUS | Status: AC
  Administered 2020-07-08: 1000 mg via INTRAVENOUS
  Filled 2020-07-08: qty 100

## 2020-07-08 MED ORDER — HYDROMORPHONE HCL 1 MG/ML IJ SOLN
0.2500 mg | INTRAMUSCULAR | Status: DC | PRN
  Administered 2020-07-08 (×3): 0.5 mg via INTRAVENOUS

## 2020-07-08 MED ORDER — FUROSEMIDE 40 MG PO TABS: 40.0000 mg | ORAL_TABLET | Freq: Every day | ORAL | Status: DC | PRN

## 2020-07-08 MED ORDER — SODIUM CHLORIDE 0.9 % IV SOLN: INTRAVENOUS | Status: DC

## 2020-07-08 MED ORDER — AMLODIPINE BESYLATE 5 MG PO TABS
5.0000 mg | ORAL_TABLET | Freq: Every day | ORAL | Status: DC
  Administered 2020-07-09 – 2020-07-11 (×3): 5 mg via ORAL
  Filled 2020-07-08 (×3): qty 1

## 2020-07-08 MED ORDER — PHENYLEPHRINE HCL-NACL 10-0.9 MG/250ML-% IV SOLN
INTRAVENOUS | Status: DC | PRN
  Administered 2020-07-08: 50 ug/min via INTRAVENOUS

## 2020-07-08 MED ORDER — PROMETHAZINE HCL 25 MG/ML IJ SOLN: 6.2500 mg | INTRAMUSCULAR | Status: DC | PRN

## 2020-07-08 MED ORDER — TRANEXAMIC ACID 1000 MG/10ML IV SOLN
INTRAVENOUS | Status: DC | PRN
  Administered 2020-07-08: 2000 mg via TOPICAL

## 2020-07-08 MED ORDER — MIDAZOLAM HCL 2 MG/2ML IJ SOLN
INTRAMUSCULAR | Status: AC
  Filled 2020-07-08: qty 2

## 2020-07-08 MED ORDER — ORAL CARE MOUTH RINSE: 15.0000 mL | Freq: Once | OROMUCOSAL | Status: AC

## 2020-07-08 MED ORDER — MOMETASONE FURO-FORMOTEROL FUM 200-5 MCG/ACT IN AERO
2.0000 | INHALATION_SPRAY | Freq: Two times a day (BID) | RESPIRATORY_TRACT | Status: DC
  Administered 2020-07-08 – 2020-07-11 (×6): 2 via RESPIRATORY_TRACT
  Filled 2020-07-08: qty 8.8

## 2020-07-08 MED ORDER — SODIUM CHLORIDE (PF) 0.9 % IJ SOLN
INTRAMUSCULAR | Status: DC | PRN
  Administered 2020-07-08: 20 mL

## 2020-07-08 MED ORDER — ONDANSETRON HCL 4 MG/2ML IJ SOLN
4.0000 mg | Freq: Four times a day (QID) | INTRAMUSCULAR | Status: DC | PRN
  Administered 2020-07-08: 4 mg via INTRAVENOUS
  Filled 2020-07-08: qty 2

## 2020-07-08 MED ORDER — PROPOFOL 10 MG/ML IV BOLUS
INTRAVENOUS | Status: DC | PRN
  Administered 2020-07-08: 200 mg via INTRAVENOUS

## 2020-07-08 MED ORDER — LACTATED RINGERS IV SOLN: INTRAVENOUS | Status: DC

## 2020-07-08 MED ORDER — ONDANSETRON HCL 4 MG/2ML IJ SOLN
INTRAMUSCULAR | Status: DC | PRN
  Administered 2020-07-08: 4 mg via INTRAVENOUS

## 2020-07-08 MED ORDER — METHOCARBAMOL 500 MG PO TABS
750.0000 mg | ORAL_TABLET | Freq: Four times a day (QID) | ORAL | Status: DC | PRN
  Administered 2020-07-08 – 2020-07-11 (×10): 750 mg via ORAL
  Filled 2020-07-08 (×10): qty 2

## 2020-07-08 MED ORDER — OXYCODONE HCL 5 MG/5ML PO SOLN: 5.0000 mg | Freq: Once | ORAL | Status: AC | PRN

## 2020-07-08 MED ORDER — PHENYLEPHRINE 40 MCG/ML (10ML) SYRINGE FOR IV PUSH (FOR BLOOD PRESSURE SUPPORT)
PREFILLED_SYRINGE | INTRAVENOUS | Status: DC | PRN
  Administered 2020-07-08 (×2): 120 ug via INTRAVENOUS

## 2020-07-08 MED ORDER — TRANEXAMIC ACID-NACL 1000-0.7 MG/100ML-% IV SOLN
1000.0000 mg | Freq: Once | INTRAVENOUS | Status: AC
  Administered 2020-07-08: 1000 mg via INTRAVENOUS
  Filled 2020-07-08: qty 100

## 2020-07-08 MED ORDER — SUGAMMADEX SODIUM 200 MG/2ML IV SOLN
INTRAVENOUS | Status: DC | PRN
  Administered 2020-07-08: 200 mg via INTRAVENOUS

## 2020-07-08 MED ORDER — SORBITOL 70 % SOLN
30.0000 mL | Freq: Every day | Status: DC | PRN
  Filled 2020-07-08: qty 30

## 2020-07-08 MED ORDER — ASPIRIN 81 MG PO CHEW
81.0000 mg | CHEWABLE_TABLET | Freq: Two times a day (BID) | ORAL | Status: DC
  Administered 2020-07-08 – 2020-07-11 (×6): 81 mg via ORAL
  Filled 2020-07-08 (×6): qty 1

## 2020-07-08 MED ORDER — PHENYLEPHRINE 40 MCG/ML (10ML) SYRINGE FOR IV PUSH (FOR BLOOD PRESSURE SUPPORT)
PREFILLED_SYRINGE | INTRAVENOUS | Status: AC
  Filled 2020-07-08: qty 10

## 2020-07-08 MED ORDER — METOCLOPRAMIDE HCL 5 MG PO TABS: 5.0000 mg | ORAL_TABLET | Freq: Three times a day (TID) | ORAL | Status: DC | PRN

## 2020-07-08 MED ORDER — FLEET ENEMA 7-19 GM/118ML RE ENEM: 1.0000 | ENEMA | Freq: Once | RECTAL | Status: DC | PRN

## 2020-07-08 MED ORDER — ALBUTEROL SULFATE HFA 108 (90 BASE) MCG/ACT IN AERS: 2.0000 | INHALATION_SPRAY | RESPIRATORY_TRACT | Status: DC | PRN

## 2020-07-08 MED ORDER — 0.9 % SODIUM CHLORIDE (POUR BTL) OPTIME
TOPICAL | Status: DC | PRN
  Administered 2020-07-08: 1000 mL

## 2020-07-08 MED ORDER — WATER FOR IRRIGATION, STERILE IR SOLN
Status: DC | PRN
  Administered 2020-07-08: 1000 mL

## 2020-07-08 MED ORDER — HYDROMORPHONE HCL 1 MG/ML IJ SOLN
INTRAMUSCULAR | Status: AC
  Administered 2020-07-08: 0.5 mg via INTRAVENOUS
  Filled 2020-07-08: qty 2

## 2020-07-08 MED ORDER — LACTATED RINGERS IV BOLUS: 250.0000 mL | Freq: Once | INTRAVENOUS | Status: DC

## 2020-07-08 MED ORDER — OXYCODONE HCL 5 MG PO TABS
ORAL_TABLET | ORAL | 0 refills | Status: DC
Start: 2020-07-08 — End: 2020-12-25

## 2020-07-08 MED ORDER — DOCUSATE SODIUM 100 MG PO CAPS
100.0000 mg | ORAL_CAPSULE | Freq: Two times a day (BID) | ORAL | Status: DC
  Administered 2020-07-09 – 2020-07-11 (×5): 100 mg via ORAL
  Filled 2020-07-08 (×6): qty 1

## 2020-07-08 MED ORDER — VANCOMYCIN HCL IN DEXTROSE 1-5 GM/200ML-% IV SOLN
1000.0000 mg | Freq: Two times a day (BID) | INTRAVENOUS | Status: AC
  Administered 2020-07-08 – 2020-07-09 (×2): 1000 mg via INTRAVENOUS
  Filled 2020-07-08 (×2): qty 200

## 2020-07-08 MED ORDER — DIPHENHYDRAMINE HCL 12.5 MG/5ML PO ELIX
12.5000 mg | ORAL_SOLUTION | ORAL | Status: DC | PRN
  Administered 2020-07-09 – 2020-07-10 (×5): 25 mg via ORAL
  Filled 2020-07-08 (×5): qty 10

## 2020-07-08 MED ORDER — PAROXETINE HCL 20 MG PO TABS
20.0000 mg | ORAL_TABLET | Freq: Every day | ORAL | Status: DC
  Administered 2020-07-08 – 2020-07-10 (×3): 20 mg via ORAL
  Filled 2020-07-08 (×3): qty 1

## 2020-07-08 SURGICAL SUPPLY — 58 items
APL SKNCLS STERI-STRIP NONHPOA (GAUZE/BANDAGES/DRESSINGS) ×1
BAG DECANTER FOR FLEXI CONT (MISCELLANEOUS) ×3 IMPLANT
BAG SPEC THK2 15X12 ZIP CLS (MISCELLANEOUS) ×1
BAG ZIPLOCK 12X15 (MISCELLANEOUS) ×3 IMPLANT
BENZOIN TINCTURE PRP APPL 2/3 (GAUZE/BANDAGES/DRESSINGS) ×3 IMPLANT
BLADE SAW SAG 73X25 THK (BLADE) ×2
BLADE SAW SGTL 73X25 THK (BLADE) ×1 IMPLANT
CLOSURE STERI-STRIP 1/2X4 (GAUZE/BANDAGES/DRESSINGS) ×1
CLSR STERI-STRIP ANTIMIC 1/2X4 (GAUZE/BANDAGES/DRESSINGS) ×2 IMPLANT
COVER SURGICAL LIGHT HANDLE (MISCELLANEOUS) ×3 IMPLANT
COVER WAND RF STERILE (DRAPES) IMPLANT
DECANTER SPIKE VIAL GLASS SM (MISCELLANEOUS) ×9 IMPLANT
DRAPE 3/4 80X56 (DRAPES) ×3 IMPLANT
DRAPE INCISE IOBAN 66X45 STRL (DRAPES) ×3 IMPLANT
DRAPE ORTHO SPLIT 77X108 STRL (DRAPES) ×6
DRAPE POUCH INSTRU U-SHP 10X18 (DRAPES) ×3 IMPLANT
DRAPE SURG ORHT 6 SPLT 77X108 (DRAPES) ×2 IMPLANT
DRAPE U-SHAPE 47X51 STRL (DRAPES) ×3 IMPLANT
DRESSING AQUACEL AG SP 3.5X10 (GAUZE/BANDAGES/DRESSINGS) ×1 IMPLANT
DRSG AQUACEL AG ADV 3.5X14 (GAUZE/BANDAGES/DRESSINGS) ×3 IMPLANT
DRSG AQUACEL AG SP 3.5X10 (GAUZE/BANDAGES/DRESSINGS) ×3
DURAPREP 26ML APPLICATOR (WOUND CARE) ×3 IMPLANT
ELECT BLADE TIP CTD 4 INCH (ELECTRODE) ×3 IMPLANT
ELECT REM PT RETURN 15FT ADLT (MISCELLANEOUS) ×3 IMPLANT
FACESHIELD WRAPAROUND (MASK) ×9 IMPLANT
GLOVE BIOGEL PI IND STRL 8 (GLOVE) ×2 IMPLANT
GLOVE BIOGEL PI INDICATOR 8 (GLOVE) ×4
GLOVE SURG ORTHO 8.0 STRL STRW (GLOVE) ×3 IMPLANT
GLOVE SURG SS PI 7.5 STRL IVOR (GLOVE) ×3 IMPLANT
GOWN STRL REUS W/TWL XL LVL3 (GOWN DISPOSABLE) ×6 IMPLANT
HEAD CERAMIC 36 PLUS5 (Hips) ×3 IMPLANT
HOLDER FOLEY CATH W/STRAP (MISCELLANEOUS) ×3 IMPLANT
HOOD PEEL AWAY FLYTE STAYCOOL (MISCELLANEOUS) ×3 IMPLANT
IMMOBILIZER KNEE 20 (SOFTGOODS) ×3
IMMOBILIZER KNEE 20 THIGH 36 (SOFTGOODS) ×1 IMPLANT
KIT BASIN OR (CUSTOM PROCEDURE TRAY) ×3 IMPLANT
KIT TURNOVER KIT A (KITS) IMPLANT
LINER NEUTRAL 52X36X52 PLUS 4 (Liner) ×3 IMPLANT
MANIFOLD NEPTUNE II (INSTRUMENTS) ×3 IMPLANT
NEEDLE HYPO 22GX1.5 SAFETY (NEEDLE) ×6 IMPLANT
NS IRRIG 1000ML POUR BTL (IV SOLUTION) ×3 IMPLANT
PACK TOTAL JOINT (CUSTOM PROCEDURE TRAY) ×3 IMPLANT
PENCIL SMOKE EVACUATOR (MISCELLANEOUS) IMPLANT
PIN SECTOR W/GRIP ACE CUP 52MM (Hips) ×3 IMPLANT
PROTECTOR NERVE ULNAR (MISCELLANEOUS) ×3 IMPLANT
STAPLER VISISTAT 35W (STAPLE) IMPLANT
STEM FEM CMNTLSS SM AML 15.0 (Hips) ×3 IMPLANT
SUCTION FRAZIER HANDLE 12FR (TUBING) ×3
SUCTION TUBE FRAZIER 12FR DISP (TUBING) ×1 IMPLANT
SUT ETHIBOND NAB CT1 #1 30IN (SUTURE) ×12 IMPLANT
SUT MNCRL AB 3-0 PS2 18 (SUTURE) ×3 IMPLANT
SUT VIC AB 0 CT1 36 (SUTURE) ×6 IMPLANT
SUT VIC AB 2-0 CT1 27 (SUTURE) ×6
SUT VIC AB 2-0 CT1 TAPERPNT 27 (SUTURE) ×2 IMPLANT
SYR CONTROL 10ML LL (SYRINGE) ×6 IMPLANT
TOWEL OR 17X26 10 PK STRL BLUE (TOWEL DISPOSABLE) ×3 IMPLANT
TRAY FOLEY MTR SLVR 16FR STAT (SET/KITS/TRAYS/PACK) ×3 IMPLANT
WATER STERILE IRR 1000ML POUR (IV SOLUTION) ×6 IMPLANT

## 2020-07-08 NOTE — Interval H&P Note (Signed)
History and Physical Interval Note:  07/08/2020 9:46 AM  [name]  has presented today for surgery, with the diagnosis of OA RIGHT HIP.  The various methods of treatment have been discussed with the patient and family. After consideration of risks, benefits and other options for treatment, the patient has consented to  Procedure(s): TOTAL HIP ARTHROPLASTY (Right) as a surgical intervention.  The patient's history has been reviewed, patient examined, no change in status, stable for surgery.  I have reviewed the patient's chart and labs.  Questions were answered to the patient's satisfaction.     Yvette Rack

## 2020-07-08 NOTE — Anesthesia Preprocedure Evaluation (Addendum)
Anesthesia Evaluation  Patient identified by MRN, date of birth, ID band Patient awake    Reviewed: Allergy & Precautions, NPO status , Patient's Chart, lab work & pertinent test results  Airway Mallampati: II  TM Distance: >3 FB Neck ROM: Full    Dental no notable dental hx.    Pulmonary asthma , former smoker,    Pulmonary exam normal breath sounds clear to auscultation       Cardiovascular hypertension, Pt. on medications Normal cardiovascular exam Rhythm:Regular Rate:Normal  ECG: NSR, rate 74   Neuro/Psych PSYCHIATRIC DISORDERS Anxiety Depression CVA (right eye), Residual Symptoms    GI/Hepatic Neg liver ROS, GERD  Medicated and Controlled,  Endo/Other  negative endocrine ROS  Renal/GU negative Renal ROS     Musculoskeletal  (+) Arthritis , Fibromyalgia -  Abdominal   Peds  Hematology negative hematology ROS (+)   Anesthesia Other Findings OA RIGHT HIP  Reproductive/Obstetrics                            Anesthesia Physical Anesthesia Plan  ASA: III  Anesthesia Plan: General   Post-op Pain Management:    Induction: Intravenous  PONV Risk Score and Plan: 3 and Ondansetron, Dexamethasone, Midazolam and Treatment may vary due to age or medical condition  Airway Management Planned: Oral ETT  Additional Equipment:   Intra-op Plan:   Post-operative Plan: Extubation in OR  Informed Consent: I have reviewed the patients History and Physical, chart, labs and discussed the procedure including the risks, benefits and alternatives for the proposed anesthesia with the patient or authorized representative who has indicated his/her understanding and acceptance.     Dental advisory given  Plan Discussed with: CRNA  Anesthesia Plan Comments:         Anesthesia Quick Evaluation

## 2020-07-08 NOTE — Transfer of Care (Signed)
Immediate Anesthesia Transfer of Care Note  Patient: [name]  Procedure(s) Performed: TOTAL HIP ARTHROPLASTY (Right Hip)  Patient Location: PACU  Anesthesia Type:General  Level of Consciousness: awake, alert  and oriented  Airway & Oxygen Therapy: Patient Spontanous Breathing and Patient connected to face mask  Post-op Assessment: Report given to RN and Post -op Vital signs reviewed and stable  Post vital signs: Reviewed and stable  Last Vitals:  Vitals Value Taken Time  BP 140/86 07/08/20 1345  Temp 36.9 C 07/08/20 1345  Pulse 95 07/08/20 1350  Resp 17 07/08/20 1350  SpO2 97 % 07/08/20 1350  Vitals shown include unvalidated device data.  Last Pain:  Vitals:   07/08/20 1345  TempSrc:   PainSc: 10-Worst pain ever      Patients Stated Pain Goal: 3 (09/90/68 9340)  Complications: No complications documented.

## 2020-07-08 NOTE — Anesthesia Procedure Notes (Signed)
Procedure Name: Intubation Date/Time: 07/08/2020 11:29 AM Performed by: Niel Hummer, CRNA Pre-anesthesia Checklist: Patient identified, Emergency Drugs available, Suction available and Patient being monitored Patient Re-evaluated:Patient Re-evaluated prior to induction Oxygen Delivery Method: Circle system utilized Preoxygenation: Pre-oxygenation with 100% oxygen Induction Type: IV induction Ventilation: Mask ventilation without difficulty Laryngoscope Size: Mac and 4 Grade View: Grade I Tube type: Oral Tube size: 7.0 mm Number of attempts: 1 Airway Equipment and Method: Stylet Placement Confirmation: ETT inserted through vocal cords under direct vision,  positive ETCO2 and breath sounds checked- equal and bilateral Secured at: 22 cm Tube secured with: Tape Dental Injury: Teeth and Oropharynx as per pre-operative assessment

## 2020-07-08 NOTE — Evaluation (Signed)
Physical Therapy Evaluation Patient Details Name: [name] MRN: [id number] DOB: 1956/01/26 Today's Date: 07/08/2020   History of Present Illness  Patient is 64 y.o. female s/p Rt THA posterior approach on 07/08/20 with PMH significant for OA, depression, anxiety, HTN, GERD, fibromyalgia, Stroke, asthma.    Clinical Impression  [name] is a 64 y.o. female POD 0 s/p Rt THA. Patient reports independence with mobility at baseline. Patient is now limited by functional impairments (see PT problem list below) and requires min assist for transfers and gait with RW. Patient was able to ambulate ~6 feet with RW and min assist. Patient was greatly limited by pain today. Patient instructed in exercise to facilitate circulation and educated on posterior hip precautions. Patient will benefit from continued skilled PT interventions to address impairments and progress towards PLOF. Acute PT will follow to progress mobility and stair training in preparation for safe discharge home.     Follow Up Recommendations Follow surgeon's recommendation for DC plan and follow-up therapies;Home health PT    Equipment Recommendations  Rolling walker with 5" wheels;3in1 (PT)    Recommendations for Other Services       Precautions / Restrictions Precautions Precautions: Fall Restrictions Weight Bearing Restrictions: No RLE Weight Bearing: Weight bearing as tolerated      Mobility  Bed Mobility Overal bed mobility: Needs Assistance Bed Mobility: Supine to Sit     Supine to sit: Min assist;HOB elevated     General bed mobility comments: cues for safe sequencing to maintain posterior hip precautions and assist for Rt LE mobility. Pt using bed rail and min assist to scoot fully forward to EOB.     Transfers Overall transfer level: Needs assistance Equipment used: Rolling walker (2 wheeled) Transfers: Sit to/from Stand Sit to Stand: Min assist;From elevated surface         General  transfer comment: cues for technique with RW and to maintain posterior hip precautions, min assist for power up and to steady.   Ambulation/Gait Ambulation/Gait assistance: Min assist Gait Distance (Feet): 6 Feet Assistive device: Rolling walker (2 wheeled) Gait Pattern/deviations: Step-to pattern;Decreased stride length;Decreased weight shift to right Gait velocity: decr   General Gait Details: VC's for proximity to RW and step pattern, pt limited by pain and c/o overheated and nauseous sensation.   Stairs            Wheelchair Mobility    Modified Rankin (Stroke Patients Only)       Balance Overall balance assessment: Needs assistance Sitting-balance support: Feet supported;Bilateral upper extremity supported Sitting balance-Leahy Scale: Fair     Standing balance support: During functional activity;Bilateral upper extremity supported Standing balance-Leahy Scale: Poor                               Pertinent Vitals/Pain Pain Assessment: Faces Faces Pain Scale: Hurts whole lot Pain Location: Rt hip Pain Descriptors / Indicators: Aching;Discomfort;Grimacing;Guarding Pain Intervention(s): Limited activity within patient's tolerance;Monitored during session;Repositioned;Ice applied    Home Living Family/patient expects to be discharged to:: Private residence Living Arrangements: Spouse/significant other Available Help at Discharge: Family Type of Home: House Home Access: Stairs to enter Entrance Stairs-Rails: None Entrance Stairs-Number of Steps: 1 Home Layout: One level Home Equipment: None      Prior Function Level of Independence: Independent               Hand Dominance  Extremity/Trunk Assessment   Upper Extremity Assessment Upper Extremity Assessment: Overall WFL for tasks assessed    Lower Extremity Assessment Lower Extremity Assessment: RLE deficits/detail RLE: Unable to fully assess due to pain RLE Sensation:  WNL RLE Coordination: WNL    Cervical / Trunk Assessment Cervical / Trunk Assessment: Normal  Communication   Communication: No difficulties  Cognition Arousal/Alertness: Awake/alert Behavior During Therapy: WFL for tasks assessed/performed Overall Cognitive Status: Within Functional Limits for tasks assessed                                        General Comments      Exercises Total Joint Exercises Ankle Circles/Pumps: AROM;Both;20 reps;Seated   Assessment/Plan    PT Assessment Patient needs continued PT services  PT Problem List Decreased strength;Decreased range of motion;Decreased activity tolerance;Decreased balance;Decreased mobility;Decreased knowledge of use of DME;Decreased knowledge of precautions;Pain       PT Treatment Interventions DME instruction;Gait training;Stair training;Functional mobility training;Therapeutic activities;Therapeutic exercise;Balance training;Patient/family education    PT Goals (Current goals can be found in the Care Plan section)  Acute Rehab PT Goals Patient Stated Goal: regain independence PT Goal Formulation: With patient Time For Goal Achievement: 07/15/20 Potential to Achieve Goals: Good    Frequency 7X/week   Barriers to discharge        Co-evaluation               AM-PAC PT "6 Clicks" Mobility  Outcome Measure Help needed turning from your back to your side while in a flat bed without using bedrails?: A Little Help needed moving from lying on your back to sitting on the side of a flat bed without using bedrails?: A Little Help needed moving to and from a bed to a chair (including a wheelchair)?: A Little Help needed standing up from a chair using your arms (e.g., wheelchair or bedside chair)?: A Little Help needed to walk in hospital room?: A Little Help needed climbing 3-5 steps with a railing? : A Lot 6 Click Score: 17    End of Session Equipment Utilized During Treatment: Gait belt Activity  Tolerance: Patient tolerated treatment well Patient left: in chair;with call bell/phone within reach;with chair alarm set Nurse Communication: Mobility status PT Visit Diagnosis: Muscle weakness (generalized) (M62.81);Difficulty in walking, not elsewhere classified (R26.2);Pain Pain - Right/Left: Right Pain - part of body: Hip    Time: 2426-8341 PT Time Calculation (min) (ACUTE ONLY): 22 min   Charges:   PT Evaluation $PT Eval Low Complexity: 1 Low          Verner Mould, DPT Acute Rehabilitation Services  Office [phone] Pager [phone]  07/08/2020 6:33 PM

## 2020-07-09 ENCOUNTER — Ambulatory Visit (HOSPITAL_COMMUNITY): Payer: PPO

## 2020-07-09 DIAGNOSIS — Z96641 Presence of right artificial hip joint: Secondary | ICD-10-CM | POA: Diagnosis not present

## 2020-07-09 DIAGNOSIS — M87051 Idiopathic aseptic necrosis of right femur: Secondary | ICD-10-CM | POA: Diagnosis not present

## 2020-07-09 DIAGNOSIS — R531 Weakness: Secondary | ICD-10-CM | POA: Diagnosis not present

## 2020-07-09 MED ORDER — BETHANECHOL CHLORIDE 10 MG PO TABS
5.0000 mg | ORAL_TABLET | Freq: Three times a day (TID) | ORAL | Status: DC
  Administered 2020-07-09 – 2020-07-11 (×3): 5 mg via ORAL
  Filled 2020-07-09 (×4): qty 0.5
  Filled 2020-07-09: qty 1
  Filled 2020-07-09 (×2): qty 0.5
  Filled 2020-07-09: qty 1
  Filled 2020-07-09: qty 0.5

## 2020-07-09 MED ORDER — TAMSULOSIN HCL 0.4 MG PO CAPS: 0.4000 mg | ORAL_CAPSULE | Freq: Every day | ORAL | Status: DC

## 2020-07-09 NOTE — Progress Notes (Addendum)
Orthopaedic Trauma Service (OTS)  1 Day Post-Op Procedure(s) (LRB): TOTAL HIP ARTHROPLASTY (Right)  Subjective: Patient reports pain as severe and well localized anterior proximal femur.  Limited by pain with PT yesterday. Unable to void x 2 with 700 cc out with I&O.  Objective: Current Vitals Blood pressure 133/73, pulse 84, temperature 99.1 F (37.3 C), temperature source Oral, resp. rate 16, height 5\' 7"  (1.702 m), weight 81.3 kg, last menstrual period 12/13/2015, SpO2 97 %. Vital signs in last 24 hours: Temp:  [98 F (36.7 C)-99.1 F (37.3 C)] 99.1 F (37.3 C) (11/20 0701) Pulse Rate:  [77-91] 84 (11/20 0701) Resp:  [8-25] 16 (11/20 0701) BP: (120-155)/(66-96) 133/73 (11/20 0701) SpO2:  [94 %-100 %] 97 % (11/20 0754) Weight:  [81.3 kg] 81.3 kg (11/19 0805)  Intake/Output from previous day: 11/19 0701 - 11/20 0700 In: 2397.8 [P.O.:720; I.V.:1300; IV Piggyback:377.8] Out: 1250 [Urine:700; Blood:550]  LABS No results for input(s): HGB in the last 72 hours. No results for input(s): WBC, RBC, HCT, PLT in the last 72 hours. No results for input(s): NA, K, CL, CO2, BUN, CREATININE, GLUCOSE, CALCIUM in the last 72 hours. No results for input(s): LABPT, INR in the last 72 hours.   Physical Exam RLE Dressing intact, clean, dry  Edema/ swelling controlled  Sens: DPN, SPN, TN intact  Motor: EHL, FHL, and lessor toe ext and flex all intact grossly  Brisk cap refill, warm to touch  Assessment/Plan: 1 Day Post-Op Procedure(s) (LRB): TOTAL HIP ARTHROPLASTY (Right) 1. PT/OT WBAT post hip precautions 2. DVT proph Other (comment) ECASA 3. Reassured patient with regard to pattern of pain but will recheck AP, LAT xrays to eval for occult fractures s/p THA 4. Will place foley for urinary retention and then plan to remove and give voiding trial before d/c.  Altamese Orick, MD [facility], [facility] [phone]

## 2020-07-09 NOTE — Plan of Care (Signed)
  Problem: Pain Management: Goal: Pain level will decrease with appropriate interventions Outcome: Progressing   Problem: Skin Integrity: Goal: Will show signs of wound healing Outcome: Progressing   Problem: Activity: Goal: Ability to avoid complications of mobility impairment will improve Outcome: Progressing   Problem: Clinical Measurements: Goal: Respiratory complications will improve Outcome: Progressing   Problem: Clinical Measurements: Goal: Cardiovascular complication will be avoided Outcome: Progressing

## 2020-07-09 NOTE — Progress Notes (Signed)
Physical Therapy Treatment Patient Details Name: [name] MRN: [id number] DOB: September 05, 1955 Today's Date: 07/09/2020    History of Present Illness Patient is 64 y.o. female s/p Rt THA posterior approach on 07/08/20 with PMH significant for OA, depression, anxiety, HTN, GERD, fibromyalgia, Stroke, asthma.    PT Comments    Pt with severe hip pain today, and requires at least min-mod assist for bed mobility, transfers, and gait today. Pt with difficulty maintaining hip precautions, requires max multimodal cuing to maintain during mobility. PT to see pt for second session this afternoon, will continue to follow acutely.    Follow Up Recommendations  Follow surgeon's recommendation for DC plan and follow-up therapies;Home health PT;Supervision for mobility/OOB     Equipment Recommendations  Rolling walker with 5" wheels;3in1 (PT)    Recommendations for Other Services       Precautions / Restrictions Precautions Precautions: Fall;Posterior Hip Precaution Booklet Issued: Yes (comment) Precaution Comments: Reviewed no hip flexion >90*, no hip IR past neutral, no crossing legs, and no hip abd past neutral Restrictions Weight Bearing Restrictions: No RLE Weight Bearing: Weight bearing as tolerated    Mobility  Bed Mobility Overal bed mobility: Needs Assistance Bed Mobility: Supine to Sit     Supine to sit: Mod assist     General bed mobility comments: Mod assist for RLE lifting and translation to EOB,  trunk elevation, scooting to EOB with assist of bed pad. Very increased time, with multiple starts/stops due to pt pain. Verbal and tactile reinforcements to maintain posterior hip precautions.  Transfers Overall transfer level: Needs assistance Equipment used: Rolling walker (2 wheeled) Transfers: Sit to/from Stand Sit to Stand: Min assist;From elevated surface         General transfer comment: Min assist for initial power up, physically placing RLE out in front of  BOS during standing/sitting to maintain hip precautions. STS x2, from EOB and toilet, with verbal cuing for maintaining hip precautions and hand placement when rising/sitting.  Ambulation/Gait Ambulation/Gait assistance: Min assist Gait Distance (Feet): 10 Feet (x2) Assistive device: Rolling walker (2 wheeled) Gait Pattern/deviations: Step-to pattern;Decreased stride length;Decreased weight shift to right;Trunk flexed;Antalgic Gait velocity: decr   General Gait Details: Min assist to steady, physically progress RLE and maintain hip precautions. Verbal cuing for upright posture, placement in RW, out-toeing when turning RW towards R to maintain precautions.   Stairs             Wheelchair Mobility    Modified Rankin (Stroke Patients Only)       Balance Overall balance assessment: Needs assistance Sitting-balance support: Feet supported;Bilateral upper extremity supported Sitting balance-Leahy Scale: Fair     Standing balance support: During functional activity;Bilateral upper extremity supported Standing balance-Leahy Scale: Poor                              Cognition Arousal/Alertness: Awake/alert Behavior During Therapy: WFL for tasks assessed/performed Overall Cognitive Status: Within Functional Limits for tasks assessed                                        Exercises Total Joint Exercises Ankle Circles/Pumps: AROM;Right;10 reps;Supine Quad Sets: AROM;Right;5 reps;Supine    General Comments        Pertinent Vitals/Pain Pain Assessment: 0-10 Pain Score: 9  Pain Location: Rt hip Pain Descriptors / Indicators: Aching;Discomfort;Grimacing;Guarding;Crying Pain  Intervention(s): Limited activity within patient's tolerance;Monitored during session;Repositioned;Premedicated before session;Ice applied    Home Living                      Prior Function            PT Goals (current goals can now be found in the care plan  section) Acute Rehab PT Goals Patient Stated Goal: regain independence PT Goal Formulation: With patient Time For Goal Achievement: 07/15/20 Potential to Achieve Goals: Good Progress towards PT goals: Progressing toward goals    Frequency    7X/week      PT Plan Current plan remains appropriate    Co-evaluation              AM-PAC PT "6 Clicks" Mobility   Outcome Measure  Help needed turning from your back to your side while in a flat bed without using bedrails?: A Little Help needed moving from lying on your back to sitting on the side of a flat bed without using bedrails?: A Lot Help needed moving to and from a bed to a chair (including a wheelchair)?: A Little Help needed standing up from a chair using your arms (e.g., wheelchair or bedside chair)?: A Little Help needed to walk in hospital room?: A Little Help needed climbing 3-5 steps with a railing? : A Lot 6 Click Score: 16    End of Session Equipment Utilized During Treatment: Gait belt Activity Tolerance: Patient limited by pain Patient left: with call bell/phone within reach;in bed;with bed alarm set Nurse Communication: Mobility status PT Visit Diagnosis: Muscle weakness (generalized) (M62.81);Difficulty in walking, not elsewhere classified (R26.2);Pain Pain - Right/Left: Right Pain - part of body: Hip     Time: 0931-1000 PT Time Calculation (min) (ACUTE ONLY): 29 min  Charges:  $Gait Training: 8-22 mins $Therapeutic Activity: 8-22 mins                     Ceniya Fowers E, PT Acute Rehabilitation Services Pager [phone]  Office [phone]     Lamont Tant D Elonda Husky 07/09/2020, 11:37 AM

## 2020-07-09 NOTE — TOC Progression Note (Signed)
Transition of Care [facility]) - Progression Note    Patient Details  Name: [name] MRN: [id number] Date of Birth: 1956-08-04  Transition of Care [facility]) CM/SW Contact  Joaquin Courts, RN Phone Number: 07/09/2020, 9:53 AM  Clinical Narrative:   [employer] given referral for dme rolling walker and 3in1, equipment to be delivered to bedside for home use.  [facility] to provide HHPT services.    Expected Discharge Plan: [place] Barriers to Discharge: No Barriers Identified  Expected Discharge Plan and Services Expected Discharge Plan: [place]   Discharge Planning Services: CM Consult Post Acute Care Choice: [place] arrangements for the past 2 months: Single Family Home Expected Discharge Date: 07/09/20               DME Arranged: Berta Minor rolling DME Agency: [employer] Date DME Agency Contacted: 07/09/20 Time DME Agency Contacted: [phone] Representative spoke with at DME Agency: on-call referral line HH Arranged: PT [place]: [facility] [employer]) Date [place]: 07/09/20 Time [place]: [phone] Representative spoke with at [place]: pre-arranged in md office   Social Determinants of Health ([place]) Interventions    Readmission Risk Interventions No flowsheet data found.

## 2020-07-09 NOTE — Progress Notes (Addendum)
Physical Therapy Treatment Patient Details Name: [name] MRN: [id number] DOB: [birth year] Today's Date: 07/09/2020    History of Present Illness Patient is 64 y.o. female s/p Rt THA posterior approach on 07/08/20 with PMH significant for OA, depression, anxiety, HTN, GERD, fibromyalgia, Stroke, asthma.    PT Comments    Pt continues to be limited by severe R hip pain, specifically around proximal quadricep/groin. Pt requiring min-mod assist for short-distance ambulation, and needs help to physically progress and orient RLE. Pt with poor tolerance for LE exercises, all limited by pain. RN notified. PT to continue to follow acutely.    Follow Up Recommendations  Follow surgeon's recommendation for DC plan and follow-up therapies;Home health PT;Supervision for mobility/OOB     Equipment Recommendations  Rolling walker with 5" wheels;3in1 (PT)    Recommendations for Other Services       Precautions / Restrictions Precautions Precautions: Fall;Posterior Hip Precaution Booklet Issued: Yes (comment) Precaution Comments: Reviewed no hip flexion >90*, no hip IR past neutral, no crossing legs, and no hip abd past neutral - pt ableto state 1/4 precautions Restrictions RLE Weight Bearing: Weight bearing as tolerated    Mobility  Bed Mobility Overal bed mobility: Needs Assistance Bed Mobility: Supine to Sit;Sit to Supine     Supine to sit: Mod assist;HOB elevated Sit to supine: Mod assist;HOB elevated   General bed mobility comments: Mod assist for RLE lifting and translation to/from EOB,  trunk elevation/lowering, scooting to EOB with assist of bed pad. Verbal cuing for maintaining precautions  Transfers Overall transfer level: Needs assistance Equipment used: Rolling walker (2 wheeled) Transfers: Sit to/from Stand Sit to Stand: Min assist;From elevated surface         General transfer comment: Min assist for initial power up, physically placing RLE out in front of  BOS during standing/sitting to maintain hip precautions.  Ambulation/Gait Ambulation/Gait assistance: Mod assist Gait Distance (Feet): 20 Feet Assistive device: Rolling walker (2 wheeled) Gait Pattern/deviations: Step-to pattern;Decreased stride length;Decreased weight shift to right;Trunk flexed;Antalgic Gait velocity: decr   General Gait Details: mod assist for RW management, forward progression of RLE, physically out-toeing RLE during turning towards R.   Stairs             Wheelchair Mobility    Modified Rankin (Stroke Patients Only)       Balance Overall balance assessment: Needs assistance Sitting-balance support: Feet supported;Bilateral upper extremity supported Sitting balance-Leahy Scale: Fair     Standing balance support: During functional activity;Bilateral upper extremity supported Standing balance-Leahy Scale: Poor                              Cognition Arousal/Alertness: Awake/alert Behavior During Therapy: WFL for tasks assessed/performed;Anxious Overall Cognitive Status: Within Functional Limits for tasks assessed                                 General Comments: body-wide tremors, suspect secondary to pain and anxiety      Exercises Total Joint Exercises Ankle Circles/Pumps: AROM;Right;10 reps;Supine Quad Sets: AROM;Right;Supine;10 reps Heel Slides: AAROM;Right;10 reps;Supine    General Comments        Pertinent Vitals/Pain Pain Assessment: Faces Faces Pain Scale: Hurts worst Pain Location: Rt hip Pain Descriptors / Indicators: Discomfort;Grimacing;Crying;Sharp;Shooting Pain Intervention(s): Limited activity within patient's tolerance;Monitored during session;Repositioned;Premedicated before session;Relaxation    Home Living  Prior Function            PT Goals (current goals can now be found in the care plan section) Acute Rehab PT Goals Patient Stated Goal: regain  independence PT Goal Formulation: With patient Time For Goal Achievement: 07/15/20 Potential to Achieve Goals: Good Progress towards PT goals: Progressing toward goals    Frequency    7X/week      PT Plan Current plan remains appropriate    Co-evaluation              AM-PAC PT "6 Clicks" Mobility   Outcome Measure  Help needed turning from your back to your side while in a flat bed without using bedrails?: A Little Help needed moving from lying on your back to sitting on the side of a flat bed without using bedrails?: A Lot Help needed moving to and from a bed to a chair (including a wheelchair)?: A Little Help needed standing up from a chair using your arms (e.g., wheelchair or bedside chair)?: A Little Help needed to walk in hospital room?: A Lot Help needed climbing 3-5 steps with a railing? : A Lot 6 Click Score: 15    End of Session Equipment Utilized During Treatment: Gait belt Activity Tolerance: Patient limited by pain Patient left: with call bell/phone within reach;in bed;with bed alarm set;with SCD's reapplied Nurse Communication: Mobility status;Other (comment) (severe pain) PT Visit Diagnosis: Muscle weakness (generalized) (M62.81);Difficulty in walking, not elsewhere classified (R26.2);Pain Pain - Right/Left: Right Pain - part of body: Hip     Time: 1500-1520 PT Time Calculation (min) (ACUTE ONLY): 20 min  Charges:  $Gait Training: 8-22 mins                     Malley Hauter E, PT Acute Rehabilitation Services Pager [phone]  Office [phone]     Chavonne Sforza D Elonda Husky 07/09/2020, 3:35 PM

## 2020-07-09 NOTE — Op Note (Signed)
NAME: [name], [name] MEDICAL RECORD [id number] ACCOUNT [id number] DATE OF BIRTH:[birth year] FACILITY: [facility] LOCATION: [facility] PHYSICIAN:W. Romari Gasparro JR., MD  OPERATIVE REPORT  DATE OF PROCEDURE:  07/08/2020  PREOPERATIVE DIAGNOSIS:  Severe avascular necrosis, right hip.  POSTOPERATIVE DIAGNOSIS:  Severe avascular necrosis, right hip.  OPERATION:  Right total hip replacement (DePuy AML small stature stem, 15 mm with +5 mm 36 mm hip ball Biolox ceramic femoral head, +5 mm with 52 mm Pinnacle Gription cup with AltrX polyethylene liner +4 mm 10-degree.  SURGEON:  W. Valeta Harms., MD  ASSISTANTMarjo Bicker.  ANESTHESIA:  General anesthetic.  BLOOD LOSS:  500 mL.  DESCRIPTION OF PROCEDURE:  Lateral position with posterior approach to the hip made, splitting the iliotibial band and gluteus maximus fascia, split the short external rotators, did a T capsulotomy in the hip.  Cut the femoral head about one  fingerbreadth above the lesser trochanter.  Progressive broaching and reaming to accept a 15 mm small stature stem with a 0.5 mm under reaming.  Acetabular was approached with acetabular wing retractors as well as ____ inferior and anterior retractor,  redundant labrum was debrided.  We then progressively reamed the acetabulum to accept a 52 mm cup, to accept a 36 mm head, about 45 to 50 degrees of abduction, 15 degrees of anteversion.  Final cup was inserted with the trial liner with the posterior  buildup at about the 9 o'clock position for the right hip.  Progressive trial with the broaches in placed on the femur let us to accept the +5 mm neck length.  Final components were inserted, acetabular final liner followed by femoral stem.  We trialed  off the stem and again accepted the +5 mm.  Excellent stability was noted.  She could be maximally flexed, maximally adduct and internal rotation approaching to 90 degrees before there was any tendency to sublux.  T capsulotomy was closed  with Ethibond.   The fascia with a running Ethibond, subcutaneous tissue with Vicryl and the skin with a Monocryl stitch.  A lightly compressive sterile dressing and knee immobilizer applied, taken to recovery room in stable condition.  HN/NUANCE  D:07/08/2020 T:07/09/2020 JOB:[id number]

## 2020-07-10 DIAGNOSIS — M87051 Idiopathic aseptic necrosis of right femur: Secondary | ICD-10-CM | POA: Diagnosis not present

## 2020-07-10 NOTE — Plan of Care (Signed)
  Problem: Pain Management: Goal: Pain level will decrease with appropriate interventions Outcome: Progressing   Problem: Education: Goal: Knowledge of General Education information will improve Description: Including pain rating scale, medication(s)/side effects and non-pharmacologic comfort measures Outcome: Progressing   Problem: Clinical Measurements: Goal: Postoperative complications will be avoided or minimized Outcome: Progressing   Problem: Activity: Goal: Ability to avoid complications of mobility impairment will improve Outcome: Progressing

## 2020-07-10 NOTE — Progress Notes (Signed)
Physical Therapy Treatment Patient Details Name: [name] MRN: [id number] DOB: 06/14/1956 Today's Date: 07/10/2020    History of Present Illness Patient is 64 y.o. female s/p Rt THA posterior approach on 07/08/20 with PMH significant for OA, depression, anxiety, HTN, GERD, fibromyalgia, Stroke, asthma.    PT Comments    Pt is not progressing with mobility. Continues to report severe pain with all activity. She has difficulty advancing R LE during gait training. Will continue to follow and progress activity as able.    Follow Up Recommendations  Follow surgeon's recommendation for DC plan and follow-up therapies;Supervision for mobility/OOB (plan is for HHPT)     Equipment Recommendations  Rolling walker with 5" wheels;3in1 (PT)    Recommendations for Other Services       Precautions / Restrictions Precautions Precautions: Fall;Posterior Hip Precaution Comments: Reviewed hip precautions. Pt able to recall 2/3. Restrictions Weight Bearing Restrictions: No RLE Weight Bearing: Weight bearing as tolerated    Mobility  Bed Mobility Overal bed mobility: Needs Assistance Bed Mobility: Supine to Sit;Sit to Supine     Supine to sit: Min assist;HOB elevated Sit to supine: Mod assist   General bed mobility comments: Assist for R LE off bed and bil LEs onto bed. Increased time. Cues for safety, adherence to precautions  Transfers Overall transfer level: Needs assistance Equipment used: Rolling walker (2 wheeled) Transfers: Sit to/from Stand Sit to Stand: Min assist;From elevated surface         General transfer comment: Assist to power up, stabilize. Increased time. Assist to position R LE to adhere to precautions.  Ambulation/Gait Ambulation/Gait assistance: Min assist Gait Distance (Feet): 20 Feet Assistive device: Rolling walker (2 wheeled) Gait Pattern/deviations: Step-to pattern;Decreased stride length;Decreased weight shift to right;Trunk  flexed;Antalgic;Decreased step length - right     General Gait Details: Assist to steady pt. Pt insisted on using gait belt to help advance her R LE. Pt was only able to tolerate short distance 2* pain   Stairs             Wheelchair Mobility    Modified Rankin (Stroke Patients Only)       Balance Overall balance assessment: Needs assistance         Standing balance support: During functional activity;Bilateral upper extremity supported Standing balance-Leahy Scale: Poor                              Cognition Arousal/Alertness: Awake/alert Behavior During Therapy: WFL for tasks assessed/performed;Anxious Overall Cognitive Status: Within Functional Limits for tasks assessed                                        Exercises Total Joint Exercises Ankle Circles/Pumps: AAROM;Both;10 reps Quad Sets: AROM;Both;10 reps Heel Slides: AAROM;Right;10 reps    General Comments        Pertinent Vitals/Pain Pain Assessment: 0-10 Pain Score: 9  Pain Location: R hip Pain Descriptors / Indicators: Discomfort;Grimacing;Sharp;Aching Pain Intervention(s): Monitored during session;Repositioned;Limited activity within patient's tolerance;Ice applied    Home Living                      Prior Function            PT Goals (current goals can now be found in the care plan section) Progress towards PT goals: Progressing toward goals  Frequency    7X/week      PT Plan Current plan remains appropriate    Co-evaluation              AM-PAC PT "6 Clicks" Mobility   Outcome Measure  Help needed turning from your back to your side while in a flat bed without using bedrails?: A Lot Help needed moving from lying on your back to sitting on the side of a flat bed without using bedrails?: A Lot Help needed moving to and from a bed to a chair (including a wheelchair)?: A Little   Help needed to walk in hospital room?: A Lot Help  needed climbing 3-5 steps with a railing? : A Lot 6 Click Score: 11    End of Session Equipment Utilized During Treatment: Gait belt Activity Tolerance: Patient limited by pain Patient left: in bed;with call bell/phone within reach   PT Visit Diagnosis: Muscle weakness (generalized) (M62.81);Difficulty in walking, not elsewhere classified (R26.2);Pain Pain - Right/Left: Right Pain - part of body: Hip     Time: 8832-5498 PT Time Calculation (min) (ACUTE ONLY): 31 min  Charges:  $Gait Training: 8-22 mins $Therapeutic Exercise: 8-22 mins                        Doreatha Massed, PT Acute Rehabilitation  Office: [phone] Pager: [phone]

## 2020-07-10 NOTE — Progress Notes (Signed)
Orthopaedic Trauma Service (OTS)  2 Days Post-Op Procedure(s) (LRB): TOTAL HIP ARTHROPLASTY (Right)  Subjective: Patient reports pain as severe and no different than yesterday. After planning foley insertion patient suddenly able to void without further difficulty.    Objective: Current Vitals Blood pressure 126/71, pulse 96, temperature 99 F (37.2 C), temperature source Oral, resp. rate 20, height 5\' 7"  (1.702 m), weight 81.3 kg, last menstrual period 12/13/2015, SpO2 97 %. Vital signs in last 24 hours: Temp:  [98.2 F (36.8 C)-99 F (37.2 C)] 99 F (37.2 C) (11/21 0615) Pulse Rate:  [79-96] 96 (11/21 0615) Resp:  [16-20] 20 (11/21 0615) BP: (126-134)/(71-78) 126/71 (11/21 0615) SpO2:  [94 %-98 %] 97 % (11/21 0820)  Intake/Output from previous day: 11/20 0701 - 11/21 0700 In: 1802.3 [P.O.:1680; IV Piggyback:122.3] Out: 2600 [Urine:2600]  LABS No results for input(s): HGB in the last 72 hours. No results for input(s): WBC, RBC, HCT, PLT in the last 72 hours. No results for input(s): NA, K, CL, CO2, BUN, CREATININE, GLUCOSE, CALCIUM in the last 72 hours. No results for input(s): LABPT, INR in the last 72 hours.  X-rays: Perfect position without any fracture or complication s/p right AML THA  Physical Exam  RLE  Dressing intact, clean, dry  Edema/ swelling controlled  Sens: DPN, SPN, TN intact  Motor: EHL, FHL, and lessor toe ext and flex all intact grossly  Brisk cap refill, warm to touch, no edema or calf tenderness  Assessment/Plan: 2 Days Post-Op Procedure(s) (LRB): TOTAL HIP ARTHROPLASTY (Right) 1. PT/OT  2. DVT proph ECASA 3. F/u 8-14 days  [place], MD [facility], [facility] [phone]

## 2020-07-10 NOTE — Anesthesia Postprocedure Evaluation (Signed)
Anesthesia Post Note  Patient: [name]  Procedure(s) Performed: TOTAL HIP ARTHROPLASTY (Right Hip)     Patient location during evaluation: PACU Anesthesia Type: General Level of consciousness: awake and alert Pain management: pain level controlled Vital Signs Assessment: post-procedure vital signs reviewed and stable Respiratory status: spontaneous breathing, nonlabored ventilation, respiratory function stable and patient connected to nasal cannula oxygen Cardiovascular status: blood pressure returned to baseline and stable Postop Assessment: no apparent nausea or vomiting Anesthetic complications: no   No complications documented.  Last Vitals:  Vitals:   07/10/20 1118 07/10/20 1943  BP: 110/72   Pulse: 100   Resp: 18   Temp: 36.7 C   SpO2: 98% 96%    Last Pain:  Vitals:   07/10/20 1809  TempSrc:   PainSc: 4                  [name]

## 2020-07-10 NOTE — Progress Notes (Signed)
Physical Therapy Treatment Patient Details Name: [name] MRN: [id number] DOB: Sep 19, 1955 Today's Date: 07/10/2020    History of Present Illness Patient is 64 y.o. female s/p Rt THA posterior approach on 07/08/20 with PMH significant for OA, depression, anxiety, HTN, GERD, fibromyalgia, Stroke, asthma.    PT Comments    Progressing very slowly. Pt is still experiencing significant pain with all mobility. She was able to advance R LE some this session but it was effortful. Will continue to follow and progress activity as tolerated.    Follow Up Recommendations  Follow surgeons recommendation for DC plan and follow-up therapies;Supervision for mobility/OOB (plan is for HHPT)     Equipment Recommendations  Rolling walker with 5" wheels;3in1 (PT)    Recommendations for Other Services       Precautions / Restrictions Precautions Precautions: Fall;Posterior Hip Precaution Comments: Reviewed hip precautions Restrictions Weight Bearing Restrictions: No RLE Weight Bearing: Weight bearing as tolerated    Mobility  Bed Mobility Overal bed mobility: Needs Assistance Bed Mobility: Supine to Sit;Sit to Supine     Supine to sit: Min assist;HOB elevated Sit to supine: Mod assist   General bed mobility comments: Assist for R LE off bed and bil LEs onto bed. Increased time. Cues for safety, adherence to precautions  Transfers Overall transfer level: Needs assistance Equipment used: Rolling walker (2 wheeled) Transfers: Sit to/from Stand Sit to Stand: Min assist;From elevated surface         General transfer comment: Assist to power up, stabilize. Increased time. Assist to position R LE to adhere to precautions.  Ambulation/Gait Ambulation/Gait assistance: Min assist;Min guard Gait Distance (Feet): 25 Feet Assistive device: Rolling walker (2 wheeled) Gait Pattern/deviations: Step-to pattern;Decreased stride length;Decreased weight shift to right;Trunk  flexed;Antalgic;Decreased step length - right     General Gait Details: Assist to advance R LE during ambulation. Pt able to begin advancing without assistance after ~15 feet. Very slow gait speed. Distance limited due to pain.   Stairs             Wheelchair Mobility    Modified Rankin (Stroke Patients Only)       Balance Overall balance assessment: Needs assistance         Standing balance support: Bilateral upper extremity supported Standing balance-Leahy Scale: Poor                              Cognition Arousal/Alertness: Awake/alert Behavior During Therapy: WFL for tasks assessed/performed Overall Cognitive Status: Within Functional Limits for tasks assessed                                        Exercises Total Joint Exercises Ankle Circles/Pumps: AAROM;Both;10 reps Quad Sets: AROM;Both;10 reps Heel Slides: AAROM;Right;5 reps    General Comments        Pertinent Vitals/Pain Pain Assessment: Faces Pain Score: 9  Faces Pain Scale: Hurts whole lot Pain Location: R hip Pain Descriptors / Indicators: Discomfort;Grimacing;Sharp;Aching;Moaning Pain Intervention(s): Limited activity within patient's tolerance;Monitored during session;Ice applied;Repositioned    Home Living                      Prior Function            PT Goals (current goals can now be found in the care plan section) Progress towards PT goals:  Progressing toward goals    Frequency    7X/week      PT Plan Current plan remains appropriate    Co-evaluation              AM-PAC PT "6 Clicks" Mobility   Outcome Measure  Help needed turning from your back to your side while in a flat bed without using bedrails?: A Lot Help needed moving from lying on your back to sitting on the side of a flat bed without using bedrails?: A Little Help needed moving to and from a bed to a chair (including a wheelchair)?: A Little Help needed standing  up from a chair using your arms (e.g., wheelchair or bedside chair)?: A Little Help needed to walk in hospital room?: A Little Help needed climbing 3-5 steps with a railing? : A Lot 6 Click Score: 16    End of Session Equipment Utilized During Treatment: Gait belt Activity Tolerance: Patient limited by pain Patient left: in bed;with call bell/phone within reach   PT Visit Diagnosis: Muscle weakness (generalized) (M62.81);Difficulty in walking, not elsewhere classified (R26.2);Pain Pain - Right/Left: Right Pain - part of body: Hip     Time: 0174-9449 PT Time Calculation (min) (ACUTE ONLY): 26 min  Charges:  $Gait Training: 23-37 mins                        Doreatha Massed, PT Acute Rehabilitation  Office: [phone] Pager: [phone]

## 2020-07-11 ENCOUNTER — Encounter (HOSPITAL_COMMUNITY): Payer: Self-pay | Admitting: Orthopedic Surgery

## 2020-07-11 DIAGNOSIS — M87051 Idiopathic aseptic necrosis of right femur: Secondary | ICD-10-CM | POA: Diagnosis not present

## 2020-07-11 MED ORDER — APIXABAN 2.5 MG PO TABS: 2.5000 mg | ORAL_TABLET | Freq: Two times a day (BID) | ORAL | 0 refills | Status: DC

## 2020-07-11 NOTE — Plan of Care (Signed)
  Problem: Education: Goal: Knowledge of the prescribed therapeutic regimen will improve Outcome: Progressing Goal: Understanding of discharge needs will improve Outcome: Progressing Goal: Individualized Educational Video(s) Outcome: Progressing   Problem: Activity: Goal: Ability to avoid complications of mobility impairment will improve Outcome: Progressing Goal: Ability to tolerate increased activity will improve Outcome: Progressing   Problem: Clinical Measurements: Goal: Postoperative complications will be avoided or minimized Outcome: Progressing   Problem: Pain Management: Goal: Pain level will decrease with appropriate interventions Outcome: Progressing   Problem: Skin Integrity: Goal: Will show signs of wound healing Outcome: Progressing   Problem: Education: Goal: Knowledge of General Education information will improve Description: Including pain rating scale, medication(s)/side effects and non-pharmacologic comfort measures Outcome: Progressing   Problem: Health Behavior/Discharge Planning: Goal: Ability to manage health-related needs will improve Outcome: Progressing   Problem: Clinical Measurements: Goal: Ability to maintain clinical measurements within normal limits will improve Outcome: Progressing Goal: Will remain free from infection Outcome: Progressing Goal: Diagnostic test results will improve Outcome: Progressing Goal: Respiratory complications will improve Outcome: Progressing Goal: Cardiovascular complication will be avoided Outcome: Progressing   Problem: Activity: Goal: Risk for activity intolerance will decrease Outcome: Progressing   Problem: Coping: Goal: Level of anxiety will decrease Outcome: Progressing   Problem: Elimination: Goal: Will not experience complications related to bowel motility Outcome: Progressing Goal: Will not experience complications related to urinary retention Outcome: Progressing   Problem: Pain  Managment: Goal: General experience of comfort will improve Outcome: Progressing   Problem: Safety: Goal: Ability to remain free from injury will improve Outcome: Progressing   Problem: Skin Integrity: Goal: Risk for impaired skin integrity will decrease Outcome: Progressing

## 2020-07-11 NOTE — Discharge Instructions (Signed)
INSTRUCTIONS AFTER JOINT REPLACEMENT  ° °o Remove items at home which could result in a fall. This includes throw rugs or furniture in walking pathways °o ICE to the affected joint every three hours while awake for 30 minutes at a time, for at least the first 3-5 days, and then as needed for pain and swelling.  Continue to use ice for pain and swelling. You may notice swelling that will progress down to the foot and ankle.  This is normal after surgery.  Elevate your leg when you are not up walking on it.   °o Continue to use the breathing machine you got in the hospital (incentive spirometer) which will help keep your temperature down.  It is common for your temperature to cycle up and down following surgery, especially at night when you are not up moving around and exerting yourself.  The breathing machine keeps your lungs expanded and your temperature down. ° ° °DIET:  As you were doing prior to hospitalization, we recommend a well-balanced diet. ° °DRESSING / WOUND CARE / SHOWERING ° °Keep the surgical dressing until follow up.  The dressing is water proof, so you can shower without any extra covering.  IF THE DRESSING FALLS OFF or the wound gets wet inside, change the dressing with sterile gauze.  Please use good hand washing techniques before changing the dressing.  Do not use any lotions or creams on the incision until instructed by your surgeon.   ° °ACTIVITY ° °o Increase activity slowly as tolerated, but follow the weight bearing instructions below.   °o No driving for 6 weeks or until further direction given by your physician.  You cannot drive while taking narcotics.  °o No lifting or carrying greater than 10 lbs. until further directed by your surgeon. °o Avoid periods of inactivity such as sitting longer than an hour when not asleep. This helps prevent blood clots.  °o You may return to work once you are authorized by your doctor.  ° ° ° °WEIGHT BEARING  ° °Weight bearing as tolerated with assist  device (walker, cane, etc) as directed, use it as long as suggested by your surgeon or therapist, typically at least 4-6 weeks. ° ° °EXERCISES ° °Results after joint replacement surgery are often greatly improved when you follow the exercise, range of motion and muscle strengthening exercises prescribed by your doctor. Safety measures are also important to protect the joint from further injury. Any time any of these exercises cause you to have increased pain or swelling, decrease what you are doing until you are comfortable again and then slowly increase them. If you have problems or questions, call your caregiver or physical therapist for advice.  ° °Rehabilitation is important following a joint replacement. After just a few days of immobilization, the muscles of the leg can become weakened and shrink (atrophy).  These exercises are designed to build up the tone and strength of the thigh and leg muscles and to improve motion. Often times heat used for twenty to thirty minutes before working out will loosen up your tissues and help with improving the range of motion but do not use heat for the first two weeks following surgery (sometimes heat can increase post-operative swelling).  ° °These exercises can be done on a training (exercise) mat, on the floor, on a table or on a bed. Use whatever works the best and is most comfortable for you.    Use music or television while you are exercising so that   the exercises are a pleasant break in your day. This will make your life better with the exercises acting as a break in your routine that you can look forward to.   Perform all exercises about fifteen times, three times per day or as directed.  You should exercise both the operative leg and the other leg as well. ° °Exercises include: °  °• Quad Sets - Tighten up the muscle on the front of the thigh (Quad) and hold for 5-10 seconds.   °• Straight Leg Raises - With your knee straight (if you were given a brace, keep it on),  lift the leg to 60 degrees, hold for 3 seconds, and slowly lower the leg.  Perform this exercise against resistance later as your leg gets stronger.  °• Leg Slides: Lying on your back, slowly slide your foot toward your buttocks, bending your knee up off the floor (only go as far as is comfortable). Then slowly slide your foot back down until your leg is flat on the floor again.  °• Angel Wings: Lying on your back spread your legs to the side as far apart as you can without causing discomfort.  °• Hamstring Strength:  Lying on your back, push your heel against the floor with your leg straight by tightening up the muscles of your buttocks.  Repeat, but this time bend your knee to a comfortable angle, and push your heel against the floor.  You may put a pillow under the heel to make it more comfortable if necessary.  ° °A rehabilitation program following joint replacement surgery can speed recovery and prevent re-injury in the future due to weakened muscles. Contact your doctor or a physical therapist for more information on knee rehabilitation.  ° ° °CONSTIPATION ° °Constipation is defined medically as fewer than three stools per week and severe constipation as less than one stool per week.  Even if you have a regular bowel pattern at home, your normal regimen is likely to be disrupted due to multiple reasons following surgery.  Combination of anesthesia, postoperative narcotics, change in appetite and fluid intake all can affect your bowels.  ° °YOU MUST use at least one of the following options; they are listed in order of increasing strength to get the job done.  They are all available over the counter, and you may need to use some, POSSIBLY even all of these options:   ° °Drink plenty of fluids (prune juice may be helpful) and high fiber foods °Colace 100 mg by mouth twice a day  °Senokot for constipation as directed and as needed Dulcolax (bisacodyl), take with full glass of water  °Miralax (polyethylene glycol)  once or twice a day as needed. ° °If you have tried all these things and are unable to have a bowel movement in the first 3-4 days after surgery call either your surgeon or your primary doctor.   ° °If you experience loose stools or diarrhea, hold the medications until you stool forms back up.  If your symptoms do not get better within 1 week or if they get worse, check with your doctor.  If you experience "the worst abdominal pain ever" or develop nausea or vomiting, please contact the office immediately for further recommendations for treatment. ° ° °ITCHING:  If you experience itching with your medications, try taking only a single pain pill, or even half a pain pill at a time.  You can also use Benadryl over the counter for itching or also to   help with sleep.   TED HOSE STOCKINGS:  Use stockings on both legs until for at least 2 weeks or as directed by physician office. They may be removed at night for sleeping.  MEDICATIONS:  See your medication summary on the After Visit Summary that nursing will review with you.  You may have some home medications which will be placed on hold until you complete the course of blood thinner medication.  It is important for you to complete the blood thinner medication as prescribed.  You have been prescribed Eliquis a twice a day blood thinner to take for 30 days then transition to your baby aspirin twice a day for 2 additional weeks after completion.  PRECAUTIONS:  If you experience chest pain or shortness of breath - call 911 immediately for transfer to the hospital emergency department.   If you develop a fever greater that 101 F, purulent drainage from wound, increased redness or drainage from wound, foul odor from the wound/dressing, or calf pain - CONTACT YOUR SURGEON.                                                   FOLLOW-UP APPOINTMENTS:  If you do not already have a post-op appointment, please call the office for an appointment to be seen by your  surgeon.  Guidelines for how soon to be seen are listed in your After Visit Summary, but are typically between 1-4 weeks after surgery.  OTHER INSTRUCTIONS:   Knee Replacement:  Do not place pillow under knee, focus on keeping the knee straight while resting. CPM instructions: 0-90 degrees, 2 hours in the morning, 2 hours in the afternoon, and 2 hours in the evening. Place foam block, curve side up under heel at all times except when in CPM or when walking.  DO NOT modify, tear, cut, or change the foam block in any way.   DENTAL ANTIBIOTICS:  In most cases prophylactic antibiotics for Dental procdeures after total joint surgery are not necessary.  Exceptions are as follows:  1. History of prior total joint infection  2. Severely immunocompromised (Organ Transplant, cancer chemotherapy, Rheumatoid biologic meds such as Ensign)  3. Poorly controlled diabetes (A1C &gt; 8.0, blood glucose over 200)  If you have one of these conditions, contact your surgeon for an antibiotic prescription, prior to your dental procedure.   MAKE SURE YOU:   Understand these instructions.   Get help right away if you are not doing well or get worse.    Thank you for letting us be a part of your medical care team.  It is a privilege we respect greatly.  We hope these instructions will help you stay on track for a fast and full recovery!

## 2020-07-11 NOTE — Discharge Summary (Signed)
PATIENT ID: [name]        MRN:  [id number]          DOB/AGE: 26-Sep-1955 / 64 y.o.    DISCHARGE SUMMARY  ADMISSION DATE:    07/08/2020 DISCHARGE DATE:   07/11/2020   ADMISSION DIAGNOSIS: S/P total right hip arthroplasty [Z96.641]    DISCHARGE DIAGNOSIS:  OA RIGHT HIP    ADDITIONAL DIAGNOSIS: Active Problems:   S/P total right hip arthroplasty  Past Medical History:  Diagnosis Date  . Abnormal Pap smear of cervix   . Anxiety   . Arthritis   . Asthma   . Depression   . Essential hypertension   . Fibromyalgia   . GERD (gastroesophageal reflux disease)   . History of stroke   . PVC's (premature ventricular contractions)   . Sciatica of right side   . Stroke ([place])    STROKE IN RIGHT EYE 20 YEAS AGO O PROBLEMS SINCE     PROCEDURE: Procedure(s): TOTAL HIP ARTHROPLASTY Right on 07/08/2020  CONSULTS: PT    HISTORY:  See H&P in chart  HOSPITAL COURSE:  [name] is a 64 y.o. admitted on 07/08/2020 and found to have a diagnosis of OA RIGHT HIP.  After appropriate laboratory studies were obtained  they were taken to the operating room on 07/08/2020 and underwent  Procedure(s): TOTAL HIP ARTHROPLASTY  Right.   They were given perioperative antibiotics:  Anti-infectives (From admission, onward)   Start     Dose/Rate Route Frequency Ordered Stop   07/08/20 1800  vancomycin (VANCOCIN) IVPB 1000 mg/200 mL premix        1,000 mg 200 mL/hr over 60 Minutes Intravenous Every 12 hours 07/08/20 1716 07/09/20 0636   07/08/20 0745  ceFAZolin (ANCEF) IVPB 2g/100 mL premix        2 g 200 mL/hr over 30 Minutes Intravenous On call to O.R. 07/08/20 0744 07/08/20 1159    .  Tolerated the procedure well.    POD #1, allowed out of bed to a chair.  PT for ambulation and exercise program.  IV saline locked.  O2 discontionued.  Pain control problems and slow to progress with PT.  POD #2, continued PT and ambulation. Continued pain control concerns, slow progression  with PT.  The remainder of the hospital course was dedicated to ambulation and strengthening.   The patient was discharged on 3 Days Post-Op in  Stable condition.  Blood products given:none  DIAGNOSTIC STUDIES: Recent vital signs:  Patient Vitals for the past 24 hrs:  BP Temp Temp src Pulse Resp SpO2  07/11/20 0800 -- -- -- -- -- 94 %  07/11/20 0542 124/70 98.3 F (36.8 C) Oral 90 15 92 %  07/10/20 2128 132/70 (!) 100.8 F (38.2 C) Oral 100 16 91 %  07/10/20 1943 -- -- -- -- -- 96 %  07/10/20 1118 110/72 98 F (36.7 C) Oral 100 18 98 %       Recent laboratory studies: No results for input(s): WBC, HGB, HCT, PLT in the last 168 hours. No results for input(s): NA, K, CL, CO2, BUN, CREATININE, GLUCOSE, CALCIUM in the last 168 hours. No results found for: INR, PROTIME   Recent Radiographic Studies :  DG Pelvis Portable  Result Date: 07/08/2020 CLINICAL DATA:  Status post right hip arthroplasty EXAM: PORTABLE PELVIS 1-2 VIEWS COMPARISON:  October 07, 2008 FINDINGS: The patient is status post right hip replacement. The distal tip of the femoral component is not  visualized. Within visualized limits, the femoral and acetabular components are in good position. A small amount of soft tissue gas is likely from recent replacement. IMPRESSION: Right hip replacement as above. Electronically Signed   By: Dorise Bullion III M.D   On: 07/08/2020 16:30   DG Hip Port Unilat With Pelvis 1V Right  Result Date: 07/08/2020 CLINICAL DATA:  Status post right hip replacement EXAM: DG HIP (WITH OR WITHOUT PELVIS) 1V PORT RIGHT COMPARISON:  None. FINDINGS: The patient is status post right hip replacement. The acetabular and femoral components are in good position on the single frontal view. IMPRESSION: Right hip replacement as above. Electronically Signed   By: Dorise Bullion III M.D   On: 07/08/2020 16:30   DG HIP UNILAT WITH PELVIS 2-3 VIEWS RIGHT  Result Date: 07/09/2020 CLINICAL DATA:  Post right  hip arthroplasty EXAM: DG HIP (WITH OR WITHOUT PELVIS) 2-3V RIGHT COMPARISON:  07/08/2020 FINDINGS: Evidence of patient's recent right hip arthroplasty intact and unchanged. Expected postoperative changes over the adjacent soft tissues of the right hip. Remainder the exam is unchanged. IMPRESSION: Right hip arthroplasty unchanged. Electronically Signed   By: Marin Olp M.D.   On: 07/09/2020 11:53    DISCHARGE INSTRUCTIONS:   DISCHARGE MEDICATIONS:   Allergies as of 07/11/2020      Reactions   Lidocaine Rash, Other (See Comments)   Required ED visit; 12/22/19 stated she isn't allergic to lidocaine anymore   Other Other (See Comments)   Unknown what is causing her to have allergic reactions.  Reaction started while at dentist receiving lidocaine 2% with epi.      Medication List    STOP taking these medications   meloxicam 15 MG tablet Commonly known as: MOBIC     TAKE these medications   ALPRAZolam 1 MG tablet Commonly known as: XANAX Take 1 mg by mouth 4 (four) times daily as needed for anxiety.   amLODipine 5 MG tablet Commonly known as: NORVASC Take 5 mg by mouth daily.   apixaban 2.5 MG Tabs tablet Commonly known as: Eliquis Take 1 tablet (2.5 mg total) by mouth 2 (two) times daily.   budesonide-formoterol 160-4.5 MCG/ACT inhaler Commonly known as: SYMBICORT Inhale 2 puffs into the lungs 2 (two) times daily.   diphenhydrAMINE 25 mg capsule Commonly known as: BENADRYL Take 25 mg by mouth every 8 (eight) hours as needed for itching or allergies.   EpiPen 2-Pak 0.3 mg/0.3 mL Soaj injection Generic drug: EPINEPHrine Inject 0.3 mg into the muscle as needed for anaphylaxis.   furosemide 40 MG tablet Commonly known as: LASIX Take 40 mg by mouth daily as needed for fluid.   guaiFENesin 600 MG 12 hr tablet Commonly known as: MUCINEX Take 1,200 mg by mouth 2 (two) times daily.   lansoprazole 30 MG capsule Commonly known as: PREVACID Take 1 capsule (30 mg total) by  mouth 2 (two) times daily before a meal.   methocarbamol 750 MG tablet Commonly known as: ROBAXIN Take 750 mg by mouth every 6 (six) hours as needed for muscle spasms.   oxyCODONE 5 MG immediate release tablet Commonly known as: Oxy IR/ROXICODONE Take one tab po q4-6hrs prn pain, may need 1-2 first couple days, max 8 tabs in 24hrs.   pantoprazole 40 MG tablet Commonly known as: PROTONIX Take 1 tablet (40 mg total) by mouth 2 (two) times daily before a meal.   PARoxetine 20 MG tablet Commonly known as: PAXIL Take 20 mg by mouth at bedtime.  phentermine 37.5 MG tablet Commonly known as: ADIPEX-P TAKE ONE-HALF TO ONE TABLET EVERY 3 DAYSFOR FATIGUE   ProAir HFA 108 (90 Base) MCG/ACT inhaler Generic drug: albuterol Inhale 2 puffs into the lungs as needed for wheezing or shortness of breath.   topiramate 200 MG tablet Commonly known as: TOPAMAX Take 200 mg by mouth 2 (two) times daily.            Durable Medical Equipment  (From admission, onward)         Start     Ordered   07/09/20 0950  For home use only DME 3 n 1  Once        07/09/20 0949   07/08/20 1524  DME Walker rolling  Once       Question Answer Comment  Walker: With [place] Wheels   Patient needs a walker to treat with the following condition Degenerative joint disease of right hip      07/08/20 1523          FOLLOW UP VISIT:    Follow-up Information    Earlie Server, MD. Schedule an appointment as soon as possible for a visit in 2 weeks.   Specialty: Orthopedic Surgery Contact information: [address] [place] [address] [phone]        [facility] Follow up.   Specialty: Home Health Services Why: agency will provide home health physical therapy. Contact information: [address] [place] [address] [phone]               DISPOSITION:   Home  CONDITION:  Stable   Chriss Czar, PA-C  07/11/2020 8:54 AM

## 2020-07-11 NOTE — Progress Notes (Signed)
Physical Therapy Treatment Patient Details Name: [name] MRN: [id number] DOB: [birth year] Today's Date: 07/11/2020    History of Present Illness Patient is 64 y.o. female s/p Rt THA posterior approach on 07/08/20 with PMH significant for OA, depression, anxiety, HTN, GERD, fibromyalgia, Stroke, asthma.    PT Comments    Reviewed/practiced gait training and stair training. Husband present to observe session. Pt continues to have significant pain with all mobility. Made RN aware of this x 2 on today. Pt is frustrated and concerned about pain control, as am I. Pt has decided that she will go ahead and d/c home on today.     Follow Up Recommendations  Follow surgeon's recommendation for DC plan and follow-up therapies;Supervision for mobility/OOB (plan is for HHPT)     Equipment Recommendations  Rolling walker with 5" wheels;3in1 (PT)    Recommendations for Other Services       Precautions / Restrictions Precautions Precautions: Fall;Posterior Hip Precaution Comments: Reviewed hip precautions. Issued precaution handout Restrictions Weight Bearing Restrictions: No RLE Weight Bearing: Weight bearing as tolerated    Mobility  Bed Mobility Overal bed mobility: Needs Assistance Bed Mobility: Supine to Sit     Supine to sit: Min assist;HOB elevated     General bed mobility comments: Assist for R LE off bed and bil LEs onto bed. Increased time. Cues for safety, adherence to precautions.  Transfers Overall transfer level: Needs assistance Equipment used: Rolling walker (2 wheeled) Transfers: Sit to/from Stand Sit to Stand: Min guard         General transfer comment: Min guard for safety. Increased time. Assist to position R LE to adhere to precautions.  Ambulation/Gait Ambulation/Gait assistance: Min guard Gait Distance (Feet): 20 Feet Assistive device: Rolling walker (2 wheeled) Gait Pattern/deviations: Step-to pattern;Decreased stride length;Decreased weight  shift to right;Trunk flexed;Antalgic;Decreased step length - right     General Gait Details: Assist to advance R LE during ambulation intermittently. Very slow gait speed. Gait is effortful. Distance limited due to pain.   Stairs Stairs: Yes Stairs assistance: Min assist Stair Management: Backwards;With walker;Step to pattern Number of Stairs: 1 General stair comments: VCs safety, technique, sequence. Assist to stabilize pt and to move R LE forward when descending step.   Wheelchair Mobility    Modified Rankin (Stroke Patients Only)       Balance Overall balance assessment: Needs assistance         Standing balance support: Bilateral upper extremity supported Standing balance-Leahy Scale: Poor                              Cognition Arousal/Alertness: Awake/alert Behavior During Therapy: WFL for tasks assessed/performed Overall Cognitive Status: Within Functional Limits for tasks assessed                                        Exercises      General Comments        Pertinent Vitals/Pain Pain Assessment: Faces Faces Pain Scale: Hurts whole lot Pain Location: R hip Pain Descriptors / Indicators: Discomfort;Grimacing;Sharp;Aching;Moaning Pain Intervention(s): Limited activity within patient's tolerance;Monitored during session;Repositioned    Home Living                      Prior Function            PT Goals (  current goals can now be found in the care plan section) Progress towards PT goals: Progressing toward goals    Frequency    7X/week      PT Plan Current plan remains appropriate    Co-evaluation              AM-PAC PT "6 Clicks" Mobility   Outcome Measure  Help needed turning from your back to your side while in a flat bed without using bedrails?: A Lot Help needed moving from lying on your back to sitting on the side of a flat bed without using bedrails?: A Little Help needed moving to and  from a bed to a chair (including a wheelchair)?: A Little Help needed standing up from a chair using your arms (e.g., wheelchair or bedside chair)?: A Little Help needed to walk in hospital room?: A Little Help needed climbing 3-5 steps with a railing? : A Lot 6 Click Score: 16    End of Session Equipment Utilized During Treatment: Gait belt Activity Tolerance: Patient limited by pain Patient left: in bed;with call bell/phone within reach;with family/visitor present (sitting EOB with husband helping her get dressed) Nurse Communication:  (severe pain) PT Visit Diagnosis: Muscle weakness (generalized) (M62.81);Difficulty in walking, not elsewhere classified (R26.2);Pain Pain - Right/Left: Right Pain - part of body: Hip     Time: 4196-2229 PT Time Calculation (min) (ACUTE ONLY): 19 min  Charges:  $Gait Training: 8-22 mins                         Doreatha Massed, PT Acute Rehabilitation  Office: [phone] Pager: [phone]

## 2020-07-11 NOTE — Progress Notes (Signed)
Pt provided with d/c instructions. After discussing the pt's plan of care upon d/c home, pt denied any further questions or concerns.  

## 2020-07-11 NOTE — Progress Notes (Signed)
Physical Therapy Treatment Patient Details Name: [name] MRN: [id number] DOB: 05/05/1956 Today's Date: 07/11/2020    History of Present Illness Patient is 64 y.o. female s/p Rt THA posterior approach on 07/08/20 with PMH significant for OA, depression, anxiety, HTN, GERD, fibromyalgia, Stroke, asthma.    PT Comments    Pt continues to have significant pain with all mobility tasks-made RN aware. RN stated she made PA aware this a.m. Pt has only been able to tolerate walking ~20 feet before needing to sit down. Her ability to advance her R LE during gait is slowly improving but her ambulation distance is so limited by pain that she gets very little gait training. Reviewed hip precautions with pt again on today-she was unable to recall any of them. PA has discharged pt to home on today per pt/chart. Will plan to have a 2nd session to continue gait and stair training.     Follow Up Recommendations  Follow surgeon's recommendation for DC plan and follow-up therapies;Supervision for mobility/OOB (plan is for HHPT)     Equipment Recommendations  Rolling walker with 5" wheels;3in1 (PT)    Recommendations for Other Services       Precautions / Restrictions Precautions Precautions: Fall;Posterior Hip Precaution Comments: Reviewed hip precautions. Pt unable to recall any precautions. Restrictions Weight Bearing Restrictions: No RLE Weight Bearing: Weight bearing as tolerated    Mobility  Bed Mobility Overal bed mobility: Needs Assistance Bed Mobility: Supine to Sit     Supine to sit: Min assist;HOB elevated     General bed mobility comments: Assist for R LE off bed and bil LEs onto bed. Increased time. Cues for safety, adherence to precautions.  Transfers Overall transfer level: Needs assistance Equipment used: Rolling walker (2 wheeled) Transfers: Sit to/from Stand Sit to Stand: Min guard         General transfer comment: Min guard for safety. Increased time.  Assist to position R LE to adhere to precautions.  Ambulation/Gait Ambulation/Gait assistance: Min guard Gait Distance (Feet): 20 Feet (20'x1; 15'x1) Assistive device: Rolling walker (2 wheeled) Gait Pattern/deviations: Step-to pattern;Decreased stride length;Decreased weight shift to right;Trunk flexed;Antalgic;Decreased step length - right     General Gait Details: Assist to advance R LE during ambulation intermittently. Very slow gait speed. Gait is effortful. Distance limited due to pain. Followed with recliner and used it to transport pt back to her room   Stairs             Wheelchair Mobility    Modified Rankin (Stroke Patients Only)       Balance Overall balance assessment: Needs assistance         Standing balance support: Bilateral upper extremity supported Standing balance-Leahy Scale: Poor                              Cognition Arousal/Alertness: Awake/alert Behavior During Therapy: WFL for tasks assessed/performed Overall Cognitive Status: Within Functional Limits for tasks assessed                                        Exercises      General Comments        Pertinent Vitals/Pain Pain Assessment: Faces Faces Pain Scale: Hurts whole lot Pain Location: R hip Pain Descriptors / Indicators: Discomfort;Grimacing;Sharp;Aching;Moaning Pain Intervention(s): Limited activity within patient's tolerance;Monitored during session;Repositioned;Ice applied  Home Living                      Prior Function            PT Goals (current goals can now be found in the care plan section) Progress towards PT goals: Progressing toward goals    Frequency           PT Plan Current plan remains appropriate    Co-evaluation              AM-PAC PT "6 Clicks" Mobility   Outcome Measure  Help needed turning from your back to your side while in a flat bed without using bedrails?: A Lot Help needed moving  from lying on your back to sitting on the side of a flat bed without using bedrails?: A Little Help needed moving to and from a bed to a chair (including a wheelchair)?: A Little Help needed standing up from a chair using your arms (e.g., wheelchair or bedside chair)?: A Little Help needed to walk in hospital room?: A Little Help needed climbing 3-5 steps with a railing? : A Lot 6 Click Score: 16    End of Session Equipment Utilized During Treatment: Gait belt Activity Tolerance: Patient limited by pain Patient left: in chair;with call bell/phone within reach Nurse Communication:  (severe pain) PT Visit Diagnosis: Muscle weakness (generalized) (M62.81);Difficulty in walking, not elsewhere classified (R26.2);Pain Pain - Right/Left: Right Pain - part of body: Hip     Time: 1031-1056 PT Time Calculation (min) (ACUTE ONLY): 25 min  Charges:  $Gait Training: 23-37 mins                         Doreatha Massed, PT Acute Rehabilitation  Office: [phone] Pager: [phone]

## 2020-07-13 DIAGNOSIS — Z87891 Personal history of nicotine dependence: Secondary | ICD-10-CM | POA: Diagnosis not present

## 2020-07-13 DIAGNOSIS — Z8673 Personal history of transient ischemic attack (TIA), and cerebral infarction without residual deficits: Secondary | ICD-10-CM | POA: Diagnosis not present

## 2020-07-13 DIAGNOSIS — M5431 Sciatica, right side: Secondary | ICD-10-CM | POA: Diagnosis not present

## 2020-07-13 DIAGNOSIS — H3530 Unspecified macular degeneration: Secondary | ICD-10-CM | POA: Diagnosis not present

## 2020-07-13 DIAGNOSIS — J309 Allergic rhinitis, unspecified: Secondary | ICD-10-CM | POA: Diagnosis not present

## 2020-07-13 DIAGNOSIS — I493 Ventricular premature depolarization: Secondary | ICD-10-CM | POA: Diagnosis not present

## 2020-07-13 DIAGNOSIS — M797 Fibromyalgia: Secondary | ICD-10-CM | POA: Diagnosis not present

## 2020-07-13 DIAGNOSIS — M7501 Adhesive capsulitis of right shoulder: Secondary | ICD-10-CM | POA: Diagnosis not present

## 2020-07-13 DIAGNOSIS — K311 Adult hypertrophic pyloric stenosis: Secondary | ICD-10-CM | POA: Diagnosis not present

## 2020-07-13 DIAGNOSIS — I10 Essential (primary) hypertension: Secondary | ICD-10-CM | POA: Diagnosis not present

## 2020-07-13 DIAGNOSIS — Z96641 Presence of right artificial hip joint: Secondary | ICD-10-CM | POA: Diagnosis not present

## 2020-07-13 DIAGNOSIS — Z471 Aftercare following joint replacement surgery: Secondary | ICD-10-CM | POA: Diagnosis not present

## 2020-07-13 DIAGNOSIS — Z86010 Personal history of colonic polyps: Secondary | ICD-10-CM | POA: Diagnosis not present

## 2020-07-13 DIAGNOSIS — M75102 Unspecified rotator cuff tear or rupture of left shoulder, not specified as traumatic: Secondary | ICD-10-CM | POA: Diagnosis not present

## 2020-07-13 DIAGNOSIS — F411 Generalized anxiety disorder: Secondary | ICD-10-CM | POA: Diagnosis not present

## 2020-07-13 DIAGNOSIS — F32A Depression, unspecified: Secondary | ICD-10-CM | POA: Diagnosis not present

## 2020-07-13 DIAGNOSIS — K219 Gastro-esophageal reflux disease without esophagitis: Secondary | ICD-10-CM | POA: Diagnosis not present

## 2020-07-13 DIAGNOSIS — Z87442 Personal history of urinary calculi: Secondary | ICD-10-CM | POA: Diagnosis not present

## 2020-07-13 DIAGNOSIS — Z7901 Long term (current) use of anticoagulants: Secondary | ICD-10-CM | POA: Diagnosis not present

## 2020-07-13 DIAGNOSIS — Z7951 Long term (current) use of inhaled steroids: Secondary | ICD-10-CM | POA: Diagnosis not present

## 2020-07-13 DIAGNOSIS — M1711 Unilateral primary osteoarthritis, right knee: Secondary | ICD-10-CM | POA: Diagnosis not present

## 2020-07-13 DIAGNOSIS — L500 Allergic urticaria: Secondary | ICD-10-CM | POA: Diagnosis not present

## 2020-07-13 DIAGNOSIS — J4540 Moderate persistent asthma, uncomplicated: Secondary | ICD-10-CM | POA: Diagnosis not present

## 2020-07-20 DIAGNOSIS — K219 Gastro-esophageal reflux disease without esophagitis: Secondary | ICD-10-CM | POA: Diagnosis not present

## 2020-07-20 DIAGNOSIS — F411 Generalized anxiety disorder: Secondary | ICD-10-CM | POA: Diagnosis not present

## 2020-07-20 DIAGNOSIS — L500 Allergic urticaria: Secondary | ICD-10-CM | POA: Diagnosis not present

## 2020-07-20 DIAGNOSIS — M5431 Sciatica, right side: Secondary | ICD-10-CM | POA: Diagnosis not present

## 2020-07-20 DIAGNOSIS — F32A Depression, unspecified: Secondary | ICD-10-CM | POA: Diagnosis not present

## 2020-07-20 DIAGNOSIS — K311 Adult hypertrophic pyloric stenosis: Secondary | ICD-10-CM | POA: Diagnosis not present

## 2020-07-20 DIAGNOSIS — I10 Essential (primary) hypertension: Secondary | ICD-10-CM | POA: Diagnosis not present

## 2020-07-20 DIAGNOSIS — M797 Fibromyalgia: Secondary | ICD-10-CM | POA: Diagnosis not present

## 2020-07-20 DIAGNOSIS — M7501 Adhesive capsulitis of right shoulder: Secondary | ICD-10-CM | POA: Diagnosis not present

## 2020-07-20 DIAGNOSIS — J309 Allergic rhinitis, unspecified: Secondary | ICD-10-CM | POA: Diagnosis not present

## 2020-07-20 DIAGNOSIS — M75102 Unspecified rotator cuff tear or rupture of left shoulder, not specified as traumatic: Secondary | ICD-10-CM | POA: Diagnosis not present

## 2020-07-20 DIAGNOSIS — J4540 Moderate persistent asthma, uncomplicated: Secondary | ICD-10-CM | POA: Diagnosis not present

## 2020-07-20 DIAGNOSIS — Z471 Aftercare following joint replacement surgery: Secondary | ICD-10-CM | POA: Diagnosis not present

## 2020-07-20 DIAGNOSIS — Z86010 Personal history of colonic polyps: Secondary | ICD-10-CM | POA: Diagnosis not present

## 2020-07-20 DIAGNOSIS — Z7951 Long term (current) use of inhaled steroids: Secondary | ICD-10-CM | POA: Diagnosis not present

## 2020-07-20 DIAGNOSIS — Z96641 Presence of right artificial hip joint: Secondary | ICD-10-CM | POA: Diagnosis not present

## 2020-07-20 DIAGNOSIS — Z8673 Personal history of transient ischemic attack (TIA), and cerebral infarction without residual deficits: Secondary | ICD-10-CM | POA: Diagnosis not present

## 2020-07-20 DIAGNOSIS — Z87891 Personal history of nicotine dependence: Secondary | ICD-10-CM | POA: Diagnosis not present

## 2020-07-20 DIAGNOSIS — H3530 Unspecified macular degeneration: Secondary | ICD-10-CM | POA: Diagnosis not present

## 2020-07-20 DIAGNOSIS — M1711 Unilateral primary osteoarthritis, right knee: Secondary | ICD-10-CM | POA: Diagnosis not present

## 2020-07-20 DIAGNOSIS — Z7901 Long term (current) use of anticoagulants: Secondary | ICD-10-CM | POA: Diagnosis not present

## 2020-07-20 DIAGNOSIS — Z87442 Personal history of urinary calculi: Secondary | ICD-10-CM | POA: Diagnosis not present

## 2020-07-20 DIAGNOSIS — I493 Ventricular premature depolarization: Secondary | ICD-10-CM | POA: Diagnosis not present

## 2020-07-21 DIAGNOSIS — K219 Gastro-esophageal reflux disease without esophagitis: Secondary | ICD-10-CM | POA: Diagnosis not present

## 2020-07-21 DIAGNOSIS — F411 Generalized anxiety disorder: Secondary | ICD-10-CM | POA: Diagnosis not present

## 2020-07-21 DIAGNOSIS — Z7901 Long term (current) use of anticoagulants: Secondary | ICD-10-CM | POA: Diagnosis not present

## 2020-07-21 DIAGNOSIS — Z87442 Personal history of urinary calculi: Secondary | ICD-10-CM | POA: Diagnosis not present

## 2020-07-21 DIAGNOSIS — J4540 Moderate persistent asthma, uncomplicated: Secondary | ICD-10-CM | POA: Diagnosis not present

## 2020-07-21 DIAGNOSIS — M7501 Adhesive capsulitis of right shoulder: Secondary | ICD-10-CM | POA: Diagnosis not present

## 2020-07-21 DIAGNOSIS — M1711 Unilateral primary osteoarthritis, right knee: Secondary | ICD-10-CM | POA: Diagnosis not present

## 2020-07-21 DIAGNOSIS — Z86010 Personal history of colonic polyps: Secondary | ICD-10-CM | POA: Diagnosis not present

## 2020-07-21 DIAGNOSIS — F32A Depression, unspecified: Secondary | ICD-10-CM | POA: Diagnosis not present

## 2020-07-21 DIAGNOSIS — Z8673 Personal history of transient ischemic attack (TIA), and cerebral infarction without residual deficits: Secondary | ICD-10-CM | POA: Diagnosis not present

## 2020-07-21 DIAGNOSIS — Z96641 Presence of right artificial hip joint: Secondary | ICD-10-CM | POA: Diagnosis not present

## 2020-07-21 DIAGNOSIS — H3530 Unspecified macular degeneration: Secondary | ICD-10-CM | POA: Diagnosis not present

## 2020-07-21 DIAGNOSIS — M5431 Sciatica, right side: Secondary | ICD-10-CM | POA: Diagnosis not present

## 2020-07-21 DIAGNOSIS — I10 Essential (primary) hypertension: Secondary | ICD-10-CM | POA: Diagnosis not present

## 2020-07-21 DIAGNOSIS — M75102 Unspecified rotator cuff tear or rupture of left shoulder, not specified as traumatic: Secondary | ICD-10-CM | POA: Diagnosis not present

## 2020-07-21 DIAGNOSIS — Z471 Aftercare following joint replacement surgery: Secondary | ICD-10-CM | POA: Diagnosis not present

## 2020-07-21 DIAGNOSIS — Z87891 Personal history of nicotine dependence: Secondary | ICD-10-CM | POA: Diagnosis not present

## 2020-07-21 DIAGNOSIS — I493 Ventricular premature depolarization: Secondary | ICD-10-CM | POA: Diagnosis not present

## 2020-07-21 DIAGNOSIS — L500 Allergic urticaria: Secondary | ICD-10-CM | POA: Diagnosis not present

## 2020-07-21 DIAGNOSIS — J309 Allergic rhinitis, unspecified: Secondary | ICD-10-CM | POA: Diagnosis not present

## 2020-07-21 DIAGNOSIS — Z7951 Long term (current) use of inhaled steroids: Secondary | ICD-10-CM | POA: Diagnosis not present

## 2020-07-21 DIAGNOSIS — M797 Fibromyalgia: Secondary | ICD-10-CM | POA: Diagnosis not present

## 2020-07-21 DIAGNOSIS — K311 Adult hypertrophic pyloric stenosis: Secondary | ICD-10-CM | POA: Diagnosis not present

## 2020-07-22 DIAGNOSIS — M25552 Pain in left hip: Secondary | ICD-10-CM | POA: Diagnosis not present

## 2020-07-22 DIAGNOSIS — M25512 Pain in left shoulder: Secondary | ICD-10-CM | POA: Diagnosis not present

## 2020-07-25 DIAGNOSIS — Z4789 Encounter for other orthopedic aftercare: Secondary | ICD-10-CM | POA: Diagnosis not present

## 2020-07-25 DIAGNOSIS — M79672 Pain in left foot: Secondary | ICD-10-CM | POA: Diagnosis not present

## 2020-07-26 ENCOUNTER — Encounter: Payer: Self-pay | Admitting: Adult Health

## 2020-07-26 ENCOUNTER — Ambulatory Visit (INDEPENDENT_AMBULATORY_CARE_PROVIDER_SITE_OTHER): Payer: PPO | Admitting: Adult Health

## 2020-07-26 ENCOUNTER — Other Ambulatory Visit: Payer: Self-pay

## 2020-07-26 VITALS — BP 124/74 | HR 82 | Ht 67.0 in | Wt 180.8 lb

## 2020-07-26 DIAGNOSIS — K625 Hemorrhage of anus and rectum: Secondary | ICD-10-CM | POA: Insufficient documentation

## 2020-07-26 DIAGNOSIS — R631 Polydipsia: Secondary | ICD-10-CM | POA: Diagnosis not present

## 2020-07-26 DIAGNOSIS — Z131 Encounter for screening for diabetes mellitus: Secondary | ICD-10-CM | POA: Diagnosis not present

## 2020-07-26 DIAGNOSIS — R1031 Right lower quadrant pain: Secondary | ICD-10-CM | POA: Diagnosis not present

## 2020-07-26 DIAGNOSIS — R5383 Other fatigue: Secondary | ICD-10-CM | POA: Diagnosis not present

## 2020-07-26 DIAGNOSIS — N950 Postmenopausal bleeding: Secondary | ICD-10-CM | POA: Diagnosis not present

## 2020-07-26 LAB — HEMOCCULT GUIAC POC 1CARD (OFFICE): Fecal Occult Blood, POC: NEGATIVE

## 2020-07-26 NOTE — Progress Notes (Signed)
  Subjective:     Patient ID: [name], female   DOB: [birth year], 64 y.o.   MRN: [id number]  HPI [name] is a 64 year old white female, married, PM in complaining of vaginal bleeding started Sunday and has bright red blood with BM. She also complains of being thirsty and tired and has pain RLQ, like cramps. She had right hip replacement 07/08/20 and uses a walker, she also says she has had 8 surgeries since October 2019, and is on eliquis. PCP is Dr Willey Blade   Review of Systems  +vaginal bleeding, RLQ pain, like cramps  Rectal bleeding with BM +thirsty +tired Reviewed past medical,surgical, social and family history. Reviewed medications and allergies.     Objective:   Physical Exam BP 124/74 (BP Location: Right Arm, Patient Position: Sitting, Cuff Size: Normal)   Pulse 82   Ht 5\' 7"  (1.702 m)   Wt 180 lb 12.8 oz (82 kg)   LMP 12/13/2015   BMI 28.32 kg/m  Skin warm and dry.Pelvic: external genitalia is normal in appearance no lesions, vagina: brown discharge without odor,urethra has no lesions or masses noted, cervix:smooth and bulbous, uterus: normal size, shape and contour, non tender, no masses felt, adnexa: no masses,RLQ  tenderness noted. Bladder is non tender and no masses felt.On rectal exam no masses felt and hemoccult was negative. Examination chaperoned by Tish RN Fall risk is low PHQ 2 score is 0  Upstream - 07/26/20 0851      Pregnancy Intention Screening   Does the patient want to become pregnant in the next year? N/A    Does the patient's partner want to become pregnant in the next year? N/A    Would the patient like to discuss contraceptive options today? N/A      Contraception Wrap Up   Current Method Female Sterilization    End Method Female Sterilization    Contraception Counseling Provided No             Assessment:     1. PMB (postmenopausal bleeding) Scheduled pelvic US 08/02/20 at 10:30 am at [facility] She is aware if any endometrial thickness  will get endometrial biopsy to rule endometrial cancer that would require hysterectomy   2. Rectal bleeding Will watch for now since hemoccult negative, but if persists refer to GI  3. Always thirsty Check CMP  4. Tired Check CBC and TSH  5. Screening for diabetes mellitus Check A1c  6. RLQ abdominal pain Will get [place]    Plan:     Will talk when results back

## 2020-07-27 LAB — COMPREHENSIVE METABOLIC PANEL
ALT: 9 IU/L (ref 0–32)
AST: 14 IU/L (ref 0–40)
Albumin/Globulin Ratio: 1.8 (ref 1.2–2.2)
Albumin: 4.4 g/dL (ref 3.8–4.8)
Alkaline Phosphatase: 114 IU/L (ref 44–121)
BUN/Creatinine Ratio: 9 — ABNORMAL LOW (ref 12–28)
BUN: 10 mg/dL (ref 8–27)
Bilirubin Total: 0.2 mg/dL (ref 0.0–1.2)
CO2: 21 mmol/L (ref 20–29)
Calcium: 9.8 mg/dL (ref 8.7–10.3)
Chloride: 105 mmol/L (ref 96–106)
Creatinine, Ser: 1.07 mg/dL — ABNORMAL HIGH (ref 0.57–1.00)
GFR calc Af Amer: 64 mL/min/{1.73_m2} (ref >59)
GFR calc non Af Amer: 55 mL/min/{1.73_m2} — ABNORMAL LOW (ref >59)
Globulin, Total: 2.5 g/dL (ref 1.5–4.5)
Glucose: 104 mg/dL — ABNORMAL HIGH (ref 65–99)
Potassium: 4.9 mmol/L (ref 3.5–5.2)
Sodium: 141 mmol/L (ref 134–144)
Total Protein: 6.9 g/dL (ref 6.0–8.5)

## 2020-07-27 LAB — CBC
Hematocrit: 34.7 % (ref 34.0–46.6)
Hemoglobin: 11 g/dL — ABNORMAL LOW (ref 11.1–15.9)
MCH: 28.6 pg (ref 26.6–33.0)
MCHC: 31.7 g/dL (ref 31.5–35.7)
MCV: 90 fL (ref 79–97)
Platelets: 792 10*3/uL — ABNORMAL HIGH (ref 150–450)
RBC: 3.84 x10E6/uL (ref 3.77–5.28)
RDW: 13.4 % (ref 11.7–15.4)
WBC: 8.9 10*3/uL (ref 3.4–10.8)

## 2020-07-27 LAB — HEMOGLOBIN A1C
Est. average glucose Bld gHb Est-mCnc: 114 mg/dL
Hgb A1c MFr Bld: 5.6 % (ref 4.8–5.6)

## 2020-07-27 LAB — TSH: TSH: 1.73 u[IU]/mL (ref 0.450–4.500)

## 2020-08-02 ENCOUNTER — Ambulatory Visit (HOSPITAL_COMMUNITY)
Admission: RE | Admit: 2020-08-02 | Discharge: 2020-08-02 | Disposition: A | Discharge status: Home / Self Care | Payer: PPO | Source: Ambulatory Visit | Attending: Adult Health | Admitting: Adult Health

## 2020-08-02 ENCOUNTER — Other Ambulatory Visit: Payer: Self-pay

## 2020-08-02 ENCOUNTER — Telehealth: Payer: Self-pay

## 2020-08-02 ENCOUNTER — Telehealth: Payer: Self-pay | Admitting: Adult Health

## 2020-08-02 DIAGNOSIS — N854 Malposition of uterus: Secondary | ICD-10-CM | POA: Diagnosis not present

## 2020-08-02 DIAGNOSIS — N950 Postmenopausal bleeding: Secondary | ICD-10-CM

## 2020-08-02 DIAGNOSIS — N939 Abnormal uterine and vaginal bleeding, unspecified: Secondary | ICD-10-CM | POA: Diagnosis not present

## 2020-08-02 DIAGNOSIS — Z780 Asymptomatic menopausal state: Secondary | ICD-10-CM | POA: Diagnosis not present

## 2020-08-02 NOTE — Telephone Encounter (Signed)
Left message that US showed thickened endometrium and will need endo biopsy, call office for appt with Dr Elonda Husky

## 2020-08-02 NOTE — Telephone Encounter (Signed)
Pt stated that she had a question about "being a 7" she stated if she could get a reply to what this meant via MyChart.

## 2020-08-15 ENCOUNTER — Ambulatory Visit (INDEPENDENT_AMBULATORY_CARE_PROVIDER_SITE_OTHER): Payer: PPO | Admitting: Obstetrics & Gynecology

## 2020-08-15 ENCOUNTER — Encounter: Payer: Self-pay | Admitting: Obstetrics & Gynecology

## 2020-08-15 ENCOUNTER — Other Ambulatory Visit: Payer: Self-pay

## 2020-08-15 ENCOUNTER — Other Ambulatory Visit: Payer: Self-pay | Admitting: Obstetrics & Gynecology

## 2020-08-15 VITALS — BP 153/95 | HR 88 | Ht 67.0 in | Wt 178.0 lb

## 2020-08-15 DIAGNOSIS — N950 Postmenopausal bleeding: Secondary | ICD-10-CM

## 2020-08-15 DIAGNOSIS — R9389 Abnormal findings on diagnostic imaging of other specified body structures: Secondary | ICD-10-CM

## 2020-08-15 NOTE — Progress Notes (Signed)
Endometrial Biopsy Procedure Note  Pre-operative Diagnosis: Post menopausal bleeding with thickened endometrium on sonogram, 7 mm, began on Eliquis therapy  Post-operative Diagnosis: same  Indications: postmenopausal bleeding  Procedure Details   Urine pregnancy test was not done.  The risks (including infection, bleeding, pain, and uterine perforation) and benefits of the procedure were explained to the patient and Written informed consent was obtained.  Antibiotic prophylaxis against endocarditis was not indicated.   The patient was placed in the dorsal lithotomy position.  Bimanual exam showed the uterus to be in the neutral position.  A Graves' speculum inserted in the vagina, and the cervix prepped with povidone iodine.  Endocervical curettage with a Kevorkian curette was not performed.   A sharp tenaculum was applied to the anterior lip of the cervix for stabilization.  A sterile uterine sound was used to sound the uterus to a depth of 6.5 cm.  A Pipelle endometrial aspirator was used to sample the endometrium.  Sample was sent for pathologic examination.  Condition: Stable  Complications: None  Plan:  The patient was advised to call for any fever or for prolonged or severe pain or bleeding. She was advised to use OTC analgesics as needed for mild to moderate pain. She was advised to avoid vaginal intercourse for 48 hours or until the bleeding has completely stopped.  Attending Physician Documentation: I was present for or performed the following: endometrial biopsy

## 2020-08-15 NOTE — Addendum Note (Signed)
Addended by: Colen Darling on: 08/15/2020 03:50 PM   Modules accepted: Orders

## 2020-08-16 DIAGNOSIS — M25551 Pain in right hip: Secondary | ICD-10-CM | POA: Diagnosis not present

## 2020-08-16 DIAGNOSIS — M545 Low back pain, unspecified: Secondary | ICD-10-CM | POA: Diagnosis not present

## 2020-08-22 ENCOUNTER — Telehealth (INDEPENDENT_AMBULATORY_CARE_PROVIDER_SITE_OTHER): Payer: PPO | Admitting: Obstetrics & Gynecology

## 2020-08-22 ENCOUNTER — Encounter: Payer: Self-pay | Admitting: Obstetrics & Gynecology

## 2020-08-22 ENCOUNTER — Other Ambulatory Visit: Payer: Self-pay

## 2020-08-22 DIAGNOSIS — N950 Postmenopausal bleeding: Secondary | ICD-10-CM

## 2020-08-22 NOTE — Progress Notes (Signed)
Follow up appointment for results Telephone call Patient is at home and I am in office: Chief Complaint  Patient presents with  . Follow-up    Last menstrual period 12/13/2015.  Benign endometrium no atypia hyperplasia or malignancy is noted    MEDS ordered this encounter: No orders of the defined types were placed in this encounter.   Orders for this encounter: No orders of the defined types were placed in this encounter.   Impression:   ICD-10-CM   1. PMB (postmenopausal bleeding), on Eliquis  N95.0    Benign pathology most likely a complication of Eliquis therapy     Plan: No further follow up is needed  Follow Up: Return if symptoms worsen or fail to improve.       Non Face to face time:  10 minutes  Greater than 50% of the visit time was spent in counseling and coordination of care with the patient.  The summary and outline of the counseling and care coordination is summarized in the note above.   All questions were answered.  Past Medical History:  Diagnosis Date  . Abnormal Pap smear of cervix   . Anxiety   . Arthritis   . Asthma   . Depression   . Essential hypertension   . Fibromyalgia   . GERD (gastroesophageal reflux disease)   . History of stroke   . PVC's (premature ventricular contractions)   . Sciatica of right side   . Stroke ([place])    STROKE IN RIGHT EYE 20 YEAS AGO O PROBLEMS SINCE     Past Surgical History:  Procedure Laterality Date  . ADENOIDECTOMY    . APPENDECTOMY    . BUNIONECTOMY Bilateral   . CHONDROPLASTY Right 06/19/2018   Procedure: KNEE ARTHROSCOPY WITH chondroplasty;  Surgeon: Carole Civil, MD;  Location: [facility];  Service: Orthopedics;  Laterality: Right;  . COLONOSCOPY  10/08/2011   diverticulosis, hyperplastic polyp removed. next tcs 2023.  Marland Kitchen ESOPHAGOGASTRODUODENOSCOPY N/A 03/12/2019   (EGD) with pyloric dilation;  Surgeon: Danie Binder, MD; nonobstructing Schatzki ring s/p dilation, moderate sized hiatal  hernia, mild gastritis s/p biopsy, single gastric polyp s/p biopsy, possible gastroparesis with retained gastric contents.  Pathology benign with no H. pylori.  . ESOPHAGOGASTRODUODENOSCOPY (EGD) WITH PROPOFOL N/A 08/24/2019   normal stomach, low-grade Schatzki ring s/p dilation, LA Grade A reflux esophagitis, s/p biopsy. Normal esophageal biopsies  . FOOT SURGERY  08/2018  . FOOT SURGERY  05/14/2019  . KNEE SURGERY  05/2018  . POLYPECTOMY  03/12/2019   Procedure: POLYPECTOMY;  Surgeon: Danie Binder, MD;  Location: [facility];  Service: Endoscopy;;  . TONSILLECTOMY    . TOTAL HIP ARTHROPLASTY Right 07/08/2020   Procedure: TOTAL HIP ARTHROPLASTY;  Surgeon: Earlie Server, MD;  Location: [facility];  Service: Orthopedics;  Laterality: Right;  . TUBAL LIGATION      OB History    Gravida  5   Para  5   Term  5   Preterm      AB      Living  5     SAB      IAB      Ectopic      Multiple      Live Births              No Known Allergies  Social History   Socioeconomic History  . Marital status: Married    Spouse name: Not on file  . Number of children:  Not on file  . Years of education: Not on file  . Highest education level: Not on file  Occupational History  . Not on file  Tobacco Use  . Smoking status: Former Smoker    Packs/day: 0.25    Years: 1.00    Pack years: 0.25    Types: Cigarettes    Quit date: 06/17/1975    Years since quitting: 45.2  . Smokeless tobacco: Never Used  Vaping Use  . Vaping Use: Never used  Substance and Sexual Activity  . Alcohol use: No  . Drug use: No  . Sexual activity: Not Currently    Birth control/protection: Surgical, Post-menopausal    Comment: tubal  Other Topics Concern  . Not on file  Social History Narrative  . Not on file   Social Determinants of Health   Financial Resource Strain: Not on file  Food Insecurity: Not on file  Transportation Needs: Not on file  Physical Activity: Not on file  Stress: Not  on file  Social Connections: Not on file    Family History  Problem Relation Age of Onset  . Hypertension Mother   . Diverticulosis Mother   . Hypertension Father   . AAA (abdominal aortic aneurysm) Father   . Cancer Brother        not sure where primary, was metastatic. was jaundice.   Marland Kitchen COPD Brother   . Angioedema Neg Hx   . Atopy Neg Hx   . Eczema Neg Hx   . Immunodeficiency Neg Hx   . Urticaria Neg Hx   . Asthma Neg Hx   . Allergic rhinitis Neg Hx   . Colon cancer Neg Hx

## 2020-09-13 ENCOUNTER — Encounter: Payer: Self-pay | Admitting: Internal Medicine

## 2020-09-16 DIAGNOSIS — H524 Presbyopia: Secondary | ICD-10-CM | POA: Diagnosis not present

## 2020-09-16 DIAGNOSIS — H2513 Age-related nuclear cataract, bilateral: Secondary | ICD-10-CM | POA: Diagnosis not present

## 2020-09-28 ENCOUNTER — Encounter: Payer: Self-pay | Admitting: Internal Medicine

## 2020-09-28 ENCOUNTER — Ambulatory Visit (INDEPENDENT_AMBULATORY_CARE_PROVIDER_SITE_OTHER): Payer: PPO | Admitting: Internal Medicine

## 2020-09-28 ENCOUNTER — Other Ambulatory Visit: Payer: Self-pay

## 2020-09-28 ENCOUNTER — Other Ambulatory Visit: Payer: Self-pay | Admitting: *Deleted

## 2020-09-28 VITALS — BP 140/91 | HR 79 | Temp 96.6°F | Ht 67.0 in | Wt 180.2 lb

## 2020-09-28 DIAGNOSIS — K219 Gastro-esophageal reflux disease without esophagitis: Secondary | ICD-10-CM

## 2020-09-28 DIAGNOSIS — R112 Nausea with vomiting, unspecified: Secondary | ICD-10-CM

## 2020-09-28 DIAGNOSIS — R198 Other specified symptoms and signs involving the digestive system and abdomen: Secondary | ICD-10-CM | POA: Diagnosis not present

## 2020-09-28 DIAGNOSIS — R0989 Other specified symptoms and signs involving the circulatory and respiratory systems: Secondary | ICD-10-CM

## 2020-09-28 NOTE — Progress Notes (Signed)
Referring Provider: Asencion Noble, MD Primary Care Physician:  Asencion Noble, MD Primary GI:  Dr. Abbey Chatters  Chief Complaint  Patient presents with  . Gastroesophageal Reflux    HPI:   [name] is a 65 y.o. female who presents to clinic today for follow-up visit.  She has a history of chronic GERD, globus sensation, EGD in January 2021 with nonobstructing Schatzki's ring status post dilation, reflux esophagitis, negative esophageal biopsies.  GES normal.  Has been seen by ENT and allergy in the past.  Previously recommended that she have surgical referral due to persistent symptoms due to refractory GERD the patient has been against this as she was previously taking care of her husband who passed away from stage IV cancer Jan 14, 2020.  Patient has been on numerous different PPIs, currently taking pantoprazole 40 mg twice daily.  States this helps some but she does have breakthrough symptoms.  Feels as though she has a fur ball stuck in her throat at times.  Also notes which she describes as food allergies.  Depending on what she eats she will have itching after meals.  Does not appear that she is ever had mid esophageal biopsies but distal esophageal biopsies were negative.  For her breakthrough symptoms she is currently takes baking soda and ginger ale in the evening which does seem to help.  Past Medical History:  Diagnosis Date  . Abnormal Pap smear of cervix   . Anxiety   . Arthritis   . Asthma   . Depression   . Essential hypertension   . Fibromyalgia   . GERD (gastroesophageal reflux disease)   . History of stroke   . PVC's (premature ventricular contractions)   . Sciatica of right side   . Stroke ([place])    STROKE IN RIGHT EYE 20 YEAS AGO O PROBLEMS SINCE     Past Surgical History:  Procedure Laterality Date  . ADENOIDECTOMY    . APPENDECTOMY    . BUNIONECTOMY Bilateral   . CHONDROPLASTY Right 06/19/2018   Procedure: KNEE ARTHROSCOPY WITH chondroplasty;  Surgeon:  Carole Civil, MD;  Location: [facility];  Service: Orthopedics;  Laterality: Right;  . COLONOSCOPY  10/08/2011   diverticulosis, hyperplastic polyp removed. next tcs 2023.  Marland Kitchen ESOPHAGOGASTRODUODENOSCOPY N/A 03/12/2019   (EGD) with pyloric dilation;  Surgeon: Danie Binder, MD; nonobstructing Schatzki ring s/p dilation, moderate sized hiatal hernia, mild gastritis s/p biopsy, single gastric polyp s/p biopsy, possible gastroparesis with retained gastric contents.  Pathology benign with no H. pylori.  . ESOPHAGOGASTRODUODENOSCOPY (EGD) WITH PROPOFOL N/A 08/24/2019   normal stomach, low-grade Schatzki ring s/p dilation, LA Grade A reflux esophagitis, s/p biopsy. Normal esophageal biopsies  . FOOT SURGERY  08/2018  . FOOT SURGERY  05/14/2019  . KNEE SURGERY  05/2018  . POLYPECTOMY  03/12/2019   Procedure: POLYPECTOMY;  Surgeon: Danie Binder, MD;  [facility];  Service: Endoscopy;;  . TONSILLECTOMY    . TOTAL HIP ARTHROPLASTY Right 07/08/2020   Procedure: TOTAL HIP ARTHROPLASTY;  Surgeon: Earlie Server, MD;  Location: [facility];  Service: Orthopedics;  Laterality: Right;  . TUBAL LIGATION      Current Outpatient Medications  Medication Sig Dispense Refill  . ALPRAZolam (XANAX) 1 MG tablet Take 1 mg by mouth at bedtime as needed for anxiety.  0  . amLODipine (NORVASC) 5 MG tablet Take 5 mg by mouth daily.  0  . budesonide-formoterol (SYMBICORT) 160-4.5 MCG/ACT inhaler Inhale 2 puffs into  the lungs 2 (two) times daily.    . Calcium Carbonate-Simethicone (ALKA-SELTZER HEARTBURN + GAS) 750-80 MG CHEW Chew by mouth. Chews 2 a day.    . diphenhydrAMINE (BENADRYL) 25 mg capsule Take 25 mg by mouth as needed for itching or allergies.    Marland Kitchen EPINEPHrine 0.3 mg/0.3 mL IJ SOAJ injection Inject 0.3 mg into the muscle as needed for anaphylaxis.    Marland Kitchen guaiFENesin (MUCINEX) 600 MG 12 hr tablet Take 1,200 mg by mouth 2 (two) times daily.     Marland Kitchen lubiprostone (AMITIZA) 24 MCG capsule Take 24 mcg by mouth  2 (two) times a week.    . methocarbamol (ROBAXIN) 750 MG tablet Take 750 mg by mouth at bedtime.    . pantoprazole (PROTONIX) 40 MG tablet Take 1 tablet (40 mg total) by mouth 2 (two) times daily before a meal. 60 tablet 3  . PARoxetine (PAXIL) 20 MG tablet Take 20 mg by mouth at bedtime.    . Prenatal Vit-Fe Fumarate-FA (MULTIVITAMIN-PRENATAL) 27-0.8 MG TABS tablet Take 1 tablet by mouth daily at 12 noon.    Marland Kitchen PROAIR HFA 108 (90 Base) MCG/ACT inhaler Inhale 2 puffs into the lungs as needed for wheezing or shortness of breath.  1  . Sodium Bicarbonate (NICE PURE BAKING SODA) POWD by Does not apply route. Takes 1/2 teaspoon full twice daily.    Marland Kitchen topiramate (TOPAMAX) 200 MG tablet Take 200 mg by mouth 2 (two) times daily.     Marland Kitchen VITAMIN D PO Take by mouth daily.    . furosemide (LASIX) 40 MG tablet Take 40 mg by mouth daily as needed for fluid. (Patient not taking: No sig reported)    . oxyCODONE (OXY IR/ROXICODONE) 5 MG immediate release tablet Take one tab po q4-6hrs prn pain, may need 1-2 first couple days, max 8 tabs in 24hrs. (Patient not taking: Reported on 09/28/2020) 40 tablet 0  . predniSONE (STERAPRED UNI-PAK 48 TAB) 10 MG (48) TBPK tablet Take by mouth. (Patient not taking: Reported on 09/28/2020)     No current facility-administered medications for this visit.    Allergies as of 09/28/2020  . (No Known Allergies)    Family History  Problem Relation Age of Onset  . Hypertension Mother   . Diverticulosis Mother   . Hypertension Father   . AAA (abdominal aortic aneurysm) Father   . Cancer Brother        not sure where primary, was metastatic. was jaundice.   Marland Kitchen COPD Brother   . Angioedema Neg Hx   . Atopy Neg Hx   . Eczema Neg Hx   . Immunodeficiency Neg Hx   . Urticaria Neg Hx   . Asthma Neg Hx   . Allergic rhinitis Neg Hx   . Colon cancer Neg Hx     Social History   Socioeconomic History  . Marital status: Married    Spouse name: Not on file  . Number of children: Not  on file  . Years of education: Not on file  . Highest education level: Not on file  Occupational History  . Not on file  Tobacco Use  . Smoking status: Former Smoker    Packs/day: 0.25    Years: 1.00    Pack years: 0.25    Types: Cigarettes    Quit date: 06/17/1975    Years since quitting: 45.3  . Smokeless tobacco: Never Used  Vaping Use  . Vaping Use: Never used  Substance and Sexual Activity  .  Alcohol use: No  . Drug use: No  . Sexual activity: Not Currently    Birth control/protection: Surgical, Post-menopausal    Comment: tubal  Other Topics Concern  . Not on file  Social History Narrative  . Not on file   Social Determinants of Health   Financial Resource Strain: Not on file  Food Insecurity: Not on file  Transportation Needs: Not on file  Physical Activity: Not on file  Stress: Not on file  Social Connections: Not on file    Subjective: Review of Systems  Constitutional: Negative for chills and fever.  HENT: Negative for congestion and hearing loss.   Eyes: Negative for blurred vision and double vision.  Respiratory: Negative for cough and shortness of breath.   Cardiovascular: Negative for chest pain and palpitations.  Gastrointestinal: Negative for abdominal pain, blood in stool, constipation, diarrhea, heartburn, melena and vomiting.  Genitourinary: Negative for dysuria and urgency.  Musculoskeletal: Negative for joint pain and myalgias.  Skin: Negative for itching and rash.  Neurological: Negative for dizziness and headaches.  Psychiatric/Behavioral: Negative for depression. The patient is not nervous/anxious.      Objective: BP (!) 140/91   Pulse 79   Temp (!) 96.6 F (35.9 C) (Temporal)   Ht 5\' 7"  (1.702 m)   Wt 180 lb 3.2 oz (81.7 kg)   LMP 12/13/2015   BMI 28.22 kg/m  Physical Exam Constitutional:      Appearance: Normal appearance.  HENT:     Head: Normocephalic and atraumatic.  Eyes:     Extraocular Movements: Extraocular  movements intact.     Conjunctiva/sclera: Conjunctivae normal.  Cardiovascular:     Rate and Rhythm: Normal rate and regular rhythm.  Pulmonary:     Effort: Pulmonary effort is normal.     Breath sounds: Normal breath sounds.  Abdominal:     General: Bowel sounds are normal.     Palpations: Abdomen is soft.  Musculoskeletal:        General: No swelling. Normal range of motion.     Cervical back: Normal range of motion and neck supple.  Skin:    General: Skin is warm and dry.     Coloration: Skin is not jaundiced.  Neurological:     General: No focal deficit present.     Mental Status: She is alert and oriented to person, place, and time.  Psychiatric:        Mood and Affect: Mood normal.        Behavior: Behavior normal.      Assessment: *GERD-breakthrough symptoms despite Protonix twice daily *Globus sensation  Plan: Discussed GERD in depth with patient today.  I believe the best neck step would be to schedule her for pH testing as well as esophageal manometry.  She may benefit from [place] fundoplication though we will need to undergo these studies first before surgical referral.  She states she is finally ready for possible referral if need be.  Patient follow-up in 3 months or sooner if needed  09/28/2020 3:36 PM   Disclaimer: This note was dictated with voice recognition software. Similar sounding words can inadvertently be transcribed and may not be corrected upon review.

## 2020-09-28 NOTE — Patient Instructions (Signed)
We will schedule you for pH and manometry testing to further evaluate your symptoms.  Continue on Protonix twice daily.  I have printed off information regarding a 6 food elimination diet which may hone in on particular foods to avoid in regards to your ongoing symptoms.  Further recommendations to follow.  At [facility] we value your feedback. You may receive a survey about your visit today. Please share your experience as we strive to create trusting relationships with our patients to provide genuine, compassionate, quality care.  We appreciate your understanding and patience as we review any laboratory studies, imaging, and other diagnostic tests that are ordered as we care for you. Our office policy is 5 business days for review of these results, and any emergent or urgent results are addressed in a timely manner for your best interest. If you do not hear from our office in 1 week, please contact us.   We also encourage the use of MyChart, which contains your medical information for your review as well. If you are not enrolled in this feature, an access code is on this after visit summary for your convenience. Thank you for allowing [place] to be involved in your care.  It was great to see you today!  I hope you have a great rest of your winter!!    Elon Alas. Abbey Chatters, D.O. Gastroenterology and Hepatology [facility]

## 2020-10-12 ENCOUNTER — Telehealth: Payer: Self-pay | Admitting: Gastroenterology

## 2020-10-12 DIAGNOSIS — K219 Gastro-esophageal reflux disease without esophagitis: Secondary | ICD-10-CM

## 2020-10-12 NOTE — Telephone Encounter (Signed)
Please schedule direct esophageal manometry and 24hr pH impedance at [facility] endoscopy. She doesn't need office visit. Thanks

## 2020-10-12 NOTE — Telephone Encounter (Signed)
Patient has been scheduled for manometry and impedence on 10/31/20 at [facility] .  She will need to arrive at 8:00 for an 8:30 appointment.  She will need to have her COVID screen on 10/27/20 9:45.  Instructions created and will mail to the patient once I speak with her.   Left message for patient to call back

## 2020-10-12 NOTE — Telephone Encounter (Signed)
Patient notified of the appointment dates and times.  She is advised I will mail her the instructions.  She is asked to call if she has any questions or concerns.

## 2020-10-12 NOTE — Telephone Encounter (Signed)
Hey Dr Silverio Decamp, this pt is being referred to [place] for Gastric reflux, nausea and vomiting from [facility] last seen 09/28/2020, records are in epic for review, please advise on scheduling.

## 2020-10-13 DIAGNOSIS — J029 Acute pharyngitis, unspecified: Secondary | ICD-10-CM | POA: Diagnosis not present

## 2020-10-17 DIAGNOSIS — M7742 Metatarsalgia, left foot: Secondary | ICD-10-CM | POA: Diagnosis not present

## 2020-10-17 DIAGNOSIS — M21612 Bunion of left foot: Secondary | ICD-10-CM | POA: Diagnosis not present

## 2020-10-25 ENCOUNTER — Telehealth: Payer: Self-pay | Admitting: *Deleted

## 2020-10-25 MED ORDER — VENLAFAXINE HCL ER 75 MG PO CP24: 75.0000 mg | ORAL_CAPSULE | Freq: Every day | ORAL | 3 refills | Status: DC

## 2020-10-25 NOTE — Telephone Encounter (Signed)
Patient called to speak with [facility] directly. Has a few questions regarding her medication and possible menopause.

## 2020-10-25 NOTE — Telephone Encounter (Signed)
Feels not like her self, not depressed but just wants to say in bed, will stop Paxil and rx Effexor

## 2020-10-25 NOTE — Addendum Note (Signed)
Addended by: Derrek Monaco A on: 10/25/2020 05:30 PM   Modules accepted: Orders

## 2020-10-27 ENCOUNTER — Other Ambulatory Visit (HOSPITAL_COMMUNITY)
Admission: RE | Admit: 2020-10-27 | Discharge: 2020-10-27 | Disposition: A | Discharge status: Home / Self Care | Payer: PPO | Source: Ambulatory Visit | Attending: Gastroenterology | Admitting: Gastroenterology

## 2020-10-27 DIAGNOSIS — Z01812 Encounter for preprocedural laboratory examination: Secondary | ICD-10-CM | POA: Diagnosis not present

## 2020-10-27 DIAGNOSIS — Z20822 Contact with and (suspected) exposure to covid-19: Secondary | ICD-10-CM | POA: Insufficient documentation

## 2020-10-27 LAB — SARS CORONAVIRUS 2 (TAT 6-24 HRS): SARS Coronavirus 2: NEGATIVE

## 2020-10-31 ENCOUNTER — Encounter (HOSPITAL_COMMUNITY)
Admission: RE | Disposition: A | Discharge status: Home / Self Care | Payer: Self-pay | Source: Home / Self Care | Attending: Gastroenterology

## 2020-10-31 ENCOUNTER — Ambulatory Visit (HOSPITAL_COMMUNITY)
Admission: RE | Admit: 2020-10-31 | Discharge: 2020-10-31 | Disposition: A | Discharge status: Home / Self Care | Payer: PPO | Attending: Gastroenterology | Admitting: Gastroenterology

## 2020-10-31 DIAGNOSIS — K219 Gastro-esophageal reflux disease without esophagitis: Secondary | ICD-10-CM | POA: Insufficient documentation

## 2020-10-31 DIAGNOSIS — R1110 Vomiting, unspecified: Secondary | ICD-10-CM | POA: Diagnosis not present

## 2020-10-31 HISTORY — PX: PH IMPEDANCE STUDY: SHX5565

## 2020-10-31 HISTORY — PX: 24 HOUR PH STUDY: SHX5419

## 2020-10-31 HISTORY — PX: ESOPHAGEAL MANOMETRY: SHX5429

## 2020-10-31 SURGERY — MANOMETRY, ESOPHAGUS
Anesthesia: Topical

## 2020-10-31 MED ORDER — LIDOCAINE VISCOUS HCL 2 % MT SOLN
OROMUCOSAL | Status: AC
  Filled 2020-10-31: qty 15

## 2020-10-31 SURGICAL SUPPLY — 2 items
FACESHIELD LNG OPTICON STERILE (SAFETY) IMPLANT
GLOVE BIO SURGEON STRL SZ8 (GLOVE) ×6 IMPLANT

## 2020-10-31 NOTE — Progress Notes (Signed)
Esophageal manometry performed per protocol without complications.  Patient tolerated well. PH probe placed per protocol without complication at 80YI.  Patient tolerated well.  Patient education given on probe, diary and when to return.  Patient verbalized understanding.  Patient to return at 0920 tomorrow to have probe removed.

## 2020-11-01 ENCOUNTER — Encounter (HOSPITAL_COMMUNITY): Payer: Self-pay | Admitting: Gastroenterology

## 2020-11-01 DIAGNOSIS — R1110 Vomiting, unspecified: Secondary | ICD-10-CM

## 2020-11-08 ENCOUNTER — Encounter: Payer: Self-pay | Admitting: Internal Medicine

## 2020-11-12 ENCOUNTER — Other Ambulatory Visit: Payer: Self-pay | Admitting: Gastroenterology

## 2020-11-17 DIAGNOSIS — E785 Hyperlipidemia, unspecified: Secondary | ICD-10-CM | POA: Diagnosis not present

## 2020-11-17 DIAGNOSIS — I10 Essential (primary) hypertension: Secondary | ICD-10-CM | POA: Diagnosis not present

## 2020-12-09 DIAGNOSIS — M25552 Pain in left hip: Secondary | ICD-10-CM | POA: Diagnosis not present

## 2020-12-13 ENCOUNTER — Other Ambulatory Visit (HOSPITAL_COMMUNITY): Payer: Self-pay | Admitting: Orthopedic Surgery

## 2020-12-13 ENCOUNTER — Other Ambulatory Visit: Payer: Self-pay | Admitting: Orthopedic Surgery

## 2020-12-13 DIAGNOSIS — M8700 Idiopathic aseptic necrosis of unspecified bone: Secondary | ICD-10-CM

## 2020-12-13 DIAGNOSIS — M5416 Radiculopathy, lumbar region: Secondary | ICD-10-CM

## 2020-12-25 ENCOUNTER — Encounter (HOSPITAL_COMMUNITY): Payer: Self-pay | Admitting: Emergency Medicine

## 2020-12-25 ENCOUNTER — Emergency Department (HOSPITAL_COMMUNITY): Payer: PPO

## 2020-12-25 ENCOUNTER — Other Ambulatory Visit: Payer: Self-pay

## 2020-12-25 ENCOUNTER — Inpatient Hospital Stay (HOSPITAL_COMMUNITY)
Admission: EM | Admit: 2020-12-25 | Discharge: 2020-12-28 | DRG: 203 | Disposition: A | Discharge status: Home / Self Care | Payer: PPO | Attending: Internal Medicine | Admitting: Internal Medicine

## 2020-12-25 DIAGNOSIS — I10 Essential (primary) hypertension: Secondary | ICD-10-CM | POA: Diagnosis present

## 2020-12-25 DIAGNOSIS — Z8673 Personal history of transient ischemic attack (TIA), and cerebral infarction without residual deficits: Secondary | ICD-10-CM

## 2020-12-25 DIAGNOSIS — M5416 Radiculopathy, lumbar region: Secondary | ICD-10-CM | POA: Diagnosis not present

## 2020-12-25 DIAGNOSIS — F419 Anxiety disorder, unspecified: Secondary | ICD-10-CM | POA: Diagnosis not present

## 2020-12-25 DIAGNOSIS — M545 Low back pain, unspecified: Secondary | ICD-10-CM | POA: Diagnosis not present

## 2020-12-25 DIAGNOSIS — R7303 Prediabetes: Secondary | ICD-10-CM | POA: Diagnosis not present

## 2020-12-25 DIAGNOSIS — R0602 Shortness of breath: Secondary | ICD-10-CM | POA: Diagnosis not present

## 2020-12-25 DIAGNOSIS — K219 Gastro-esophageal reflux disease without esophagitis: Secondary | ICD-10-CM | POA: Diagnosis present

## 2020-12-25 DIAGNOSIS — M533 Sacrococcygeal disorders, not elsewhere classified: Secondary | ICD-10-CM | POA: Diagnosis not present

## 2020-12-25 DIAGNOSIS — M8700 Idiopathic aseptic necrosis of unspecified bone: Secondary | ICD-10-CM | POA: Diagnosis not present

## 2020-12-25 DIAGNOSIS — M797 Fibromyalgia: Secondary | ICD-10-CM | POA: Diagnosis not present

## 2020-12-25 DIAGNOSIS — Z7951 Long term (current) use of inhaled steroids: Secondary | ICD-10-CM

## 2020-12-25 DIAGNOSIS — Z20822 Contact with and (suspected) exposure to covid-19: Secondary | ICD-10-CM | POA: Diagnosis present

## 2020-12-25 DIAGNOSIS — Z9049 Acquired absence of other specified parts of digestive tract: Secondary | ICD-10-CM | POA: Diagnosis not present

## 2020-12-25 DIAGNOSIS — J4541 Moderate persistent asthma with (acute) exacerbation: Secondary | ICD-10-CM

## 2020-12-25 DIAGNOSIS — R059 Cough, unspecified: Secondary | ICD-10-CM | POA: Diagnosis not present

## 2020-12-25 DIAGNOSIS — J4531 Mild persistent asthma with (acute) exacerbation: Secondary | ICD-10-CM | POA: Diagnosis not present

## 2020-12-25 DIAGNOSIS — J45901 Unspecified asthma with (acute) exacerbation: Secondary | ICD-10-CM | POA: Diagnosis present

## 2020-12-25 DIAGNOSIS — R7302 Impaired glucose tolerance (oral): Secondary | ICD-10-CM | POA: Diagnosis not present

## 2020-12-25 DIAGNOSIS — M87852 Other osteonecrosis, left femur: Secondary | ICD-10-CM | POA: Diagnosis not present

## 2020-12-25 DIAGNOSIS — J040 Acute laryngitis: Secondary | ICD-10-CM | POA: Diagnosis not present

## 2020-12-25 DIAGNOSIS — R000 Tachycardia, unspecified: Secondary | ICD-10-CM | POA: Diagnosis not present

## 2020-12-25 DIAGNOSIS — R062 Wheezing: Secondary | ICD-10-CM | POA: Diagnosis not present

## 2020-12-25 DIAGNOSIS — Z79899 Other long term (current) drug therapy: Secondary | ICD-10-CM

## 2020-12-25 DIAGNOSIS — J4521 Mild intermittent asthma with (acute) exacerbation: Secondary | ICD-10-CM | POA: Diagnosis not present

## 2020-12-25 DIAGNOSIS — E7439 Other disorders of intestinal carbohydrate absorption: Secondary | ICD-10-CM | POA: Diagnosis not present

## 2020-12-25 DIAGNOSIS — M1612 Unilateral primary osteoarthritis, left hip: Secondary | ICD-10-CM | POA: Diagnosis not present

## 2020-12-25 DIAGNOSIS — J208 Acute bronchitis due to other specified organisms: Secondary | ICD-10-CM | POA: Diagnosis not present

## 2020-12-25 DIAGNOSIS — R739 Hyperglycemia, unspecified: Secondary | ICD-10-CM | POA: Diagnosis present

## 2020-12-25 DIAGNOSIS — Z87891 Personal history of nicotine dependence: Secondary | ICD-10-CM

## 2020-12-25 DIAGNOSIS — F32A Depression, unspecified: Secondary | ICD-10-CM | POA: Diagnosis not present

## 2020-12-25 DIAGNOSIS — S73192A Other sprain of left hip, initial encounter: Secondary | ICD-10-CM | POA: Diagnosis not present

## 2020-12-25 LAB — CBC WITH DIFFERENTIAL/PLATELET
Abs Immature Granulocytes: 0.08 10*3/uL — ABNORMAL HIGH (ref 0.00–0.07)
Basophils Absolute: 0 10*3/uL (ref 0.0–0.1)
Basophils Relative: 0 %
Eosinophils Absolute: 0 10*3/uL (ref 0.0–0.5)
Eosinophils Relative: 0 %
HCT: 37.6 % (ref 36.0–46.0)
Hemoglobin: 11.7 g/dL — ABNORMAL LOW (ref 12.0–15.0)
Immature Granulocytes: 1 %
Lymphocytes Relative: 5 %
Lymphs Abs: 0.6 10*3/uL — ABNORMAL LOW (ref 0.7–4.0)
MCH: 29.3 pg (ref 26.0–34.0)
MCHC: 31.1 g/dL (ref 30.0–36.0)
MCV: 94 fL (ref 80.0–100.0)
Monocytes Absolute: 0.3 10*3/uL (ref 0.1–1.0)
Monocytes Relative: 2 %
Neutro Abs: 12.7 10*3/uL — ABNORMAL HIGH (ref 1.7–7.7)
Neutrophils Relative %: 92 %
Platelets: 318 10*3/uL (ref 150–400)
RBC: 4 MIL/uL (ref 3.87–5.11)
RDW: 14.9 % (ref 11.5–15.5)
WBC: 13.8 10*3/uL — ABNORMAL HIGH (ref 4.0–10.5)
nRBC: 0 % (ref 0.0–0.2)

## 2020-12-25 LAB — RESP PANEL BY RT-PCR (FLU A&B, COVID) ARPGX2
Influenza A by PCR: NEGATIVE
Influenza B by PCR: NEGATIVE
SARS Coronavirus 2 by RT PCR: NEGATIVE

## 2020-12-25 LAB — BASIC METABOLIC PANEL
Anion gap: 7 (ref 5–15)
BUN: 16 mg/dL (ref 8–23)
CO2: 22 mmol/L (ref 22–32)
Calcium: 8.7 mg/dL — ABNORMAL LOW (ref 8.9–10.3)
Chloride: 110 mmol/L (ref 98–111)
Creatinine, Ser: 0.99 mg/dL (ref 0.44–1.00)
GFR, Estimated: >60 mL/min (ref >60)
Glucose, Bld: 236 mg/dL — ABNORMAL HIGH (ref 70–99)
Potassium: 4.1 mmol/L (ref 3.5–5.1)
Sodium: 139 mmol/L (ref 135–145)

## 2020-12-25 MED ORDER — TOPIRAMATE 100 MG PO TABS
200.0000 mg | ORAL_TABLET | Freq: Two times a day (BID) | ORAL | Status: DC
  Administered 2020-12-25 – 2020-12-28 (×6): 200 mg via ORAL
  Filled 2020-12-25 (×6): qty 2

## 2020-12-25 MED ORDER — ACETAMINOPHEN 325 MG PO TABS
650.0000 mg | ORAL_TABLET | Freq: Four times a day (QID) | ORAL | Status: DC | PRN
  Administered 2020-12-25 – 2020-12-28 (×4): 650 mg via ORAL
  Filled 2020-12-25 (×4): qty 2

## 2020-12-25 MED ORDER — PANTOPRAZOLE SODIUM 40 MG PO TBEC
40.0000 mg | DELAYED_RELEASE_TABLET | Freq: Two times a day (BID) | ORAL | Status: DC
  Administered 2020-12-26 – 2020-12-28 (×5): 40 mg via ORAL
  Filled 2020-12-25 (×5): qty 1

## 2020-12-25 MED ORDER — CALCIUM CARBONATE-SIMETHICONE 750-80 MG PO CHEW: 2.0000 | CHEWABLE_TABLET | Freq: Two times a day (BID) | ORAL | Status: DC

## 2020-12-25 MED ORDER — METHOCARBAMOL 500 MG PO TABS
750.0000 mg | ORAL_TABLET | Freq: Every day | ORAL | Status: DC
  Administered 2020-12-25 – 2020-12-27 (×3): 750 mg via ORAL
  Filled 2020-12-25 (×2): qty 2

## 2020-12-25 MED ORDER — ONDANSETRON HCL 4 MG/2ML IJ SOLN: 4.0000 mg | Freq: Four times a day (QID) | INTRAMUSCULAR | Status: DC | PRN

## 2020-12-25 MED ORDER — IPRATROPIUM-ALBUTEROL 0.5-2.5 (3) MG/3ML IN SOLN
3.0000 mL | Freq: Once | RESPIRATORY_TRACT | Status: AC
  Administered 2020-12-25: 3 mL via RESPIRATORY_TRACT
  Filled 2020-12-25: qty 3

## 2020-12-25 MED ORDER — ACETAMINOPHEN 650 MG RE SUPP: 650.0000 mg | Freq: Four times a day (QID) | RECTAL | Status: DC | PRN

## 2020-12-25 MED ORDER — ALBUTEROL (5 MG/ML) CONTINUOUS INHALATION SOLN
10.0000 mg/h | INHALATION_SOLUTION | Freq: Once | RESPIRATORY_TRACT | Status: AC
  Administered 2020-12-25: 10 mg/h via RESPIRATORY_TRACT
  Filled 2020-12-25: qty 20

## 2020-12-25 MED ORDER — GUAIFENESIN ER 600 MG PO TB12
2400.0000 mg | ORAL_TABLET | Freq: Two times a day (BID) | ORAL | Status: DC
  Administered 2020-12-25 – 2020-12-28 (×6): 2400 mg via ORAL
  Filled 2020-12-25 (×6): qty 4

## 2020-12-25 MED ORDER — AMLODIPINE BESYLATE 5 MG PO TABS
5.0000 mg | ORAL_TABLET | Freq: Every day | ORAL | Status: DC
  Administered 2020-12-26 – 2020-12-28 (×3): 5 mg via ORAL
  Filled 2020-12-25 (×3): qty 1

## 2020-12-25 MED ORDER — MAGNESIUM SULFATE 2 GM/50ML IV SOLN
2.0000 g | Freq: Once | INTRAVENOUS | Status: AC
  Administered 2020-12-25: 2 g via INTRAVENOUS
  Filled 2020-12-25: qty 50

## 2020-12-25 MED ORDER — MOMETASONE FURO-FORMOTEROL FUM 200-5 MCG/ACT IN AERO
2.0000 | INHALATION_SPRAY | Freq: Two times a day (BID) | RESPIRATORY_TRACT | Status: DC
  Administered 2020-12-25: 2 via RESPIRATORY_TRACT
  Filled 2020-12-25: qty 8.8

## 2020-12-25 MED ORDER — ALBUTEROL SULFATE (2.5 MG/3ML) 0.083% IN NEBU
3.0000 mL | INHALATION_SOLUTION | RESPIRATORY_TRACT | Status: DC | PRN
  Administered 2020-12-25 – 2020-12-28 (×3): 3 mL via RESPIRATORY_TRACT
  Filled 2020-12-25 (×3): qty 3

## 2020-12-25 MED ORDER — ALBUTEROL SULFATE (2.5 MG/3ML) 0.083% IN NEBU
2.5000 mg | INHALATION_SOLUTION | Freq: Once | RESPIRATORY_TRACT | Status: AC
  Administered 2020-12-25: 2.5 mg via RESPIRATORY_TRACT
  Filled 2020-12-25: qty 3

## 2020-12-25 MED ORDER — HYDRALAZINE HCL 20 MG/ML IJ SOLN: 10.0000 mg | Freq: Four times a day (QID) | INTRAMUSCULAR | Status: DC | PRN

## 2020-12-25 MED ORDER — CALCIUM CARBONATE ANTACID 500 MG PO CHEW
1.0000 | CHEWABLE_TABLET | Freq: Two times a day (BID) | ORAL | Status: DC
  Administered 2020-12-25 – 2020-12-28 (×6): 200 mg via ORAL
  Filled 2020-12-25 (×6): qty 1

## 2020-12-25 MED ORDER — ENOXAPARIN SODIUM 40 MG/0.4ML IJ SOSY
40.0000 mg | PREFILLED_SYRINGE | INTRAMUSCULAR | Status: DC
  Administered 2020-12-26 – 2020-12-28 (×3): 40 mg via SUBCUTANEOUS
  Filled 2020-12-25 (×3): qty 0.4

## 2020-12-25 MED ORDER — SIMETHICONE 80 MG PO CHEW
80.0000 mg | CHEWABLE_TABLET | Freq: Two times a day (BID) | ORAL | Status: DC
  Administered 2020-12-25 – 2020-12-28 (×6): 80 mg via ORAL
  Filled 2020-12-25 (×6): qty 1

## 2020-12-25 MED ORDER — LUBIPROSTONE 24 MCG PO CAPS
24.0000 ug | ORAL_CAPSULE | Freq: Two times a day (BID) | ORAL | Status: DC
  Administered 2020-12-25 – 2020-12-28 (×6): 24 ug via ORAL
  Filled 2020-12-25 (×6): qty 1

## 2020-12-25 MED ORDER — ONDANSETRON HCL 4 MG PO TABS: 4.0000 mg | ORAL_TABLET | Freq: Four times a day (QID) | ORAL | Status: DC | PRN

## 2020-12-25 MED ORDER — IPRATROPIUM-ALBUTEROL 0.5-2.5 (3) MG/3ML IN SOLN
3.0000 mL | Freq: Four times a day (QID) | RESPIRATORY_TRACT | Status: DC
  Administered 2020-12-26 – 2020-12-28 (×10): 3 mL via RESPIRATORY_TRACT
  Filled 2020-12-25 (×10): qty 3

## 2020-12-25 MED ORDER — DOXYCYCLINE HYCLATE 100 MG PO TABS
100.0000 mg | ORAL_TABLET | Freq: Once | ORAL | Status: AC
  Administered 2020-12-25: 100 mg via ORAL
  Filled 2020-12-25: qty 1

## 2020-12-25 MED ORDER — METHYLPREDNISOLONE SODIUM SUCC 125 MG IJ SOLR
60.0000 mg | Freq: Two times a day (BID) | INTRAMUSCULAR | Status: DC
  Administered 2020-12-25 – 2020-12-27 (×4): 60 mg via INTRAVENOUS
  Filled 2020-12-25 (×4): qty 2

## 2020-12-25 MED ORDER — METHYLPREDNISOLONE SODIUM SUCC 125 MG IJ SOLR
125.0000 mg | Freq: Once | INTRAMUSCULAR | Status: AC
  Administered 2020-12-25: 125 mg via INTRAVENOUS
  Filled 2020-12-25: qty 2

## 2020-12-25 MED ORDER — IPRATROPIUM-ALBUTEROL 0.5-2.5 (3) MG/3ML IN SOLN: 3.0000 mL | Freq: Four times a day (QID) | RESPIRATORY_TRACT | Status: DC | PRN

## 2020-12-25 MED ORDER — SODIUM CHLORIDE 0.9 % IV SOLN: INTRAVENOUS | Status: DC

## 2020-12-25 MED ORDER — BENZONATATE 100 MG PO CAPS: 200.0000 mg | ORAL_CAPSULE | Freq: Three times a day (TID) | ORAL | Status: DC | PRN

## 2020-12-25 MED ORDER — PRENATAL 27-0.8 MG PO TABS
1.0000 | ORAL_TABLET | Freq: Every day | ORAL | Status: DC
  Administered 2020-12-26 – 2020-12-28 (×3): 1 via ORAL
  Filled 2020-12-25 (×5): qty 1

## 2020-12-25 MED ORDER — ALPRAZOLAM 1 MG PO TABS
1.0000 mg | ORAL_TABLET | Freq: Every evening | ORAL | Status: DC | PRN
  Administered 2020-12-25 – 2020-12-27 (×3): 1 mg via ORAL
  Filled 2020-12-25 (×3): qty 1

## 2020-12-25 NOTE — ED Provider Notes (Signed)
[facility] EMERGENCY DEPARTMENT Provider Note   CSN: [id number] Arrival date & time: 12/25/20  1319     History Chief Complaint  Patient presents with  . Shortness of Breath   [name] is a 64 y.o. female with history of asthma versus COPD (former smoker, patient states she has no COPD, on Symbicort daily) who presents with concern for 3 days of persistent cough, chest tightness, and wheezing.  Patient was seen by her PCP on Friday and prescribed oral prednisone and albuterol nebulizer treatments at home which she has been taking with short-term relief.  She states he presented to the emergency department today to "avoid things getting worse".  She states she has been hospitalized for reactive airway issues in the past, but states she has never had to be intubated.  She denies any fevers or chills at home but endorses persistent barking cough and sensation of tightness in the center of her chest.  Denies any sore throat, congestion, nausea, vomiting, or diarrhea.  She states that her asthma flares up with seasonal allergies and changes in the weather, both of which have been more pronounced this week.  Personally reviewed this patient's medical record.  Has history of smoking, but denies any current smoking.  HPI     Past Medical History:  Diagnosis Date  . Abnormal Pap smear of cervix   . Anxiety   . Arthritis   . Asthma   . Depression   . Essential hypertension   . Fibromyalgia   . GERD (gastroesophageal reflux disease)   . History of stroke   . PVC's (premature ventricular contractions)   . Sciatica of right side   . Stroke ([place])    STROKE IN RIGHT EYE 20 YEAS AGO O PROBLEMS SINCE     Patient Active Problem List   Diagnosis Date Noted  . Asthma exacerbation 12/25/2020  . Regurgitation of food   . Tired 07/26/2020  . Always thirsty 07/26/2020  . Rectal bleeding 07/26/2020  . PMB (postmenopausal bleeding) 07/26/2020  . Screening for diabetes mellitus  07/26/2020  . RLQ abdominal pain 07/26/2020  . 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 Globus sensation 12/22/2019  . Dyspepsia   . Acquired pyloric stenosis   . S/P right knee arthroscopy 06/19/18 06/26/2018  . Chondromalacia of medial femoral condyle, right   . Primary osteoarthritis of right knee   . Allergic reaction 09/16/2017  . Allergic urticaria 09/16/2017  . Angioedema 09/16/2017  . Allergic rhinitis 09/16/2017  . Generalized anxiety disorder 12/03/2016  . Depression 12/03/2016  . GERD (gastroesophageal reflux disease) 12/03/2016  . Personal history of stroke with residual effects 12/03/2016  . Moderate persistent asthma 12/02/2016  . Fibromyalgia   . Adhesive capsulitis of right shoulder 08/31/2013  . Adhesive capsulitis 07/29/2013  . Rotator cuff syndrome of left shoulder 07/29/2013  . Low back pain 12/01/2012  . BUNION 02/23/2008    Past Surgical History:  Procedure Laterality Date  . [place] STUDY  10/31/2020   Procedure: [place] STUDY;  Surgeon: Mauri Pole, MD;  Location: [facility] ENDOSCOPY;  Service: Endoscopy;;  . ADENOIDECTOMY    . APPENDECTOMY    . BUNIONECTOMY Bilateral   . CHONDROPLASTY Right 06/19/2018   Procedure: KNEE ARTHROSCOPY WITH chondroplasty;  Surgeon: Carole Civil, MD;  Location: [facility] ORS;  Service: Orthopedics;  Laterality: Right;  . COLONOSCOPY  10/08/2011   diverticulosis, hyperplastic polyp removed. next tcs 2023.  Marland Kitchen ESOPHAGEAL MANOMETRY N/A 10/31/2020  Procedure: ESOPHAGEAL MANOMETRY (EM);  Surgeon: Mauri Pole, MD;  Location: [facility] ENDOSCOPY;  Service: Endoscopy;  Laterality: N/A;  . ESOPHAGOGASTRODUODENOSCOPY N/A 03/12/2019   (EGD) with pyloric dilation;  Surgeon: Danie Binder, MD; nonobstructing Schatzki ring s/p dilation, moderate sized hiatal hernia, mild gastritis s/p biopsy, single gastric polyp s/p biopsy, possible gastroparesis with retained gastric contents.  Pathology benign with no H. pylori.  .  ESOPHAGOGASTRODUODENOSCOPY (EGD) WITH PROPOFOL N/A 08/24/2019   normal stomach, low-grade Schatzki ring s/p dilation, LA Grade A reflux esophagitis, s/p biopsy. Normal esophageal biopsies  . FOOT SURGERY  08/2018  . FOOT SURGERY  05/14/2019  . KNEE SURGERY  05/2018  . [place] IMPEDANCE STUDY N/A 10/31/2020   Procedure: [place] IMPEDANCE STUDY;  Surgeon: Mauri Pole, MD;  Location: [facility] ENDOSCOPY;  Service: Endoscopy;  Laterality: N/A;  . POLYPECTOMY  03/12/2019   Procedure: POLYPECTOMY;  Surgeon: Danie Binder, MD;  Location: [facility] END[facility] SUITE;  Service: Endoscopy;;  . TONSILLECTOMY    . TOTAL HIP ARTHROPLASTY Right 07/08/2020   Procedure: TOTAL HIP ARTHROPLASTY;  Surgeon: Earlie Server, MD;  Location: [facility] ORS;  Service: Orthopedics;  Laterality: Right;  . TUBAL LIGATION       OB History    Gravida  5   Para  5   Term  5   Preterm      AB      Living  5     SAB      IAB      Ectopic      Multiple      Live Births              Family History  Problem Relation Age of Onset  . Hypertension Mother   . Diverticulosis Mother   . Hypertension Father   . AAA (abdominal aortic aneurysm) Father   . Cancer Brother        not sure where primary, was metastatic. was jaundice.   Marland Kitchen COPD Brother   . Angioedema Neg Hx   . Atopy Neg Hx   . Eczema Neg Hx   . Immunodeficiency Neg Hx   . Urticaria Neg Hx   . Asthma Neg Hx   . Allergic rhinitis Neg Hx   . Colon cancer Neg Hx     Social History   Tobacco Use  . Smoking status: Former Smoker    Packs/day: 0.25    Years: 1.00    Pack years: 0.25    Types: Cigarettes    Quit date: 06/17/1975    Years since quitting: 45.5  . Smokeless tobacco: Never Used  Vaping Use  . Vaping Use: Never used  Substance Use Topics  . Alcohol use: No  . Drug use: No    Home Medications Prior to Admission medications   Medication Sig Start Date End Date Taking? Authorizing Provider  ALPRAZolam Duanne Moron) 1 MG tablet Take 1 mg by mouth at  bedtime as needed for anxiety. 08/17/17  Yes [provider]  amLODipine (NORVASC) 5 MG tablet Take 5 mg by mouth daily. 07/04/17  Yes [provider]  benzonatate (TESSALON) 200 MG capsule Take 200 mg by mouth 3 (three) times daily as needed. 12/23/20  Yes [provider]  budesonide-formoterol (SYMBICORT) 160-4.5 MCG/ACT inhaler Inhale 2 puffs into the lungs 2 (two) times daily.   Yes [provider]  Calcium Carbonate-Simethicone (ALKA-SELTZER HEARTBURN + GAS) 750-80 MG CHEW Chew 2 tablets by mouth 2 (two) times daily. Chews 2 a  day.   Yes [provider]  diphenhydrAMINE (BENADRYL) 25 mg capsule Take 25 mg by mouth as needed for itching or allergies.   Yes [provider]  EPINEPHrine 0.3 mg/0.3 mL IJ SOAJ injection Inject 0.3 mg into the muscle as needed for anaphylaxis.   Yes [provider]  Guaifenesin 1200 MG TB12 Take 2,400 mg by mouth 2 (two) times daily.   Yes [provider]  ipratropium-albuterol (DUONEB) 0.5-2.5 (3) MG/3ML SOLN Inhale 3 mLs into the lungs every 4 (four) hours as needed (shortness of breath). 12/23/20  Yes [provider]  lubiprostone (AMITIZA) 24 MCG capsule Take 24 mcg by mouth 2 (two) times daily. 07/11/20  Yes [provider]  methocarbamol (ROBAXIN) 750 MG tablet Take 750 mg by mouth at bedtime. 08/16/20  Yes [provider]  pantoprazole (PROTONIX) 40 MG tablet TAKE ONE TABLET (40MG  TOTAL) BY MOUTH TWO TIMES DAILY BEFORE A MEAL Patient taking differently: Take 40 mg by mouth 2 (two) times daily before a meal. 11/14/20  Yes Jodi Mourning, Kristen S, PA-C  predniSONE (DELTASONE) 10 MG tablet Take 10 mg by mouth See admin instructions. Sterapred 12 day taper schedule 12/23/20  Yes [provider]  Prenatal Vit-Fe Fumarate-FA (MULTIVITAMIN-PRENATAL) 27-0.8 MG TABS tablet Take 1 tablet by mouth daily at 12 noon.   Yes [provider]  PROAIR HFA 108 (90 Base) MCG/ACT  inhaler Inhale 2 puffs into the lungs as needed for wheezing or shortness of breath.   Yes [provider]  Sodium Bicarbonate POWD by Does not apply route. Takes 1/2 teaspoon full twice daily.   Yes [provider]  topiramate (TOPAMAX) 200 MG tablet Take 200 mg by mouth 2 (two) times daily.    Yes [provider]  VITAMIN D PO Take 1 tablet by mouth daily.   Yes [provider]    Allergies    Patient has no known allergies.  Review of Systems   Review of Systems  Constitutional: Negative for activity change, appetite change, chills, diaphoresis, fatigue and fever.  HENT: Negative.   Respiratory: Positive for choking, chest tightness, shortness of breath and wheezing.   Cardiovascular: Negative for chest pain, palpitations and leg swelling.  Gastrointestinal: Negative.   Genitourinary: Negative.   Musculoskeletal: Negative.   Skin: Negative.   Neurological: Negative.   Hematological: Negative.     Physical Exam Updated Vital Signs BP 139/90   Pulse 100   Temp 98.3 F (36.8 C) (Oral)   Resp (!) 25   Ht 5\' 7"  (1.702 m)   Wt 77.1 kg   LMP 12/13/2015   SpO2 97%   BMI 26.63 kg/m   Physical Exam Vitals and nursing note reviewed.  Constitutional:      Appearance: She is not toxic-appearing.  HENT:     Head: Normocephalic and atraumatic.     Nose: Nose normal.     Mouth/Throat:     Mouth: Mucous membranes are moist.     Pharynx: Oropharynx is clear. Uvula midline. No oropharyngeal exudate, posterior oropharyngeal erythema or uvula swelling.     Tonsils: No tonsillar exudate.     Comments: No oropharyngeal swelling or sublingual tenderness to palpation, no anterior neck swelling or tenderness to palpation. Eyes:     General: Lids are normal. Vision grossly intact.        Right eye: No discharge.        Left eye: No discharge.     Extraocular Movements: Extraocular movements intact.  Conjunctiva/sclera: Conjunctivae normal.      Pupils: Pupils are equal, round, and reactive to light.  Neck:     Trachea: Trachea and phonation normal.  Cardiovascular:     Rate and Rhythm: Regular rhythm. Tachycardia present.     Pulses: Normal pulses.     Heart sounds: Normal heart sounds. No murmur heard.   Pulmonary:     Effort: Tachypnea, accessory muscle usage and prolonged expiration present. No respiratory distress.     Breath sounds: Examination of the right-middle field reveals wheezing. Examination of the left-middle field reveals wheezing. Examination of the right-lower field reveals decreased breath sounds and wheezing. Examination of the left-lower field reveals decreased breath sounds and wheezing. Decreased breath sounds and wheezing present. No rales.  Chest:     Chest wall: No mass, lacerations, deformity, swelling, tenderness, crepitus or edema.  Abdominal:     General: Bowel sounds are normal. There is no distension.     Palpations: Abdomen is soft.     Tenderness: There is no abdominal tenderness. There is no right CVA tenderness, left CVA tenderness, guarding or rebound.  Musculoskeletal:        General: No deformity.     Cervical back: Normal range of motion and neck supple. No edema, rigidity or crepitus. No pain with movement, spinous process tenderness or muscular tenderness.     Right lower leg: No edema.     Left lower leg: No edema.  Lymphadenopathy:     Cervical: No cervical adenopathy.  Skin:    General: Skin is warm and dry.  Neurological:     General: No focal deficit present.     Mental Status: She is alert and oriented to person, place, and time. Mental status is at baseline.     Sensory: Sensation is intact.     Motor: Motor function is intact.  Psychiatric:        Mood and Affect: Mood normal.     ED Results / Procedures / Treatments   Labs (all labs ordered are listed, but only abnormal results are displayed) Labs Reviewed  BASIC METABOLIC PANEL - Abnormal; Notable for the following  components:      Result Value   Glucose, Bld 236 (*)    Calcium 8.7 (*)    All other components within normal limits  CBC WITH DIFFERENTIAL/PLATELET - Abnormal; Notable for the following components:   WBC 13.8 (*)    Hemoglobin 11.7 (*)    Neutro Abs 12.7 (*)    Lymphs Abs 0.6 (*)    Abs Immature Granulocytes 0.08 (*)    All other components within normal limits  RESP PANEL BY RT-PCR (FLU A&B, COVID) [id number]    EKG EKG Interpretation  Date/Time:  Sunday Dec 25 2020 13:34:34 EDT Ventricular Rate:  101 PR Interval:  150 QRS Duration: 91 QT Interval:  349 QTC Calculation: 453 R Axis:   58 Text Interpretation: Sinus tachycardia Probable left atrial enlargement Confirmed by Fredia Sorrow [phone]) on 12/25/2020 1:37:06 PM   Radiology DG Chest Port 1 View  Result Date: 12/25/2020 CLINICAL DATA:  Cough, wheezing and shortness of breath for the past 3 days. EXAM: PORTABLE CHEST 1 VIEW COMPARISON:  None. FINDINGS: The heart size and mediastinal contours are within normal limits. Both lungs are clear. The visualized skeletal structures are unremarkable. IMPRESSION: No active disease. Electronically Signed   By: Claudie Revering M.D.   On: 12/25/2020 14:32    Procedures Procedures   Medications Ordered in ED  Medications  albuterol (PROVENTIL,VENTOLIN) solution continuous neb (has no administration in time range)  doxycycline (VIBRA-TABS) tablet 100 mg (has no administration in time range)  ipratropium-albuterol (DUONEB) 0.5-2.5 (3) MG/3ML nebulizer solution 3 mL (3 mLs Nebulization Given 12/25/20 1428)  methylPREDNISolone sodium succinate (SOLU-MEDROL) 125 mg/2 mL injection 125 mg (125 mg Intravenous Given 12/25/20 1432)  albuterol (PROVENTIL,VENTOLIN) solution continuous neb (10 mg/hr Nebulization Given 12/25/20 1634)  magnesium sulfate IVPB 2 g 50 mL (0 g Intravenous Stopped 12/25/20 1832)  ipratropium-albuterol (DUONEB) 0.5-2.5 (3) MG/3ML nebulizer solution 3 mL (3 mLs Nebulization Given 12/25/20  1804)  albuterol (PROVENTIL) (2.5 MG/3ML) 0.083% nebulizer solution 2.5 mg (2.5 mg Nebulization Given 12/25/20 1804)    ED Course  I have reviewed the triage vital signs and the nursing notes.  Pertinent labs & imaging results that were available during my care of the patient were reviewed by me and considered in my medical decision making (see chart for details).  Clinical Course as of 12/25/20 1846  Sun Dec 25, 2020  1800 Patient reevaluated following continuous albuterol administration, with further improvement in her wheezing.  She continues to be quite tight in her mid lower lung fields.  Will administer third treatment prior to discharge. Respiratory therapy aware.  [RS]  1836 Patient reevaluated immediately after DuoNeb which is started respiratory treatment, patient expressing worsening tightness in her chest, patient is more tachypneic and tachycardic to the 130s at this time.  Patient was ambulated in the hallway with increase in her heart rate to the 150s, significant dyspnea, tachypnea, though she did maintain her oxygen saturations.  At this time patient is not stable to be discharged home.  Feel she would benefit from admission to the hospital at this time.  Patient is agreeable to plan.  Consult placed to hospitalist. [RS]  1843 Consult placed to hospitalist, Dr. Doristine Bosworth, who is agreeable to seeing this patient and admitting her to his service.  Appreciate his collaboration in the care of this patient. [RS]    Clinical Course User Index [RS] Subrina Vecchiarelli, Gypsy Balsam, PA-C   MDM Rules/Calculators/A&P                         65 year old female with history of asthma who presents with concern for dyspnea, wheezing, persistent cough.  Recently diagnosed with bronchitis at her PCP, on oral prednisone and nebulized treatments at home.  Differential diagnosis includes but is not limited to acute asthma exacerbation, airway obstruction, bronchitis, foreign body, angioedema, anaphylaxis,  pulmonary hypertension, PE, ACS.  Hypertensive and tachycardic on intake.  Cardiac exam revealed tachycardia, pulmonary exam revealed diffuse wheezing throughout.  Additionally patient with obvious laryngitis with high-pitched, squeaky voice.  No oropharyngeal swelling or anterior neck swelling or crepitus to suggest anaphylaxis, angioedema, or oropharyngeal mass.  Abdominal exam is benign.  Patient is neurovascular intact in all 4 extremities.  CBC with leukocytosis Of 13.8, BMP with hyperglycemia 236, the patient has been oral steroids for the last 3 days.  Respiratory pathogen panel negative for COVID and influenza A/B.  Chest x-ray negative for acute cardiopulmonary disease, no signs of infiltrate.  EKG reassuring, no STEMI or other ischemic changes.  Patient administered 125 mg of IV Solu-Medrol and DuoNeb with improvement in her wheezing though she continues to have diffuse wheezing in the lung fields bilaterally.  We will proceed with continuous albuterol treatment and dose of IV magnesium.  Patient reevaluated with continuous improvement in her dyspnea, she is no longer  tachypneic, currently receiving second DuoNeb as respiratory therapy was unavailable to perform continuous albuterol treatment. Case discussed with attending physician.  Presentation most consistent with acute viral upper respiratory infection with concurrent asthma exacerbation.   Despite multiple breathing treatments, IV steroids and magnesium, patient remains dyspneic, tachypneic, tachycardic with increased work of breathing at this time.  Feel this patient would benefit from admission to the hospital.  Consult placed to hospitalist for admission patient has been ordered another continuous albuterol nebulizer treatment.  Will administer first dose of oral doxycycline for possible bacterial component.  Suzy voiced understanding for medical evaluation and treatment plan.  Each of her questions was answered to her expressed  satisfaction.  She is amenable to plan for admission at this time.  This chart was dictated using voice recognition software, Dragon. Despite the best efforts of this provider to proofread and correct errors, errors may still occur which can change documentation meaning.   Final Clinical Impression(s) / ED Diagnoses Final diagnoses:  Moderate persistent asthma with exacerbation    Rx / DC Orders ED Discharge Orders    None       Aura Dials 12/25/20 1846    Noemi Chapel, MD 12/27/20 [phone]

## 2020-12-25 NOTE — H&P (Signed)
History and Physical    [name] GEX:[id number] DOB: Aug 19, 1956 DOA: 12/25/2020  PCP: Asencion Noble, MD  Patient coming from: home  I have personally briefly reviewed patient's old medical records in [place]  Chief Complaint: cough/shortness of breath  HPI: [name] is a 65 y.o. female with medical history significant of asthma, GERD, essential hypertension, arthritis presented to ED with a complaint of shortness of breath and cough.  According to patient, her symptoms started almost 3 days ago.  She has nonproductive cough.  She called her PCP on Friday and was prescribed prednisone and antibiotics.  She started taking prednisone but no antibiotics.  She has been requiring frequent breathing treatments.  Despite of all of this, her symptoms continue to get worse so she decided to come to the emergency department.  She denies any fever, chills, sweating, nausea, vomiting or any other complaint.  She takes care of her 71-month-old grandchild but according to her, she is not sick and there is no other sick contact or any recent travel history.  She does not smoke.  No passive smoking reported either.  She is mildly allergic to pollens.  She also has hoarse voice since about 2 days  ED Course: Upon arrival to ED, she was tachycardic as well as tachypneic.  She received Multiple treatments of bronchodilators as well as IV Solu-Medrol but despite of that, she continued to remain very tight and wheezy so hospital service were consulted to admit the patient for further management.  Chest x-ray unremarkable.  She was not hypoxic, even with exertion.  CBC with mild leukocytosis of 13.8.  COVID-negative. blood sugar 236.  Review of Systems: As per HPI otherwise negative.    Past Medical History:  Diagnosis Date  . Abnormal Pap smear of cervix   . Anxiety   . Arthritis   . Asthma   . Depression   . Essential hypertension   . Fibromyalgia   . GERD (gastroesophageal reflux  disease)   . History of stroke   . PVC's (premature ventricular contractions)   . Sciatica of right side   . Stroke ([place])    STROKE IN RIGHT EYE 20 YEAS AGO O PROBLEMS SINCE     Past Surgical History:  Procedure Laterality Date  . [place] STUDY  10/31/2020   Procedure: [place] STUDY;  Surgeon: Mauri Pole, MD;  Location: [facility];  Service: Endoscopy;;  . ADENOIDECTOMY    . APPENDECTOMY    . BUNIONECTOMY Bilateral   . CHONDROPLASTY Right 06/19/2018   Procedure: KNEE ARTHROSCOPY WITH chondroplasty;  Surgeon: Carole Civil, MD;  Location: [facility];  Service: Orthopedics;  Laterality: Right;  . COLONOSCOPY  10/08/2011   diverticulosis, hyperplastic polyp removed. next tcs 2023.  Marland Kitchen ESOPHAGEAL MANOMETRY N/A 10/31/2020   Procedure: ESOPHAGEAL MANOMETRY (EM);  Surgeon: Mauri Pole, MD;  Location: [facility];  Service: Endoscopy;  Laterality: N/A;  . ESOPHAGOGASTRODUODENOSCOPY N/A 03/12/2019   (EGD) with pyloric dilation;  Surgeon: Danie Binder, MD; nonobstructing Schatzki ring s/p dilation, moderate sized hiatal hernia, mild gastritis s/p biopsy, single gastric polyp s/p biopsy, possible gastroparesis with retained gastric contents.  Pathology benign with no H. pylori.  . ESOPHAGOGASTRODUODENOSCOPY (EGD) WITH PROPOFOL N/A 08/24/2019   normal stomach, low-grade Schatzki ring s/p dilation, LA Grade A reflux esophagitis, s/p biopsy. Normal esophageal biopsies  . FOOT SURGERY  08/2018  . FOOT SURGERY  05/14/2019  . KNEE SURGERY  05/2018  .  [place] IMPEDANCE STUDY N/A 10/31/2020   Procedure: [place] IMPEDANCE STUDY;  Surgeon: Mauri Pole, MD;  Location: [facility];  Service: Endoscopy;  Laterality: N/A;  . POLYPECTOMY  03/12/2019   Procedure: POLYPECTOMY;  Surgeon: Danie Binder, MD;  Location: [facility];  Service: Endoscopy;;  . TONSILLECTOMY    . TOTAL HIP ARTHROPLASTY Right 07/08/2020   Procedure: TOTAL HIP ARTHROPLASTY;  Surgeon: Earlie Server, MD;   Location: [facility];  Service: Orthopedics;  Laterality: Right;  . TUBAL LIGATION       reports that she quit smoking about 45 years ago. Her smoking use included cigarettes. She has a 0.25 pack-year smoking history. She has never used smokeless tobacco. She reports that she does not drink alcohol and does not use drugs.  No Known Allergies  Family History  Problem Relation Age of Onset  . Hypertension Mother   . Diverticulosis Mother   . Hypertension Father   . AAA (abdominal aortic aneurysm) Father   . Cancer Brother        not sure where primary, was metastatic. was jaundice.   Marland Kitchen COPD Brother   . Angioedema Neg Hx   . Atopy Neg Hx   . Eczema Neg Hx   . Immunodeficiency Neg Hx   . Urticaria Neg Hx   . Asthma Neg Hx   . Allergic rhinitis Neg Hx   . Colon cancer Neg Hx     Prior to Admission medications   Medication Sig Start Date End Date Taking? Authorizing Provider  ALPRAZolam Duanne Moron) 1 MG tablet Take 1 mg by mouth at bedtime as needed for anxiety. 08/17/17  Yes [provider]  amLODipine (NORVASC) 5 MG tablet Take 5 mg by mouth daily. 07/04/17  Yes [provider]  benzonatate (TESSALON) 200 MG capsule Take 200 mg by mouth 3 (three) times daily as needed. 12/23/20  Yes [provider]  budesonide-formoterol (SYMBICORT) 160-4.5 MCG/ACT inhaler Inhale 2 puffs into the lungs 2 (two) times daily.   Yes [provider]  Calcium Carbonate-Simethicone (ALKA-SELTZER HEARTBURN + GAS) 750-80 MG CHEW Chew 2 tablets by mouth 2 (two) times daily. Chews 2 a day.   Yes [provider]  diphenhydrAMINE (BENADRYL) 25 mg capsule Take 25 mg by mouth as needed for itching or allergies.   Yes [provider]  EPINEPHrine 0.3 mg/0.3 mL IJ SOAJ injection Inject 0.3 mg into the muscle as needed for anaphylaxis.   Yes [provider]  Guaifenesin 1200 MG TB12 Take 2,400 mg by mouth 2 (two) times daily.   Yes [provider]   ipratropium-albuterol (DUONEB) 0.5-2.5 (3) MG/3ML SOLN Inhale 3 mLs into the lungs every 4 (four) hours as needed (shortness of breath). 12/23/20  Yes [provider]  lubiprostone (AMITIZA) 24 MCG capsule Take 24 mcg by mouth 2 (two) times daily. 07/11/20  Yes [provider]  methocarbamol (ROBAXIN) 750 MG tablet Take 750 mg by mouth at bedtime. 08/16/20  Yes [provider]  pantoprazole (PROTONIX) 40 MG tablet TAKE ONE TABLET (40MG  TOTAL) BY MOUTH TWO TIMES DAILY BEFORE A MEAL Patient taking differently: Take 40 mg by mouth 2 (two) times daily before a meal. 11/14/20  Yes Jodi Mourning, Kristen S, PA-C  predniSONE (DELTASONE) 10 MG tablet Take 10 mg by mouth See admin instructions. Sterapred 12 day taper schedule 12/23/20  Yes [provider]  Prenatal Vit-Fe Fumarate-FA (MULTIVITAMIN-PRENATAL) 27-0.8 MG TABS tablet Take 1 tablet by mouth daily at 12 noon.  Yes [provider]  PROAIR HFA 108 (90 Base) MCG/ACT inhaler Inhale 2 puffs into the lungs as needed for wheezing or shortness of breath.   Yes [provider]  Sodium Bicarbonate POWD by Does not apply route. Takes 1/2 teaspoon full twice daily.   Yes [provider]  topiramate (TOPAMAX) 200 MG tablet Take 200 mg by mouth 2 (two) times daily.    Yes [provider]  VITAMIN D PO Take 1 tablet by mouth daily.   Yes [provider]    Physical Exam: Vitals:   12/25/20 1331 12/25/20 1400 12/25/20 1634 12/25/20 1807  BP: (!) 141/95 139/90    Pulse: (!) 114 100    Resp: 20 (!) 25    Temp: 98.3 F (36.8 C)     TempSrc: Oral     SpO2: 95% 93% 96% 97%  Weight:      Height:        Constitutional: NAD, calm, comfortable Vitals:   12/25/20 1331 12/25/20 1400 12/25/20 1634 12/25/20 1807  BP: (!) 141/95 139/90    Pulse: (!) 114 100    Resp: 20 (!) 25    Temp: 98.3 F (36.8 C)     TempSrc: Oral     SpO2: 95% 93% 96% 97%  Weight:      Height:       Eyes: PERRL,  lids and conjunctivae normal ENMT: Mucous membranes are moist. Posterior pharynx clear of any exudate or lesions.Normal dentition.  Neck: normal, supple, no masses, no thyromegaly Respiratory: Bilateral diffuse expiratory wheezes with nonstop cough, no crackles. Normal respiratory effort. No accessory muscle use.  She has hoarse voice. Cardiovascular: Regular rate and rhythm, no murmurs / rubs / gallops. No extremity edema. 2+ pedal pulses. No carotid bruits.  Abdomen: no tenderness, no masses palpated. No hepatosplenomegaly. Bowel sounds positive.  Musculoskeletal: no clubbing / cyanosis. No joint deformity upper and lower extremities. Good ROM, no contractures. Normal muscle tone.  Skin: no rashes, lesions, ulcers. No induration Neurologic: CN 2-12 grossly intact. Sensation intact, DTR normal. Strength 5/5 in all 4.  Psychiatric: Normal judgment and insight. Alert and oriented x 3. Normal mood.    Labs on Admission: I have personally reviewed following labs and imaging studies  CBC: Recent Labs  Lab 12/25/20 1422  WBC 13.8*  NEUTROABS 12.7*  HGB 11.7*  HCT 37.6  MCV 94.0  PLT 0000000   Basic Metabolic Panel: Recent Labs  Lab 12/25/20 1422  NA 139  K 4.1  CL 110  CO2 22  GLUCOSE 236*  BUN 16  CREATININE 0.99  CALCIUM 8.7*   GFR: Estimated Creatinine Clearance: 61.4 mL/min (by C-G formula based on SCr of 0.99 mg/dL). Liver Function Tests: No results for input(s): AST, ALT, ALKPHOS, BILITOT, PROT, ALBUMIN in the last 168 hours. No results for input(s): LIPASE, AMYLASE in the last 168 hours. No results for input(s): AMMONIA in the last 168 hours. Coagulation Profile: No results for input(s): INR, PROTIME in the last 168 hours. Cardiac Enzymes: No results for input(s): CKTOTAL, CKMB, CKMBINDEX, TROPONINI in the last 168 hours. BNP (last 3 results) No results for input(s): PROBNP in the last 8760 hours. HbA1C: No results for input(s): HGBA1C in the last 72 hours. CBG: No  results for input(s): GLUCAP in the last 168 hours. Lipid Profile: No results for input(s): CHOL, HDL, LDLCALC, TRIG, CHOLHDL, LDLDIRECT in the last 72 hours. Thyroid Function Tests: No results for input(s): TSH, T4TOTAL, FREET4, T3FREE, THYROIDAB in  the last 72 hours. Anemia Panel: No results for input(s): VITAMINB12, FOLATE, FERRITIN, TIBC, IRON, RETICCTPCT in the last 72 hours. Urine analysis:    Component Value Date/Time   COLORURINE STRAW (A) 12/30/2016 1427   APPEARANCEUR CLEAR 12/30/2016 1427   LABSPEC 1.003 (L) 12/30/2016 1427   PHURINE 5.0 12/30/2016 1427   GLUCOSEU NEGATIVE 12/30/2016 1427   HGBUR NEGATIVE 12/30/2016 [place] 12/30/2016 1427   [place] 12/30/2016 1427   PROTEINUR NEGATIVE 12/30/2016 1427   NITRITE NEGATIVE 12/30/2016 1427   LEUKOCYTESUR NEGATIVE 12/30/2016 1427    Radiological Exams on Admission: DG Chest Port 1 View  Result Date: 12/25/2020 CLINICAL DATA:  Cough, wheezing and shortness of breath for the past 3 days. EXAM: PORTABLE CHEST 1 VIEW COMPARISON:  None. FINDINGS: The heart size and mediastinal contours are within normal limits. Both lungs are clear. The visualized skeletal structures are unremarkable. IMPRESSION: No active disease. Electronically Signed   By: Claudie Revering M.D.   On: 12/25/2020 14:32    EKG: Independently reviewed.  Sinus tachycardia  Assessment/Plan Active Problems:   GERD (gastroesophageal reflux disease)   Asthma exacerbation   Essential hypertension    Acute asthma exacerbation/acute viral laryngitis: Patient remained wheezy with constant cough despite of receiving multiple breathing treatments and Solu-Medrol.  We will continue her on Solu-Medrol 60 mg twice daily and scheduled as well as as needed bronchodilators.  COVID-negative.  Will check viral respiratory panel and influenza.  Hypertension: Stable.  Resume home medications.  Hyperglycemia: Blood sugar 236, likely secondary to recent  use of prednisone.  Recent hemoglobin A1c in December 2021 was within normal range.  We will recheck today.  GERD: Resume home dose of PPI and Tums.  DVT prophylaxis: enoxaparin (LOVENOX) injection 40 mg Start: 12/25/20 1900 Code Status: Full code Family Communication: None present at bedside.  Plan of care discussed with patient in length and she verbalized understanding and agreed with it. Disposition Plan: Possibly discharge in 24 to 48 hours Consults called: None Admission status: Observation   Status is: Observation  The patient remains OBS appropriate and will d/c before 2 midnights.  Dispo: The patient is from: Home              Anticipated d/c is to: Home              Patient currently is not medically stable to d/c.   Difficult to place patient No       Darliss Cheney MD [facility]  12/25/2020, 6:56 PM  To contact the attending provider between 7A-7P or the covering provider during after hours 7P-7A, please log into the web site [email or web address]

## 2020-12-25 NOTE — ED Provider Notes (Signed)
Medical screening examination/treatment/procedure(s) were conducted as a shared visit with non-physician practitioner(s) and myself.  I personally evaluated the patient during the encounter.  Clinical Impression:   Final diagnoses:  Moderate persistent asthma with exacerbation  Acute respiratory failure with hypoxia  Pt with cough / bronchitis - with laryngitis - has ongoing cough - and squeeqy voice - no edema, clear heart sounds - diffuse wheezing but improving Had steroids Albuterol nebs No infiltrate on xray  WBC up likely from steroids she has been on for a couple of days.  This patient is in significant respiratory distress, after multiple breathing treatments there was some improvement but she continued to require continuous nebulizer treatments and will need to be admitted to the hospital.  She cannot ambulate without severe distress.  The patient is critically ill, please see separate documentation from physician assistant  CRITICAL CARE Performed by: Johnna Acosta Total critical care time: 35 minutes Critical care time was exclusive of separately billable procedures and treating other patients. Critical care was necessary to treat or prevent imminent or life-threatening deterioration. Critical care was time spent personally by me on the following activities: development of treatment plan with patient and/or surrogate as well as nursing, discussions with consultants, evaluation of patient's response to treatment, examination of patient, obtaining history from patient or surrogate, ordering and performing treatments and interventions, ordering and review of laboratory studies, ordering and review of radiographic studies, pulse oximetry and re-evaluation of patient's condition.   Agree with tx plan   Noemi Chapel, MD 12/27/20 [phone]

## 2020-12-25 NOTE — ED Triage Notes (Signed)
Pt c/o sob and cough that started Thursday. Pt seen pcp on Friday and started on cough meds, prednisone and breathing treatments with no relief. Pt states shes been talking all meds as directed and breathing treatments every 4-6 hours.

## 2020-12-26 DIAGNOSIS — M5416 Radiculopathy, lumbar region: Secondary | ICD-10-CM | POA: Diagnosis present

## 2020-12-26 DIAGNOSIS — F419 Anxiety disorder, unspecified: Secondary | ICD-10-CM | POA: Diagnosis present

## 2020-12-26 DIAGNOSIS — J4531 Mild persistent asthma with (acute) exacerbation: Secondary | ICD-10-CM | POA: Diagnosis not present

## 2020-12-26 DIAGNOSIS — Z20822 Contact with and (suspected) exposure to covid-19: Secondary | ICD-10-CM | POA: Diagnosis present

## 2020-12-26 DIAGNOSIS — I10 Essential (primary) hypertension: Secondary | ICD-10-CM | POA: Diagnosis present

## 2020-12-26 DIAGNOSIS — J4541 Moderate persistent asthma with (acute) exacerbation: Secondary | ICD-10-CM | POA: Diagnosis present

## 2020-12-26 DIAGNOSIS — M8700 Idiopathic aseptic necrosis of unspecified bone: Secondary | ICD-10-CM | POA: Diagnosis present

## 2020-12-26 DIAGNOSIS — J4521 Mild intermittent asthma with (acute) exacerbation: Secondary | ICD-10-CM | POA: Diagnosis not present

## 2020-12-26 DIAGNOSIS — R7302 Impaired glucose tolerance (oral): Secondary | ICD-10-CM | POA: Diagnosis present

## 2020-12-26 DIAGNOSIS — K219 Gastro-esophageal reflux disease without esophagitis: Secondary | ICD-10-CM | POA: Diagnosis present

## 2020-12-26 DIAGNOSIS — Z8673 Personal history of transient ischemic attack (TIA), and cerebral infarction without residual deficits: Secondary | ICD-10-CM | POA: Diagnosis not present

## 2020-12-26 DIAGNOSIS — E7439 Other disorders of intestinal carbohydrate absorption: Secondary | ICD-10-CM | POA: Diagnosis present

## 2020-12-26 DIAGNOSIS — F32A Depression, unspecified: Secondary | ICD-10-CM | POA: Diagnosis present

## 2020-12-26 DIAGNOSIS — R7303 Prediabetes: Secondary | ICD-10-CM | POA: Diagnosis present

## 2020-12-26 DIAGNOSIS — R739 Hyperglycemia, unspecified: Secondary | ICD-10-CM | POA: Diagnosis present

## 2020-12-26 DIAGNOSIS — M797 Fibromyalgia: Secondary | ICD-10-CM | POA: Diagnosis present

## 2020-12-26 DIAGNOSIS — J208 Acute bronchitis due to other specified organisms: Secondary | ICD-10-CM | POA: Diagnosis present

## 2020-12-26 DIAGNOSIS — Z87891 Personal history of nicotine dependence: Secondary | ICD-10-CM | POA: Diagnosis not present

## 2020-12-26 DIAGNOSIS — Z7951 Long term (current) use of inhaled steroids: Secondary | ICD-10-CM | POA: Diagnosis not present

## 2020-12-26 DIAGNOSIS — J040 Acute laryngitis: Secondary | ICD-10-CM | POA: Diagnosis present

## 2020-12-26 DIAGNOSIS — Z79899 Other long term (current) drug therapy: Secondary | ICD-10-CM | POA: Diagnosis not present

## 2020-12-26 DIAGNOSIS — Z9049 Acquired absence of other specified parts of digestive tract: Secondary | ICD-10-CM | POA: Diagnosis not present

## 2020-12-26 LAB — CBC
HCT: 37.5 % (ref 36.0–46.0)
Hemoglobin: 11.8 g/dL — ABNORMAL LOW (ref 12.0–15.0)
MCH: 29.4 pg (ref 26.0–34.0)
MCHC: 31.5 g/dL (ref 30.0–36.0)
MCV: 93.5 fL (ref 80.0–100.0)
Platelets: 326 10*3/uL (ref 150–400)
RBC: 4.01 MIL/uL (ref 3.87–5.11)
RDW: 15.3 % (ref 11.5–15.5)
WBC: 16.2 10*3/uL — ABNORMAL HIGH (ref 4.0–10.5)
nRBC: 0 % (ref 0.0–0.2)

## 2020-12-26 LAB — BASIC METABOLIC PANEL
Anion gap: 7 (ref 5–15)
BUN: 15 mg/dL (ref 8–23)
CO2: 19 mmol/L — ABNORMAL LOW (ref 22–32)
Calcium: 9 mg/dL (ref 8.9–10.3)
Chloride: 115 mmol/L — ABNORMAL HIGH (ref 98–111)
Creatinine, Ser: 0.9 mg/dL (ref 0.44–1.00)
GFR, Estimated: >60 mL/min (ref >60)
Glucose, Bld: 128 mg/dL — ABNORMAL HIGH (ref 70–99)
Potassium: 3.9 mmol/L (ref 3.5–5.1)
Sodium: 141 mmol/L (ref 135–145)

## 2020-12-26 LAB — HEMOGLOBIN A1C
Hgb A1c MFr Bld: 5.8 % — ABNORMAL HIGH (ref 4.8–5.6)
Mean Plasma Glucose: 119.76 mg/dL

## 2020-12-26 LAB — HIV ANTIBODY (ROUTINE TESTING W REFLEX): HIV Screen 4th Generation wRfx: NONREACTIVE

## 2020-12-26 MED ORDER — HYDROCOD POLST-CPM POLST ER 10-8 MG/5ML PO SUER
5.0000 mL | Freq: Two times a day (BID) | ORAL | Status: DC
Start: 2020-12-26 — End: 2020-12-28
  Administered 2020-12-26 – 2020-12-28 (×5): 5 mL via ORAL
  Filled 2020-12-26 (×5): qty 5

## 2020-12-26 MED ORDER — PROCHLORPERAZINE EDISYLATE 10 MG/2ML IJ SOLN
5.0000 mg | Freq: Once | INTRAMUSCULAR | Status: AC
  Administered 2020-12-26: 5 mg via INTRAVENOUS
  Filled 2020-12-26: qty 2

## 2020-12-26 MED ORDER — DIPHENHYDRAMINE HCL 50 MG/ML IJ SOLN
12.5000 mg | Freq: Once | INTRAMUSCULAR | Status: AC
  Administered 2020-12-26: 12.5 mg via INTRAVENOUS
  Filled 2020-12-26: qty 1

## 2020-12-26 MED ORDER — BUDESONIDE 0.5 MG/2ML IN SUSP
0.5000 mg | Freq: Two times a day (BID) | RESPIRATORY_TRACT | Status: DC
  Administered 2020-12-26 – 2020-12-28 (×5): 0.5 mg via RESPIRATORY_TRACT
  Filled 2020-12-26 (×5): qty 2

## 2020-12-26 NOTE — Progress Notes (Signed)
PROGRESS NOTE  [name] VHQ:[id number] DOB: 10-Jun-1956 DOA: 12/25/2020 PCP: Asencion Noble, MD  Brief History:  65 year old female with a history of asthma, hypertension, fibromyalgia, retinal artery occlusion presenting with 4-day history of progressive shortness of breath and increasing coughing.  She stated that she began having symptoms on 12/19/20 with some coughing and mild shortness of breath.  Her symptoms continue to progress despite using her nebulizer treatments and Symbicort at home.  She denies any recent travels or sick contacts.  She contacted her PCP on 12/23/2020.  She was given a prescription for prednisone which she began taking with no significant improvement.  She denies any fevers, chills, hemoptysis, nausea, vomiting, diarrhea, abdominal pain dysuria, hematuria.  She quit smoking in 1976 after only smoking briefly.  She has not been exposed to secondhand smoke. In the emergency department, the patient was afebrile hemodynamically stable.  She was tachycardic into 110s.  She was given an hour-long nebulizer treatment with albuterol with some improvement.  However after the patient was ambulated she had increasing shortness of breath and tachycardia and tachypnea.  As result, the patient was admitted for further evaluation.  BMP was unremarkable with sodium 141, potassium 3.9, serum creatinine 0.90.  WBC 13.8, hemoglobin 11.8, platelets 328.  Chest x-ray was negative for any infiltrates.  Assessment/Plan: Acute asthma exacerbation -The patient remains intermittently short of breath with mild exertion -Continue IV Solu-Medrol -Add Pulmicort -Continue duo nebs -Personally reviewed chest x-ray--no infiltrates.  Increased interstitial markings. -Viral respiratory panel -Add antitussive  Essential hypertension -Continue amlodipine  Chronic daily headache -Continue Topamax  Hyperglycemia -Check hemoglobin A1c  GERD -Continue PPI  Anxiety/depression -Continue  home dose alprazolam      Status is: Observation  The patient will require care spanning > 2 midnights and should be moved to inpatient because: IV treatments appropriate due to intensity of illness or inability to take PO  Dispo: The patient is from: Home              Anticipated d/c is to: Home              Patient currently is not medically stable to d/c.   Difficult to place patient No        Family Communication:   No Family at bedside  Consultants:  none  Code Status:  FULL  DVT Prophylaxis:  [place] Lovenox   Procedures: As Listed in Progress Note Above  Antibiotics: None       Subjective: The patient continues to have shortness of breath with minimal exertion.  She continues to have a nonproductive cough.  She denies any fevers, chills, nausea, vomiting diarrhea.  She has some chest tightness with coughing  Objective: Vitals:   12/25/20 2156 12/25/20 2200 12/25/20 2350 12/26/20 0610  BP: 130/64   129/68  Pulse: (!) 109   94  Resp: 18   18  Temp: 98.1 F (36.7 C)   97.8 F (36.6 C)  TempSrc: Oral   Oral  SpO2: 98%  99% 97%  Weight:  78.9 kg    Height:  5\' 7"  (1.702 m)     No intake or output data in the 24 hours ending 12/26/20 0702 Weight change:  Exam:   General:  Pt is alert, follows commands appropriately, not in acute distress  HEENT: No icterus, No thrush, No neck mass, [place]/AT  Cardiovascular: RRR, S1/S2, no rubs, no gallops  Respiratory: Bilateral expiratory wheeze.  Bilateral rales.    Abdomen: Soft/+BS, non tender, non distended, no guarding  Extremities: No edema, No lymphangitis, No petechiae, No rashes, no synovitis   Data Reviewed: I have personally reviewed following labs and imaging studies Basic Metabolic Panel: Recent Labs  Lab 12/25/20 1422 12/26/20 0432  NA 139 141  K 4.1 3.9  CL 110 115*  CO2 22 19*  GLUCOSE 236* 128*  BUN 16 15  CREATININE 0.99 0.90  CALCIUM 8.7* 9.0   Liver Function Tests: No results  for input(s): AST, ALT, ALKPHOS, BILITOT, PROT, ALBUMIN in the last 168 hours. No results for input(s): LIPASE, AMYLASE in the last 168 hours. No results for input(s): AMMONIA in the last 168 hours. Coagulation Profile: No results for input(s): INR, PROTIME in the last 168 hours. CBC: Recent Labs  Lab 12/25/20 1422 12/26/20 0432  WBC 13.8* 16.2*  NEUTROABS 12.7*  --   HGB 11.7* 11.8*  HCT 37.6 37.5  MCV 94.0 93.5  PLT 318 326   Cardiac Enzymes: No results for input(s): CKTOTAL, CKMB, CKMBINDEX, TROPONINI in the last 168 hours. BNP: Invalid input(s): POCBNP CBG: No results for input(s): GLUCAP in the last 168 hours. HbA1C: No results for input(s): HGBA1C in the last 72 hours. Urine analysis:    Component Value Date/Time   COLORURINE STRAW (A) 12/30/2016 1427   APPEARANCEUR CLEAR 12/30/2016 1427   LABSPEC 1.003 (L) 12/30/2016 1427   PHURINE 5.0 12/30/2016 1427   GLUCOSEU NEGATIVE 12/30/2016 1427   HGBUR NEGATIVE 12/30/2016 1427   BILIRUBINUR NEGATIVE 12/30/2016 1427   [place] 12/30/2016 1427   PROTEINUR NEGATIVE 12/30/2016 1427   NITRITE NEGATIVE 12/30/2016 1427   LEUKOCYTESUR NEGATIVE 12/30/2016 1427   Sepsis Labs: @LABRCNTIP (procalcitonin:4,lacticidven:4) ) Recent Results (from the past 240 hour(s))  Resp Panel by RT-PCR (Flu A&B, Covid) Nasopharyngeal Swab     Status: None   Collection Time: 12/25/20  2:25 PM   Specimen: Nasopharyngeal Swab; Nasopharyngeal(NP) swabs in vial transport medium  Result Value Ref Range Status   SARS Coronavirus 2 by RT PCR NEGATIVE NEGATIVE Final    Comment: (NOTE) SARS-CoV-2 target nucleic acids are NOT DETECTED.  The SARS-CoV-2 RNA is generally detectable in upper respiratory specimens during the acute phase of infection. The lowest concentration of SARS-CoV-2 viral copies this assay can detect is 138 copies/mL. A negative result does not preclude SARS-Cov-2 infection and should not be used as the sole basis for  treatment or other patient management decisions. A negative result may occur with  improper specimen collection/handling, submission of specimen other than nasopharyngeal swab, presence of viral mutation(s) within the areas targeted by this assay, and inadequate number of viral copies(<138 copies/mL). A negative result must be combined with clinical observations, patient history, and epidemiological information. The expected result is Negative.  Fact Sheet for Patients:  EntrepreneurPulse.com.au  Fact Sheet for Healthcare Providers:  IncredibleEmployment.be  This test is no t yet approved or cleared by the [place] FDA and  has been authorized for detection and/or diagnosis of SARS-CoV-2 by FDA under an Emergency Use Authorization (EUA). This EUA will remain  in effect (meaning this test can be used) for the duration of the COVID-19 declaration under Section 564(b)(1) of the Act, 21 U.S.C.section 360bbb-3(b)(1), unless the authorization is terminated  or revoked sooner.       Influenza A by PCR NEGATIVE NEGATIVE Final   Influenza B by PCR NEGATIVE NEGATIVE Final    Comment: (NOTE) The Xpert Xpress SARS-CoV-2/FLU/RSV plus assay is intended as  an aid in the diagnosis of influenza from Nasopharyngeal swab specimens and should not be used as a sole basis for treatment. Nasal washings and aspirates are unacceptable for Xpert Xpress SARS-CoV-2/FLU/RSV testing.  Fact Sheet for Patients: EntrepreneurPulse.com.au  Fact Sheet for Healthcare Providers: IncredibleEmployment.be  This test is not yet approved or cleared by the [place] FDA and has been authorized for detection and/or diagnosis of SARS-CoV-2 by FDA under an Emergency Use Authorization (EUA). This EUA will remain in effect (meaning this test can be used) for the duration of the COVID-19 declaration under Section 564(b)(1) of the Act, 21  U.S.C. section 360bbb-3(b)(1), unless the authorization is terminated or revoked.  Performed at [facility], [address] [place], [place] [address]      Scheduled Meds: . amLODipine  5 mg Oral Daily  . calcium carbonate  1 tablet Oral BID  . enoxaparin (LOVENOX) injection  40 mg Subcutaneous Q24H  . guaiFENesin  2,400 mg Oral BID  . ipratropium-albuterol  3 mL Nebulization Q6H  . lubiprostone  24 mcg Oral BID  . methocarbamol  750 mg Oral QHS  . methylPREDNISolone (SOLU-MEDROL) injection  60 mg Intravenous Q12H  . mometasone-formoterol  2 puff Inhalation BID  . multivitamin-prenatal  1 tablet Oral Q1200  . pantoprazole  40 mg Oral BID AC  . simethicone  80 mg Oral BID  . topiramate  200 mg Oral BID   Continuous Infusions: . sodium chloride 75 mL/hr at 12/25/20 2348    Procedures/Studies: DG Chest Port 1 View  Result Date: 12/25/2020 CLINICAL DATA:  Cough, wheezing and shortness of breath for the past 3 days. EXAM: PORTABLE CHEST 1 VIEW COMPARISON:  None. FINDINGS: The heart size and mediastinal contours are within normal limits. Both lungs are clear. The visualized skeletal structures are unremarkable. IMPRESSION: No active disease. Electronically Signed   By: Claudie Revering M.D.   On: 12/25/2020 14:32    Orson Eva, DO  [facility]  If 7PM-7AM, please contact night-coverage [email or web address] Password [email or web address] 12/26/2020, 7:02 AM   LOS: 0 days

## 2020-12-27 DIAGNOSIS — J4531 Mild persistent asthma with (acute) exacerbation: Secondary | ICD-10-CM

## 2020-12-27 DIAGNOSIS — J4521 Mild intermittent asthma with (acute) exacerbation: Secondary | ICD-10-CM

## 2020-12-27 MED ORDER — METHYLPREDNISOLONE SODIUM SUCC 125 MG IJ SOLR
60.0000 mg | Freq: Three times a day (TID) | INTRAMUSCULAR | Status: DC
  Administered 2020-12-27 – 2020-12-28 (×4): 60 mg via INTRAVENOUS
  Filled 2020-12-27 (×4): qty 2

## 2020-12-27 NOTE — Plan of Care (Signed)

## 2020-12-27 NOTE — Progress Notes (Signed)
PROGRESS NOTE  [name] XLK:[id number] DOB: 10/07/1955 DOA: 12/25/2020 PCP: Asencion Noble, MD  Brief History:  65 year old female with a history of asthma, hypertension, fibromyalgia, retinal artery occlusion presenting with 4-day history of progressive shortness of breath and increasing coughing.  She stated that she began having symptoms on 12/19/20 with some coughing and mild shortness of breath.  Her symptoms continue to progress despite using her nebulizer treatments and Symbicort at home.  She denies any recent travels or sick contacts.  She contacted her PCP on 12/23/2020.  She was given a prescription for prednisone which she began taking with no significant improvement.  She denies any fevers, chills, hemoptysis, nausea, vomiting, diarrhea, abdominal pain dysuria, hematuria.  She quit smoking in 1976 after only smoking briefly.  She has not been exposed to secondhand smoke. In the emergency department, the patient was afebrile hemodynamically stable.  She was tachycardic into 110s.  She was given an hour-long nebulizer treatment with albuterol with some improvement.  However after the patient was ambulated she had increasing shortness of breath and tachycardia and tachypnea.  As result, the patient was admitted for further evaluation.  BMP was unremarkable with sodium 141, potassium 3.9, serum creatinine 0.90.  WBC 13.8, hemoglobin 11.8, platelets 328.  Chest x-ray was negative for any infiltrates.  Assessment/Plan: Acute asthma exacerbation -The patient remains intermittently short of breath with mild exertion -pulmonary consult -Continue IV Solu-Medrol-->increased to q 8 hours -Continue Pulmicort -Continue duo nebs -Personally reviewed chest x-ray--no infiltrates.  Increased interstitial markings. -Viral respiratory panel--pending -Add antitussive  Essential hypertension -Continue amlodipine  Chronic daily headache -Continue Topamax  Hyperglycemia/Impaired glucose  tolerance -Check hemoglobin A1c--5.8  GERD -Continue PPI  Anxiety/depression -Continue home dose alprazolam      Status is: Inpatient  The patient will require care spanning > 2 midnights and should be moved to inpatient because: IV treatments appropriate due to intensity of illness or inability to take PO  Dispo: The patient is from: Home  Anticipated d/c is to: Home  Patient currently is not medically stable to d/c.              Difficult to place patient No        Family Communication:   No Family at bedside  Consultants:  none  Code Status:  FULL  DVT Prophylaxis:  [place] Lovenox   Procedures: As Listed in Progress Note Above  Antibiotics: None     Subjective: Patient feels like today is a "bad fibromyalgia day".  Feels her breathing is a little better, but sob with exertion.  Denies f/c, n/v/d, abd pain  Objective: Vitals:   12/27/20 0111 12/27/20 0114 12/27/20 0531 12/27/20 0732  BP: (!) 133/96  (!) 147/83   Pulse: 96  80   Resp: 19  16   Temp: 98.1 F (36.7 C)  (!) 97.2 F (36.2 C)   TempSrc:      SpO2: 94% 96% 99% 99%  Weight:      Height:        Intake/Output Summary (Last 24 hours) at 12/27/2020 1248 Last data filed at 12/27/2020 0900 Gross per 24 hour  Intake 720 ml  Output --  Net 720 ml   Weight change:  Exam:   General:  Pt is alert, follows commands appropriately, not in acute distress  HEENT: No icterus, No thrush, No neck mass, [place]/AT  Cardiovascular: RRR, S1/S2, no rubs, no gallops  Respiratory: scattered  rales.  Bilateral wheeze  Abdomen: Soft/+BS, non tender, non distended, no guarding  Extremities: No edema, No lymphangitis, No petechiae, No rashes, no synovitis   Data Reviewed: I have personally reviewed following labs and imaging studies Basic Metabolic Panel: Recent Labs  Lab 12/25/20 1422 12/26/20 0432  NA 139 141  K 4.1 3.9  CL 110 115*  CO2 22 19*   GLUCOSE 236* 128*  BUN 16 15  CREATININE 0.99 0.90  CALCIUM 8.7* 9.0   Liver Function Tests: No results for input(s): AST, ALT, ALKPHOS, BILITOT, PROT, ALBUMIN in the last 168 hours. No results for input(s): LIPASE, AMYLASE in the last 168 hours. No results for input(s): AMMONIA in the last 168 hours. Coagulation Profile: No results for input(s): INR, PROTIME in the last 168 hours. CBC: Recent Labs  Lab 12/25/20 1422 12/26/20 0432  WBC 13.8* 16.2*  NEUTROABS 12.7*  --   HGB 11.7* 11.8*  HCT 37.6 37.5  MCV 94.0 93.5  PLT 318 326   Cardiac Enzymes: No results for input(s): CKTOTAL, CKMB, CKMBINDEX, TROPONINI in the last 168 hours. BNP: Invalid input(s): POCBNP CBG: No results for input(s): GLUCAP in the last 168 hours. HbA1C: Recent Labs    12/26/20 0432  HGBA1C 5.8*   Urine analysis:    Component Value Date/Time   COLORURINE STRAW (A) 12/30/2016 1427   APPEARANCEUR CLEAR 12/30/2016 1427   LABSPEC 1.003 (L) 12/30/2016 1427   PHURINE 5.0 12/30/2016 1427   GLUCOSEU NEGATIVE 12/30/2016 1427   HGBUR NEGATIVE 12/30/2016 1427   BILIRUBINUR NEGATIVE 12/30/2016 1427   KETONESUR NEGATIVE 12/30/2016 1427   PROTEINUR NEGATIVE 12/30/2016 1427   NITRITE NEGATIVE 12/30/2016 1427   LEUKOCYTESUR NEGATIVE 12/30/2016 1427   Sepsis Labs: @LABRCNTIP (procalcitonin:4,lacticidven:4) ) Recent Results (from the past 240 hour(s))  Resp Panel by RT-PCR (Flu A&B, Covid) Nasopharyngeal Swab     Status: None   Collection Time: 12/25/20  2:25 PM   Specimen: Nasopharyngeal Swab; Nasopharyngeal(NP) swabs in vial transport medium  Result Value Ref Range Status   SARS Coronavirus 2 by RT PCR NEGATIVE NEGATIVE Final    Comment: (NOTE) SARS-CoV-2 target nucleic acids are NOT DETECTED.  The SARS-CoV-2 RNA is generally detectable in upper respiratory specimens during the acute phase of infection. The lowest concentration of SARS-CoV-2 viral copies this assay can detect is 138 copies/mL. A  negative result does not preclude SARS-Cov-2 infection and should not be used as the sole basis for treatment or other patient management decisions. A negative result may occur with  improper specimen collection/handling, submission of specimen other than nasopharyngeal swab, presence of viral mutation(s) within the areas targeted by this assay, and inadequate number of viral copies(<138 copies/mL). A negative result must be combined with clinical observations, patient history, and epidemiological information. The expected result is Negative.  Fact Sheet for Patients:  EntrepreneurPulse.com.au  Fact Sheet for Healthcare Providers:  IncredibleEmployment.be  This test is no t yet approved or cleared by the [place] FDA and  has been authorized for detection and/or diagnosis of SARS-CoV-2 by FDA under an Emergency Use Authorization (EUA). This EUA will remain  in effect (meaning this test can be used) for the duration of the COVID-19 declaration under Section 564(b)(1) of the Act, 21 U.S.C.section 360bbb-3(b)(1), unless the authorization is terminated  or revoked sooner.       Influenza A by PCR NEGATIVE NEGATIVE Final   Influenza B by PCR NEGATIVE NEGATIVE Final    Comment: (NOTE) The Xpert Xpress SARS-CoV-2/FLU/RSV plus assay is  intended as an aid in the diagnosis of influenza from Nasopharyngeal swab specimens and should not be used as a sole basis for treatment. Nasal washings and aspirates are unacceptable for Xpert Xpress SARS-CoV-2/FLU/RSV testing.  Fact Sheet for Patients: EntrepreneurPulse.com.au  Fact Sheet for Healthcare Providers: IncredibleEmployment.be  This test is not yet approved or cleared by the [place] FDA and has been authorized for detection and/or diagnosis of SARS-CoV-2 by FDA under an Emergency Use Authorization (EUA). This EUA will remain in effect (meaning this test can  be used) for the duration of the COVID-19 declaration under Section 564(b)(1) of the Act, 21 U.S.C. section 360bbb-3(b)(1), unless the authorization is terminated or revoked.  Performed at [facility], [address], [place], [place] [address]      Scheduled Meds: . amLODipine  5 mg Oral Daily  . budesonide (PULMICORT) nebulizer solution  0.5 mg Nebulization BID  . calcium carbonate  1 tablet Oral BID  . chlorpheniramine-HYDROcodone  5 mL Oral Q12H  . enoxaparin (LOVENOX) injection  40 mg Subcutaneous Q24H  . guaiFENesin  2,400 mg Oral BID  . ipratropium-albuterol  3 mL Nebulization Q6H  . lubiprostone  24 mcg Oral BID  . methocarbamol  750 mg Oral QHS  . methylPREDNISolone (SOLU-MEDROL) injection  60 mg Intravenous Q8H  . multivitamin-prenatal  1 tablet Oral Q1200  . pantoprazole  40 mg Oral BID AC  . simethicone  80 mg Oral BID  . topiramate  200 mg Oral BID   Continuous Infusions:  Procedures/Studies: DG Chest Port 1 View  Result Date: 12/25/2020 CLINICAL DATA:  Cough, wheezing and shortness of breath for the past 3 days. EXAM: PORTABLE CHEST 1 VIEW COMPARISON:  None. FINDINGS: The heart size and mediastinal contours are within normal limits. Both lungs are clear. The visualized skeletal structures are unremarkable. IMPRESSION: No active disease. Electronically Signed   By: Claudie Revering M.D.   On: 12/25/2020 14:32    Orson Eva, DO  [facility]  If 7PM-7AM, please contact night-coverage [email or web address] Password [email or web address] 12/27/2020, 12:48 PM   LOS: 1 day

## 2020-12-27 NOTE — Consult Note (Signed)
Name: [name] MRN: [id number] DOB: Sep 07, 1955    ADMISSION DATE:  12/25/2020 CONSULTATION DATE:  12/27/2020   REFERRING MD :  Carles Collet TRH  CHIEF COMPLAINT: Asthma exacerbation  BRIEF PATIENT DESCRIPTION: 65 year old never smoker with slow to resolve asthma exacerbation  SIGNIFICANT EVENTS    STUDIES:  Esophageal manometry/pH impedance study 10/2020 normal Spirometry 08/2017 no airway obstruction  HISTORY OF PRESENT ILLNESS: 65 year old woman with a history of asthma for 40 years.  She previously was a patient of Dr. Luan Pulling.  She has been maintained on regimen of Symbicort twice daily which she takes religiously.  Her triggers include weather changes, infections.  Her last exacerbation was 3 years ago requiring prednisone.  She smoked less than 5 pack years and quit in 1976.  She had previous allergy evaluation and was only noted to be allergic to cats, she has always had 2 cats at home. Her current issues started 2 weeks ago with a URI type illness, she took over-the-counter medications and this seemed to improve somewhat and then 6 days ago wheezing and coughing got worse she contacted her PCP Dr. Willey Blade got a prescription of prednisone but required hospital admission on 5/8.  She needed an hour-long nebulizer treatment in the ED, chest x-ray did not show any infiltrates, COVID testing was negative. She was treated with IV Solu-Medrol and duo nebs. On 5/9 Pulmicort was added and respiratory viral panel was ordered  Today she continues to have bouts of coughing, dry cough and wheezing.  About 3 days ago she developed acute left-sided chest pain and this has persisted and she has to splint her chest when coughing  PAST MEDICAL HISTORY :   has a past medical history of Abnormal Pap smear of cervix, Anxiety, Arthritis, Asthma, Depression, Essential hypertension, Fibromyalgia, GERD (gastroesophageal reflux disease), History of stroke, PVC's (premature ventricular contractions), Sciatica  of right side, and Stroke ([place]).  has a past surgical history that includes Appendectomy; Tonsillectomy; Bunionectomy (Bilateral); Tubal ligation; Colonoscopy (10/08/2011); Adenoidectomy; Chondroplasty (Right, 06/19/2018); Knee surgery (05/2018); Foot surgery (08/2018); Esophagogastroduodenoscopy (N/A, 03/12/2019); polypectomy (03/12/2019); Foot surgery (05/14/2019); Esophagogastroduodenoscopy (egd) with propofol (N/A, 08/24/2019); Total hip arthroplasty (Right, 07/08/2020); Esophageal manometry (N/A, 10/31/2020); PH impedance study (N/A, 10/31/2020); and 24 hour ph study (10/31/2020). Prior to Admission medications   Medication Sig Start Date End Date Taking? Authorizing Provider  ALPRAZolam Duanne Moron) 1 MG tablet Take 1 mg by mouth at bedtime as needed for anxiety. 08/17/17  Yes [provider]  amLODipine (NORVASC) 5 MG tablet Take 5 mg by mouth daily. 07/04/17  Yes [provider]  benzonatate (TESSALON) 200 MG capsule Take 200 mg by mouth 3 (three) times daily as needed. 12/23/20  Yes [provider]  budesonide-formoterol (SYMBICORT) 160-4.5 MCG/ACT inhaler Inhale 2 puffs into the lungs 2 (two) times daily.   Yes [provider]  Calcium Carbonate-Simethicone (ALKA-SELTZER HEARTBURN + GAS) 750-80 MG CHEW Chew 2 tablets by mouth 2 (two) times daily. Chews 2 a day.   Yes [provider]  diphenhydrAMINE (BENADRYL) 25 mg capsule Take 25 mg by mouth as needed for itching or allergies.   Yes [provider]  EPINEPHrine 0.3 mg/0.3 mL IJ SOAJ injection Inject 0.3 mg into the muscle as needed for anaphylaxis.   Yes [provider]  Guaifenesin 1200 MG TB12 Take 2,400 mg by mouth 2 (two) times daily.   Yes [provider]  ipratropium-albuterol (DUONEB) 0.5-2.5 (3) MG/3ML SOLN Inhale 3 mLs into the lungs every 4 (four)  hours as needed (shortness of breath). 12/23/20  Yes [provider]  lubiprostone (AMITIZA) 24 MCG capsule Take 24 mcg by  mouth 2 (two) times daily. 07/11/20  Yes [provider]  methocarbamol (ROBAXIN) 750 MG tablet Take 750 mg by mouth at bedtime. 08/16/20  Yes [provider]  pantoprazole (PROTONIX) 40 MG tablet TAKE ONE TABLET (40MG  TOTAL) BY MOUTH TWO TIMES DAILY BEFORE A MEAL Patient taking differently: Take 40 mg by mouth 2 (two) times daily before a meal. 11/14/20  Yes Jodi Mourning, Kristen S, PA-C  predniSONE (DELTASONE) 10 MG tablet Take 10 mg by mouth See admin instructions. Sterapred 12 day taper schedule 12/23/20  Yes [provider]  Prenatal Vit-Fe Fumarate-FA (MULTIVITAMIN-PRENATAL) 27-0.8 MG TABS tablet Take 1 tablet by mouth daily at 12 noon.   Yes [provider]  PROAIR HFA 108 (90 Base) MCG/ACT inhaler Inhale 2 puffs into the lungs as needed for wheezing or shortness of breath.   Yes [provider]  Sodium Bicarbonate POWD by Does not apply route. Takes 1/2 teaspoon full twice daily.   Yes [provider]  topiramate (TOPAMAX) 200 MG tablet Take 200 mg by mouth 2 (two) times daily.    Yes [provider]  VITAMIN D PO Take 1 tablet by mouth daily.   Yes [provider]   No Known Allergies  FAMILY HISTORY:  family history includes AAA (abdominal aortic aneurysm) in her father; COPD in her brother; Cancer in her brother; Diverticulosis in her mother; Hypertension in her father and mother. SOCIAL HISTORY:  reports that she quit smoking about 45 years ago. Her smoking use included cigarettes. She has a 0.25 pack-year smoking history. She has never used smokeless tobacco. She reports that she does not drink alcohol and does not use drugs.  REVIEW OF SYSTEMS:   Constitutional: Negative for fever, chills, weight loss, malaise/fatigue and diaphoresis.  HENT: Negative for hearing loss, ear pain, nosebleeds, neck pain, tinnitus and ear discharge.   Eyes: Negative for blurred vision, double vision, photophobia, pain, discharge and redness.   Respiratory: Negative for hemoptysis, and stridor.   Cardiovascular: Negative for , palpitations, orthopnea, claudication, leg swelling and PND.  Gastrointestinal: Negative for heartburn, nausea, vomiting, abdominal pain, diarrhea, constipation, blood in stool and melena.  Genitourinary: Negative for dysuria, urgency, frequency, hematuria and flank pain.  Musculoskeletal: Negative for myalgias, back pain, joint pain and falls.  Skin: Negative for itching and rash.  Neurological: Negative for dizziness, tingling, tremors, sensory change, speech change, focal weakness, seizures, loss of consciousness, weakness and headaches.  Endo/Heme/Allergies: Negative for environmental allergies and polydipsia. Does not bruise/bleed easily.  SUBJECTIVE:   VITAL SIGNS: Temp:  [97.2 F (36.2 C)-98.6 F (37 C)] 97.2 F (36.2 C) (05/10 0531) Pulse Rate:  [80-96] 80 (05/10 0531) Resp:  [16-19] 16 (05/10 0531) BP: (123-153)/(63-96) 147/83 (05/10 0531) SpO2:  [94 %-99 %] 99 % (05/10 0732)  PHYSICAL EXAMINATION: Gen. Pleasant, well-nourished, in no distress, normal affect ENT - no pallor,icterus, no post nasal drip Neck: No JVD, no thyromegaly, no carotid bruits Lungs: no use of accessory muscles, no dullness to percussion, cBL faint scattered rhonchi , tenderness over left chest wall anterior axillary line Cardiovascular: Rhythm regular, heart sounds  normal, no murmurs or gallops, no peripheral edema Abdomen: soft and non-tender, no hepatosplenomegaly, BS normal. Musculoskeletal: No deformities, no cyanosis or clubbing Neuro:  alert, non focal   Recent Labs  Lab 12/25/20 1422 12/26/20 0432  NA 139 141  K 4.1 3.9  CL 110 115*  CO2 22 19*  BUN 16 15  CREATININE 0.99 0.90  GLUCOSE 236* 128*   Recent Labs  Lab 12/25/20 1422 12/26/20 0432  HGB 11.7* 11.8*  HCT 37.6 37.5  WBC 13.8* 16.2*  PLT 318 326   DG Chest Port 1 View  Result Date: 12/25/2020 CLINICAL DATA:  Cough, wheezing and  shortness of breath for the past 3 days. EXAM: PORTABLE CHEST 1 VIEW COMPARISON:  None. FINDINGS: The heart size and mediastinal contours are within normal limits. Both lungs are clear. The visualized skeletal structures are unremarkable. IMPRESSION: No active disease. Electronically Signed   By: Claudie Revering M.D.   On: 12/25/2020 14:32    ASSESSMENT / PLAN:  Slow to resolve asthma exacerbation. Likely triggered by viral bronchitis agree with respiratory viral panel that has been sent with specifically like to rule out metapneumovirus infection which would have a similar long course  10/2021AEC 300   Plan -increase IV Solu-Medrol 60 every 8. Continue albuterol nebs every 4 hours as needed She does not appear to be ready for discharge today Await respiratory viral panel  Musculoskeletal chest pain -likely from rib strain due to coughing, no evidence of rib fracture  -Incentive spirometry to prevent splinting   We will certainly provide her with pulmonary clinic follow-up at discharge  [name] MD. FCCP. [facility] Pager : [phone]  If no response to pager , please call [phone] until 7 pm After 7:00 pm call Elink  [phone]     12/27/2020, 12:17 PM

## 2020-12-28 ENCOUNTER — Ambulatory Visit (HOSPITAL_COMMUNITY)
Admission: RE | Admit: 2020-12-28 | Discharge: 2020-12-28 | Disposition: A | Discharge status: Home / Self Care | Payer: PPO | Source: Ambulatory Visit | Attending: Orthopedic Surgery | Admitting: Orthopedic Surgery

## 2020-12-28 DIAGNOSIS — M5416 Radiculopathy, lumbar region: Secondary | ICD-10-CM

## 2020-12-28 DIAGNOSIS — M8700 Idiopathic aseptic necrosis of unspecified bone: Secondary | ICD-10-CM | POA: Diagnosis not present

## 2020-12-28 DIAGNOSIS — M545 Low back pain, unspecified: Secondary | ICD-10-CM | POA: Diagnosis not present

## 2020-12-28 DIAGNOSIS — S73192A Other sprain of left hip, initial encounter: Secondary | ICD-10-CM | POA: Diagnosis not present

## 2020-12-28 DIAGNOSIS — M1612 Unilateral primary osteoarthritis, left hip: Secondary | ICD-10-CM | POA: Diagnosis not present

## 2020-12-28 DIAGNOSIS — M533 Sacrococcygeal disorders, not elsewhere classified: Secondary | ICD-10-CM | POA: Diagnosis not present

## 2020-12-28 DIAGNOSIS — M87852 Other osteonecrosis, left femur: Secondary | ICD-10-CM | POA: Diagnosis not present

## 2020-12-28 DIAGNOSIS — K219 Gastro-esophageal reflux disease without esophagitis: Secondary | ICD-10-CM

## 2020-12-28 LAB — RESPIRATORY PANEL BY PCR

## 2020-12-28 MED ORDER — GUAIFENESIN-DM 100-10 MG/5ML PO SYRP: 5.0000 mL | ORAL_SOLUTION | ORAL | Status: DC | PRN

## 2020-12-28 MED ORDER — LIDOCAINE 5 % EX PTCH
1.0000 | MEDICATED_PATCH | CUTANEOUS | Status: DC
  Administered 2020-12-28: 1 via TRANSDERMAL
  Filled 2020-12-28: qty 1

## 2020-12-28 MED ORDER — GUAIFENESIN-DM 100-10 MG/5ML PO SYRP: 5.0000 mL | ORAL_SOLUTION | ORAL | 0 refills | Status: DC | PRN

## 2020-12-28 MED ORDER — GUAIFENESIN-DM 100-10 MG/5ML PO SYRP
5.0000 mL | ORAL_SOLUTION | ORAL | Status: DC | PRN
  Administered 2020-12-28: 5 mL via ORAL
  Filled 2020-12-28: qty 5

## 2020-12-28 MED ORDER — PREDNISONE 10 MG PO TABS: ORAL_TABLET | ORAL | 0 refills | Status: DC

## 2020-12-28 NOTE — Discharge Summary (Signed)
Physician Discharge Summary  [name] PXT:[id number] DOB: [birth year] DOA: 12/25/2020  PCP: Asencion Noble, MD  Admit date: 12/25/2020 Discharge date: 12/28/2020  Time spent: 35 minutes  Recommendations for Outpatient Follow-up:  1. Continue close monitoring of patient's CBGs with initiation of hypoglycemic regimen as required. 2. Follow-up with pulmonologist for further adjustment to maintenance management of underlying asthma condition.   3. Final patient's blood pressure with adjustment of antihypertensive regimen as needed.   Discharge Diagnoses:  Active Problems:   GERD (gastroesophageal reflux disease)   Asthma exacerbation   Essential hypertension Depression/anxiety. Prediabetes/hyperglycemia/impaired Glucose intolerance.  Discharge Condition: Stable and improved.  Patient discharged home with instruction to follow-up PCP in 3 weeks and to follow-up with pulmonology service in about 10 days.  Diet recommendation: Heart healthy diet modified carbohydrate.  Filed Weights   12/25/20 1328 12/25/20 2200  Weight: 77.1 kg 78.9 kg    History of present illness:   65 year old female with a history of asthma, hypertension, fibromyalgia, retinal artery occlusion presenting with 4-day history of progressive shortness of breath and increasing coughing. She stated that she began having symptoms on 5/2/22with some coughing and mild shortness of breath. Her symptoms continue to progress despite using her nebulizer treatments and Symbicort at home. She denies any recent travels or sick contacts. She contacted her PCP on 12/23/2020. She was given a prescription for prednisone which she began taking with no significant improvement. She denies any fevers, chills, hemoptysis, nausea, vomiting, diarrhea, abdominal pain dysuria, hematuria. She quit smoking in 1976 after only smoking briefly. She has not been exposed to secondhand smoke. In the emergency department, the patient was afebrile  hemodynamically stable. She was tachycardic into 110s.She was given an hour-long nebulizer treatment with albuterol with some improvement. However after the patient was ambulated she had increasing shortness of breath and tachycardia and tachypnea. As result, the patient was admitted for further evaluation. BMP was unremarkable with sodium 141, potassium 3.9, serum creatinine 0.90. WBC 13.8, hemoglobin 11.8, platelets 328.Chest x-ray was negative for any infiltrates.  Hospital Course:  Acute asthma exacerbation -The patient remains intermittently short of breath with mild exertion; able to speak in full sentences and having good oxygen saturation on room air. -Appreciate pulmonary consultation and recommendations. -Discharge with a slow prednisone tapering over the next 2 weeks. -Resume as needed rescue DuoNeb; continue Symbicort. -Outpatient follow-up with pulmonology service maintenance treatment for her asthma. -Personally reviewed chest x-ray--no infiltrates appreciated. -Viral respiratory panel--pending at time of discharge. -Add antitussive to be used as needed.  Essential hypertension -Continue amlodipine -Low-sodium diet has been encouraged.  Chronic daily headache -Continue Topamax  Hyperglycemia/Impaired glucose tolerance/prediabetes -Driven by his steroids usage -Check hemoglobin A1c--5.8 -Continue close monitoring of patient's CBGs -I asked her to follow modified carbohydrate diet.  GERD -Continue PPI  Anxiety/depression -Continue home dose alprazolam   Procedures:  See below for x-ray reports.  Consultations:  Pulmonology service  Discharge Exam: Vitals:   12/28/20 0745 12/28/20 0757  BP: 140/81   Pulse: 80   Resp: 18   Temp: 98 F (36.7 C)   SpO2: 95% 99%    General: Afebrile, no chest pain, no nausea, no vomiting.  Speaking in full sentences and expressing improvement in her breathing.  Still slightly short of breath with activity but  no requiring oxygen supplementation and feeling ready to go home. Cardiovascular: S1 and S2, no rubs, no gallops, no JVD. Respiratory: Improved air movement bilaterally; rate is slightly expiratory wheezing appreciated; No using accessory  muscles.  No tachypnea. Abdomen: Soft, nontender, distended, positive bowel sounds Extremities: No cyanosis, no clubbing, no edema.  Discharge Instructions   Discharge Instructions    Diet - low sodium heart healthy   Complete by: As directed    Discharge instructions   Complete by: As directed    Maintain adequate hydration Take medications as prescribed Follow-up With pulmonologist in about 2 weeks Arrange follow-up with your PCP in approximately 3 weeks after discharge. Resume inhalers/nebulizer management of previously prescribed.     Allergies as of 12/28/2020   No Known Allergies     Medication List    TAKE these medications   Alka-Seltzer Heartburn + Gas 750-80 MG Chew Generic drug: Calcium Carbonate-Simethicone Chew 2 tablets by mouth 2 (two) times daily. Chews 2 a day.   ALPRAZolam 1 MG tablet Commonly known as: XANAX Take 1 mg by mouth at bedtime as needed for anxiety.   amLODipine 5 MG tablet Commonly known as: NORVASC Take 5 mg by mouth daily.   benzonatate 200 MG capsule Commonly known as: TESSALON Take 200 mg by mouth 3 (three) times daily as needed.   budesonide-formoterol 160-4.5 MCG/ACT inhaler Commonly known as: SYMBICORT Inhale 2 puffs into the lungs 2 (two) times daily.   diphenhydrAMINE 25 mg capsule Commonly known as: BENADRYL Take 25 mg by mouth as needed for itching or allergies.   EPINEPHrine 0.3 mg/0.3 mL Soaj injection Commonly known as: EPI-PEN Inject 0.3 mg into the muscle as needed for anaphylaxis.   Guaifenesin 1200 MG Tb12 Take 2,400 mg by mouth 2 (two) times daily.   guaiFENesin-dextromethorphan 100-10 MG/5ML syrup Commonly known as: ROBITUSSIN DM Take 5 mLs by mouth every 4 (four) hours as  needed for cough.   ipratropium-albuterol 0.5-2.5 (3) MG/3ML Soln Commonly known as: DUONEB Inhale 3 mLs into the lungs every 4 (four) hours as needed (shortness of breath).   lubiprostone 24 MCG capsule Commonly known as: AMITIZA Take 24 mcg by mouth 2 (two) times daily.   methocarbamol 750 MG tablet Commonly known as: ROBAXIN Take 750 mg by mouth at bedtime.   multivitamin-prenatal 27-0.8 MG Tabs tablet Take 1 tablet by mouth daily at 12 noon.   pantoprazole 40 MG tablet Commonly known as: PROTONIX TAKE ONE TABLET (40MG  TOTAL) BY MOUTH TWO TIMES DAILY BEFORE A MEAL What changed: See the new instructions.   predniSONE 10 MG tablet Commonly known as: DELTASONE Take 6 tablets by mouth daily x3 days; then start decreasing by 10 on daily basis every 2 days until tapering completed. What changed:   how much to take  how to take this  when to take this  additional instructions   ProAir HFA 108 (90 Base) MCG/ACT inhaler Generic drug: albuterol Inhale 2 puffs into the lungs as needed for wheezing or shortness of breath.   Sodium Bicarbonate Powd by Does not apply route. Takes 1/2 teaspoon full twice daily.   topiramate 200 MG tablet Commonly known as: TOPAMAX Take 200 mg by mouth 2 (two) times daily.   VITAMIN D PO Take 1 tablet by mouth daily.      No Known Allergies  Follow-up Information    Asencion Noble, MD. Schedule an appointment as soon as possible for a visit in 3 week(s).   Specialty: Internal Medicine Contact information: [address] [place] [address] [phone]        Rigoberto Noel, MD. Schedule an appointment as soon as possible for a visit in 2 week(s).  Specialty: Pulmonary Disease Why: [place] office Contact information: [place] [address] [phone]               The results of significant diagnostics from this hospitalization (including imaging, microbiology, ancillary and  laboratory) are listed below for reference.    Significant Diagnostic Studies: DG Chest Port 1 View  Result Date: 12/25/2020 CLINICAL DATA:  Cough, wheezing and shortness of breath for the past 3 days. EXAM: PORTABLE CHEST 1 VIEW COMPARISON:  None. FINDINGS: The heart size and mediastinal contours are within normal limits. Both lungs are clear. The visualized skeletal structures are unremarkable. IMPRESSION: No active disease. Electronically Signed   By: Claudie Revering M.D.   On: 12/25/2020 14:32    Microbiology: Recent Results (from the past 240 hour(s))  Resp Panel by RT-PCR (Flu A&B, Covid) Nasopharyngeal Swab     Status: None   Collection Time: 12/25/20  2:25 PM   Specimen: Nasopharyngeal Swab; Nasopharyngeal(NP) swabs in vial transport medium  Result Value Ref Range Status   SARS Coronavirus 2 by RT PCR NEGATIVE NEGATIVE Final    Comment: (NOTE) SARS-CoV-2 target nucleic acids are NOT DETECTED.  The SARS-CoV-2 RNA is generally detectable in upper respiratory specimens during the acute phase of infection. The lowest concentration of SARS-CoV-2 viral copies this assay can detect is 138 copies/mL. A negative result does not preclude SARS-Cov-2 infection and should not be used as the sole basis for treatment or other patient management decisions. A negative result may occur with  improper specimen collection/handling, submission of specimen other than nasopharyngeal swab, presence of viral mutation(s) within the areas targeted by this assay, and inadequate number of viral copies(<138 copies/mL). A negative result must be combined with clinical observations, patient history, and epidemiological information. The expected result is Negative.  Fact Sheet for Patients:  EntrepreneurPulse.com.au  Fact Sheet for Healthcare Providers:  IncredibleEmployment.be  This test is no t yet approved or cleared by the [place] FDA and  has been authorized for  detection and/or diagnosis of SARS-CoV-2 by FDA under an Emergency Use Authorization (EUA). This EUA will remain  in effect (meaning this test can be used) for the duration of the COVID-19 declaration under Section 564(b)(1) of the Act, 21 U.S.C.section 360bbb-3(b)(1), unless the authorization is terminated  or revoked sooner.       Influenza A by PCR NEGATIVE NEGATIVE Final   Influenza B by PCR NEGATIVE NEGATIVE Final    Comment: (NOTE) The Xpert Xpress SARS-CoV-2/FLU/RSV plus assay is intended as an aid in the diagnosis of influenza from Nasopharyngeal swab specimens and should not be used as a sole basis for treatment. Nasal washings and aspirates are unacceptable for Xpert Xpress SARS-CoV-2/FLU/RSV testing.  Fact Sheet for Patients: EntrepreneurPulse.com.au  Fact Sheet for Healthcare Providers: IncredibleEmployment.be  This test is not yet approved or cleared by the [place] FDA and has been authorized for detection and/or diagnosis of SARS-CoV-2 by FDA under an Emergency Use Authorization (EUA). This EUA will remain in effect (meaning this test can be used) for the duration of the COVID-19 declaration under Section 564(b)(1) of the Act, 21 U.S.C. section 360bbb-3(b)(1), unless the authorization is terminated or revoked.  Performed at [facility], [address], [place], [place] [address]   Respiratory (~20 pathogens) panel by PCR     Status: Abnormal   Collection Time: 12/26/20  9:00 AM   Specimen: Nasopharyngeal Swab; Respiratory  Result Value Ref Range Status   Adenovirus NOT  DETECTED NOT DETECTED Final   Coronavirus 229E NOT DETECTED NOT DETECTED Final    Comment: (NOTE) The Coronavirus on the Respiratory Panel, DOES NOT test for the novel  Coronavirus (2019 nCoV)    Coronavirus HKU1 NOT DETECTED NOT DETECTED Final   Coronavirus NL63 NOT DETECTED NOT DETECTED Final   Coronavirus OC43 DETECTED (A) NOT DETECTED Final    Metapneumovirus NOT DETECTED NOT DETECTED Final   Rhinovirus / Enterovirus NOT DETECTED NOT DETECTED Final   Influenza A NOT DETECTED NOT DETECTED Final   Influenza B NOT DETECTED NOT DETECTED Final   Parainfluenza Virus 1 NOT DETECTED NOT DETECTED Final   Parainfluenza Virus 2 NOT DETECTED NOT DETECTED Final   Parainfluenza Virus 3 NOT DETECTED NOT DETECTED Final   Parainfluenza Virus 4 NOT DETECTED NOT DETECTED Final   Respiratory Syncytial Virus NOT DETECTED NOT DETECTED Final   Bordetella pertussis NOT DETECTED NOT DETECTED Final   Bordetella Parapertussis NOT DETECTED NOT DETECTED Final   Chlamydophila pneumoniae NOT DETECTED NOT DETECTED Final   Mycoplasma pneumoniae NOT DETECTED NOT DETECTED Final    Comment: Performed at [facility], [place] [address] [place], [place] [address]     Labs: Basic Metabolic Panel: Recent Labs  Lab 12/25/20 1422 12/26/20 0432  NA 139 141  K 4.1 3.9  CL 110 115*  CO2 22 19*  GLUCOSE 236* 128*  BUN 16 15  CREATININE 0.99 0.90  CALCIUM 8.7* 9.0   CBC: Recent Labs  Lab 12/25/20 1422 12/26/20 0432  WBC 13.8* 16.2*  NEUTROABS 12.7*  --   HGB 11.7* 11.8*  HCT 37.6 37.5  MCV 94.0 93.5  PLT 318 326    Signed:  Barton Dubois MD.  [facility] 12/28/2020, 2:06 PM

## 2020-12-28 NOTE — Progress Notes (Addendum)
Name: [name] MRN: [id number] DOB: 06-07-1956    ADMISSION DATE:  12/25/2020 CONSULTATION DATE:  12/28/2020   REFERRING MD :  Carles Collet TRH  CHIEF COMPLAINT: Asthma exacerbation  BRIEF PATIENT DESCRIPTION: 65 year old never smoker with slow to resolve asthma exacerbation  SIGNIFICANT EVENTS    STUDIES:  Esophageal manometry/pH impedance study 10/2020 normal Spirometry 08/2017 no airway obstruction  HISTORY OF PRESENT ILLNESS: 65 year old woman with a history of asthma for 40 years.  She previously was a patient of Dr. Luan Atkinson.  She has been maintained on regimen of Symbicort twice daily which she takes religiously.  Her triggers include weather changes, infections.  Her last exacerbation was 3 years ago requiring prednisone.  She smoked less than 5 pack years and quit in 1976.  She had previous allergy evaluation and was only noted to be allergic to cats, she has always had 2 cats at home. Her current issues started 2 weeks ago with a URI type illness, she took over-the-counter medications and this seemed to improve somewhat and then 6 days ago wheezing and coughing got worse she contacted her PCP Dr. Willey Blade got a prescription of prednisone but required hospital admission on 5/8.  She needed an hour-long nebulizer treatment in the ED, chest x-ray did not show any infiltrates, COVID testing was negative. She was treated with IV Solu-Medrol and duo nebs. On 5/9 Pulmicort was added and respiratory viral panel was ordered   About 3 days ago she developed acute left-sided chest pain and this has persisted and she has to splint her chest when coughing   SUBJECTIVE: Breathing unchanged Did not started on much last 24 hours. Continues to have bouts of coughing with chest pain  VITAL SIGNS: Temp:  [98 F (36.7 C)-98.7 F (37.1 C)] 98 F (36.7 C) (05/11 0745) Pulse Rate:  [80-94] 80 (05/11 0745) Resp:  [16-20] 18 (05/11 0745) BP: (129-142)/(65-86) 140/81 (05/11 0745) SpO2:  [95 %-99  %] 99 % (05/11 0757)  PHYSICAL EXAMINATION: Gen. Pleasant, well-nourished, in no distress, normal affect ENT - no pallor,icterus, no post nasal drip Neck: No JVD, no thyromegaly, no carotid bruits Lungs: Decreased rhonchi, no accessory muscle use, barking cough Cardiovascular: S1-S2 regular, no murmur, no edema Abdomen: soft and non-tender, no hepatosplenomegaly, BS normal. Musculoskeletal: No deformities, no cyanosis or clubbing Neuro:  alert, non focal   Recent Labs  Lab 12/25/20 1422 12/26/20 0432  NA 139 141  K 4.1 3.9  CL 110 115*  CO2 22 19*  BUN 16 15  CREATININE 0.99 0.90  GLUCOSE 236* 128*   Recent Labs  Lab 12/25/20 1422 12/26/20 0432  HGB 11.7* 11.8*  HCT 37.6 37.5  WBC 13.8* 16.2*  PLT 318 326   No results found.  ASSESSMENT / PLAN:  Slow to resolve asthma exacerbation. Likely triggered by viral bronchitis   05/2020 AEC 300   Plan -Solu-Medrol can be tapered and she can eventually be discharged on prednisone taper for 2 weeks -She can resume her home regimen of Symbicort on discharge  Continue albuterol nebs every 4 hours as needed -Follow-up respiratory viral panel for metapneumovirus infection which can have a prolonged course like this Pulmonary office appointment in 2 weeks   Musculoskeletal chest pain -likely from rib strain due to coughing, no evidence of rib fracture  -Incentive spirometry to prevent splinting   She has MRIs of her left hip and lumbar spine scheduled for today, defer to hospitalist service  [name] MD. [facility].  Pulmonary &  Critical care Pager : [phone]  If no response to pager , please call [phone] until 7 pm After 7:00 pm call Elink  [phone]     12/28/2020, 8:39 AM

## 2020-12-30 DIAGNOSIS — M25552 Pain in left hip: Secondary | ICD-10-CM | POA: Diagnosis not present

## 2021-01-09 ENCOUNTER — Ambulatory Visit (INDEPENDENT_AMBULATORY_CARE_PROVIDER_SITE_OTHER): Payer: PPO | Admitting: Pulmonary Disease

## 2021-01-09 ENCOUNTER — Other Ambulatory Visit: Payer: Self-pay

## 2021-01-09 ENCOUNTER — Encounter: Payer: Self-pay | Admitting: Pulmonary Disease

## 2021-01-09 DIAGNOSIS — J3089 Other allergic rhinitis: Secondary | ICD-10-CM | POA: Diagnosis not present

## 2021-01-09 DIAGNOSIS — J4531 Mild persistent asthma with (acute) exacerbation: Secondary | ICD-10-CM | POA: Diagnosis not present

## 2021-01-09 NOTE — Assessment & Plan Note (Signed)
Check RAST panel and based on this we can consider adding Singulair if needed

## 2021-01-09 NOTE — Assessment & Plan Note (Addendum)
Her current exacerbation is resolving.  This seems to have been due to a viral bronchitis, respiratory viral panel was positive for coronavirus, not COVID. She is gradually improved and I will have her taper prednisone to off.  For her it would be really important to avoid unnecessary steroids given side effects of avascular necrosis. I emphasized that she needs to take Symbicort twice daily.  We discussed a plan for asthma exacerbation in the future. She can discontinue Mucinex and continue Benadryl which seems to improve her nighttime cough.  If she has more exacerbations, we can consider a biologic but her last exacerbation was 3 years ago so I am optimistic that we should be able to control her with Symbicort

## 2021-01-09 NOTE — Progress Notes (Signed)
   Subjective:    Patient ID: [name], female    DOB: Jan 05, 1956, 65 y.o.   MRN: [id number]  HPI   65 year old woman with a history of asthma for 40 years.  She previously was a patient of Dr. Luan Pulling.  She has been maintained on regimen of Symbicort twice daily which she takes religiously.  Her triggers include weather changes, infections.  Her last exacerbation was 3 years ago requiring prednisone.  She smoked less than 5 pack years and quit in 1976.  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 Follow-up    Hospital follow up-constant non productive cough that wakes patient up at night   She is much improved, breathing is improved, she still needs to take an occasional albuterol when she goes out for a walk.  She has a nighttime cough, this gets better if she takes Benadryl.  Tessalon does not seem to work. She has tapered the prednisone down to 20 mg and still has a few pills left.  She She takes Mucinex every day for the last 3 years and wonders if she still needs this    Significant tests/ events reviewed Esophageal manometry/pH impedance study 10/2020 normal Spirometry 08/2017 no airway obstruction  05/2020 AEC 300   Review of Systems neg for any significant sore throat, dysphagia, itching, sneezing, nasal congestion or excess/ purulent secretions, fever, chills, sweats, unintended wt loss, pleuritic or exertional cp, hempoptysis, orthopnea pnd or change in chronic leg swelling. Also denies presyncope, palpitations, heartburn, abdominal pain, nausea, vomiting, diarrhea or change in bowel or urinary habits, dysuria,hematuria, rash,  arthralgias, visual complaints, headache, numbness weakness or ataxia.     Objective:   Physical Exam  Gen. Pleasant, well-nourished, in no distress ENT - no thrush, no pallor/icterus,no post nasal drip Neck: No JVD, no thyromegaly, no carotid bruits Lungs: no use of accessory muscles, no dullness to percussion, clear without rales or rhonchi  Cardiovascular: Rhythm regular, heart sounds  normal, no murmurs or gallops, no peripheral edema Musculoskeletal: No deformities, no cyanosis or clubbing        Assessment & Plan:

## 2021-01-09 NOTE — Patient Instructions (Signed)
Decrease prednisone to 10 mg daily x 3 days then STOP Symbicort 2 puff twice daily OK to stop mucinex OK to take benadryl for night time cough   RAST testing next week

## 2021-01-17 DIAGNOSIS — I10 Essential (primary) hypertension: Secondary | ICD-10-CM | POA: Diagnosis not present

## 2021-01-17 DIAGNOSIS — E785 Hyperlipidemia, unspecified: Secondary | ICD-10-CM | POA: Diagnosis not present

## 2021-01-20 ENCOUNTER — Other Ambulatory Visit (HOSPITAL_COMMUNITY)
Admission: RE | Admit: 2021-01-20 | Discharge: 2021-01-20 | Disposition: A | Discharge status: Home / Self Care | Payer: PPO | Source: Ambulatory Visit | Attending: Pulmonary Disease | Admitting: Pulmonary Disease

## 2021-01-20 ENCOUNTER — Other Ambulatory Visit: Payer: Self-pay

## 2021-01-20 DIAGNOSIS — J3089 Other allergic rhinitis: Secondary | ICD-10-CM | POA: Insufficient documentation

## 2021-01-20 DIAGNOSIS — J4531 Mild persistent asthma with (acute) exacerbation: Secondary | ICD-10-CM | POA: Insufficient documentation

## 2021-01-26 LAB — ALLERGENS W/TOTAL IGE AREA 2
Alternaria Alternata IgE: <0.1 kU/L
Aspergillus Fumigatus IgE: <0.1 kU/L
Bermuda Grass IgE: 0.11 kU/L — AB
Cat Dander IgE: 7.7 kU/L — AB
Cedar, Mountain IgE: <0.1 kU/L
Cladosporium Herbarum IgE: <0.1 kU/L
Cockroach, German IgE: <0.1 kU/L
Common Silver Birch IgE: <0.1 kU/L
Cottonwood IgE: <0.1 kU/L
D Farinae IgE: <0.1 kU/L
D Pteronyssinus IgE: <0.1 kU/L
Dog Dander IgE: 0.97 kU/L — AB
Elm, American IgE: <0.1 kU/L
IgE (Immunoglobulin E), Serum: 32 IU/mL (ref 6–495)
Johnson Grass IgE: <0.1 kU/L
Maple/Box Elder IgE: <0.1 kU/L
Mouse Urine IgE: <0.1 kU/L
Oak, White IgE: <0.1 kU/L
Pecan, Hickory IgE: <0.1 kU/L
Penicillium Chrysogen IgE: <0.1 kU/L
Pigweed, Rough IgE: <0.1 kU/L
Ragweed, Short IgE: <0.1 kU/L
Sheep Sorrel IgE Qn: <0.1 kU/L
Timothy Grass IgE: 1.05 kU/L — AB
White Mulberry IgE: <0.1 kU/L

## 2021-02-01 NOTE — Progress Notes (Signed)
Called and went over lab results per Dr Elsworth Soho with patient. All questions answered and patient expressed full understanding. Nothing further needed at this time.

## 2021-02-06 DIAGNOSIS — M21612 Bunion of left foot: Secondary | ICD-10-CM | POA: Diagnosis not present

## 2021-02-06 DIAGNOSIS — T8484XA Pain due to internal orthopedic prosthetic devices, implants and grafts, initial encounter: Secondary | ICD-10-CM | POA: Diagnosis not present

## 2021-02-08 ENCOUNTER — Other Ambulatory Visit: Payer: Self-pay | Admitting: Orthopedic Surgery

## 2021-02-08 DIAGNOSIS — T8484XA Pain due to internal orthopedic prosthetic devices, implants and grafts, initial encounter: Secondary | ICD-10-CM

## 2021-02-17 DIAGNOSIS — M25561 Pain in right knee: Secondary | ICD-10-CM | POA: Diagnosis not present

## 2021-02-21 DIAGNOSIS — H6982 Other specified disorders of Eustachian tube, left ear: Secondary | ICD-10-CM | POA: Diagnosis not present

## 2021-03-04 ENCOUNTER — Other Ambulatory Visit: Payer: Self-pay

## 2021-03-04 ENCOUNTER — Ambulatory Visit
Admission: RE | Admit: 2021-03-04 | Discharge: 2021-03-04 | Disposition: A | Discharge status: Home / Self Care | Payer: PPO | Source: Ambulatory Visit | Attending: Orthopedic Surgery | Admitting: Orthopedic Surgery

## 2021-03-04 DIAGNOSIS — Z9889 Other specified postprocedural states: Secondary | ICD-10-CM | POA: Diagnosis not present

## 2021-03-04 DIAGNOSIS — M19072 Primary osteoarthritis, left ankle and foot: Secondary | ICD-10-CM | POA: Diagnosis not present

## 2021-03-04 DIAGNOSIS — T8484XA Pain due to internal orthopedic prosthetic devices, implants and grafts, initial encounter: Secondary | ICD-10-CM

## 2021-03-04 DIAGNOSIS — Z981 Arthrodesis status: Secondary | ICD-10-CM | POA: Diagnosis not present

## 2021-04-03 ENCOUNTER — Other Ambulatory Visit: Payer: Self-pay | Admitting: Gastroenterology

## 2021-05-18 ENCOUNTER — Other Ambulatory Visit (HOSPITAL_COMMUNITY): Payer: Self-pay | Admitting: Internal Medicine

## 2021-05-18 ENCOUNTER — Other Ambulatory Visit (HOSPITAL_COMMUNITY): Payer: Self-pay | Admitting: Adult Health

## 2021-05-18 DIAGNOSIS — Z1231 Encounter for screening mammogram for malignant neoplasm of breast: Secondary | ICD-10-CM

## 2021-06-05 ENCOUNTER — Encounter: Payer: Self-pay | Admitting: Pulmonary Disease

## 2021-06-05 ENCOUNTER — Ambulatory Visit: Payer: PPO | Admitting: Pulmonary Disease

## 2021-06-05 ENCOUNTER — Other Ambulatory Visit: Payer: Self-pay

## 2021-06-05 ENCOUNTER — Ambulatory Visit (HOSPITAL_COMMUNITY)
Admission: RE | Admit: 2021-06-05 | Discharge: 2021-06-05 | Disposition: A | Discharge status: Home / Self Care | Payer: PPO | Source: Ambulatory Visit | Attending: Adult Health | Admitting: Adult Health

## 2021-06-05 DIAGNOSIS — Z1231 Encounter for screening mammogram for malignant neoplasm of breast: Secondary | ICD-10-CM | POA: Diagnosis not present

## 2021-06-05 DIAGNOSIS — J4531 Mild persistent asthma with (acute) exacerbation: Secondary | ICD-10-CM | POA: Diagnosis not present

## 2021-06-05 DIAGNOSIS — J3089 Other allergic rhinitis: Secondary | ICD-10-CM | POA: Diagnosis not present

## 2021-06-05 MED ORDER — SODIUM CHLORIDE 3 % IN NEBU
INHALATION_SOLUTION | Freq: Every day | RESPIRATORY_TRACT | 12 refills | Status: AC
Start: 2021-06-05 — End: 2021-07-05

## 2021-06-05 MED ORDER — MONTELUKAST SODIUM 10 MG PO TABS: 10.0000 mg | ORAL_TABLET | Freq: Every day | ORAL | 11 refills | Status: DC

## 2021-06-05 MED ORDER — AMOXICILLIN-POT CLAVULANATE 875-125 MG PO TABS: 1.0000 | ORAL_TABLET | Freq: Two times a day (BID) | ORAL | 0 refills | Status: AC

## 2021-06-05 NOTE — Progress Notes (Signed)
   Subjective:    Patient ID: [name], female    DOB: 1956-08-03, 65 y.o.   MRN: [id number]  HPI   65 year old woman with a history of asthma for 40 years.  She previously was a patient of Dr. Luan Pulling.  She has been maintained on regimen of Symbicort twice daily which she takes religiously.  Her triggers include weather changes, infections.  Her last exacerbation was 3 years ago requiring prednisone.  She smoked less than 5 pack years and quit in 1976.  She had previous allergy evaluation and was only noted to be allergic to cats, she has always had 2 cats at home She was admitted 5/8-5/11 for slow to resolve asthma exacerbation and treated with steroids, bronchodilators Respiratory viral panel was positive for a coronavirus (not covid 19)   PMH -avascular necrosis of both hips s/p RT THR Last flare 12/2020 -viral bronchitis  Chief Complaint  Patient presents with   Follow-up    Cough persistent with some mucus unsure of color. SOB with weather change.    Last office visit reviewed.  We reviewed RAST panel. She reports increased cough and wheezing especially during spring and fall.  She has amoxicillin given by her dentist and she has already started using this, she has also started using prednisone that she had leftover. She still has a cough and asked for something that can "break this up" She is compliant with Symbicort Denies persistent symptoms in between her flareups  Significant tests/ events reviewed  RAST 01/2021 IgE 32, cat, dog dander, grass Esophageal manometry/pH impedance study 10/2020 normal Spirometry 08/2017 no airway obstruction   05/2020 AEC 300   Review of Systems neg for any significant sore throat, dysphagia, itching, sneezing, nasal congestion or excess/ purulent secretions, fever, chills, sweats, unintended wt loss, pleuritic or exertional cp, hempoptysis, orthopnea pnd or change in chronic leg swelling. Also denies presyncope, palpitations, heartburn,  abdominal pain, nausea, vomiting, diarrhea or change in bowel or urinary habits, dysuria,hematuria, rash, arthralgias, visual complaints, headache, numbness weakness or ataxia.     Objective:   Physical Exam  Gen. Pleasant, well-nourished, in no distress ENT - no thrush, no pallor/icterus,no post nasal drip Neck: No JVD, no thyromegaly, no carotid bruits Lungs: no use of accessory muscles, no dullness to percussion, clear without rales or rhonchi  Cardiovascular: Rhythm regular, heart sounds  normal, no murmurs or gallops, no peripheral edema Musculoskeletal: No deformities, no cyanosis or clubbing        Assessment & Plan:

## 2021-06-05 NOTE — Assessment & Plan Note (Addendum)
She has persistent asthma with recurrent flareups which is due to allergies. We discussed strategy of using Singulair and Zyrtec especially during spring and fall season. We discussed allergy avoidance given her RAST panel. Will also treat her for bronchitis and give her prescription for Augmentin because she feels that this works best for her We also talked about strategies to clear sputum including Mucinex and hypertonic saline nebs.  We will provide her prescription for this  Daughter strategy here remains to minimize prednisone use only during a flare given her history of avascular necrosis and try to maximize all other therapies

## 2021-06-05 NOTE — Assessment & Plan Note (Signed)
Singulair and Zyrtec to take during spring and fall

## 2021-06-05 NOTE — Addendum Note (Signed)
Addended by: Fritzi Mandes D on: 06/05/2021 10:32 AM   Modules accepted: Orders

## 2021-06-05 NOTE — Patient Instructions (Addendum)
  Mucinex twice daily Hypertonic saline neb #30  take once daily Continue on symbicort 2 puffs twice daily  Rx augmentin 875 bid x 7 days   Take zyrtec 10 &  Rx for singulair 10 mg mg at bedtime during spring & fall

## 2021-06-05 NOTE — Addendum Note (Signed)
Addended by: Fritzi Mandes D on: 06/05/2021 12:25 PM   Modules accepted: Orders

## 2021-06-26 ENCOUNTER — Ambulatory Visit (INDEPENDENT_AMBULATORY_CARE_PROVIDER_SITE_OTHER): Payer: PPO | Admitting: Adult Health

## 2021-06-26 ENCOUNTER — Other Ambulatory Visit: Payer: Self-pay

## 2021-06-26 ENCOUNTER — Encounter: Payer: Self-pay | Admitting: Adult Health

## 2021-06-26 VITALS — BP 144/100 | HR 106 | Ht 67.0 in | Wt 171.5 lb

## 2021-06-26 DIAGNOSIS — R4589 Other symptoms and signs involving emotional state: Secondary | ICD-10-CM | POA: Insufficient documentation

## 2021-06-26 DIAGNOSIS — R52 Pain, unspecified: Secondary | ICD-10-CM | POA: Diagnosis not present

## 2021-06-26 NOTE — Progress Notes (Signed)
  Subjective:     Patient ID: [name], female   DOB: Oct 26, 1955, 65 y.o.   MRN: [id number]  HPI [name] is a 65 year year old white female, married,but do not sleep together, PM in to discuss meds. She brought all her meds with her and talked about body ached, fibromyalgia and emotions. And her sugar cravings. She has had 6 deaths in her family in last several years and numerous surgeries and normally does well, but recently more achy and teary and called pharmacy and discovered she was not talking paxil, has started back and fells better but only takes 10 mg. She had used phentermine in the past,prescribed by Dr Glo Herring and felt better when lost some weight and stopped her sugar cravings, she still has 11 of them from 2021. She says she itches esp at night and talks benadryl and xanax and that helps. PCP is Dr Willey Blade.   Review of Systems +body aches +teary at times +itches +sugar cravings Reviewed past medical,surgical, social and family history. Reviewed medications and allergies.     Objective:   Physical Exam BP (!) 144/100 (BP Location: Left Arm, Patient Position: Sitting, Cuff Size: Normal)   Pulse (!) 106   Ht 5\' 7"  (1.702 m)   Wt 171 lb 8 oz (77.8 kg)   LMP 12/13/2015   BMI 26.86 kg/m    Skin warm and dry.Lungs: clear to ausculation bilaterally. Cardiovascular: regular rate and rhythm..   Fall risk is low  Upstream - 06/26/21 0927       Pregnancy Intention Screening   Does the patient want to become pregnant in the next year? No    Does the patient's partner want to become pregnant in the next year? No    Would the patient like to discuss contraceptive options today? No      Contraception Wrap Up   Current Method Female Sterilization    End Method Female Sterilization    Contraception Counseling Provided No             Assessment:     1. Body aches   2. Emotional state symptom,better since on Paxil again     Plan:     Has physical and labs with Dr  Willey Blade Return in 4 weeks for pap

## 2021-07-27 ENCOUNTER — Encounter: Payer: Self-pay | Admitting: Adult Health

## 2021-07-27 ENCOUNTER — Other Ambulatory Visit: Payer: Self-pay

## 2021-07-27 ENCOUNTER — Ambulatory Visit (INDEPENDENT_AMBULATORY_CARE_PROVIDER_SITE_OTHER): Payer: PPO | Admitting: Adult Health

## 2021-07-27 ENCOUNTER — Other Ambulatory Visit (HOSPITAL_COMMUNITY)
Admission: RE | Admit: 2021-07-27 | Discharge: 2021-07-27 | Disposition: A | Discharge status: Home / Self Care | Payer: PPO | Source: Ambulatory Visit | Attending: Adult Health | Admitting: Adult Health

## 2021-07-27 VITALS — BP 145/98 | HR 86 | Ht 67.0 in | Wt 177.0 lb

## 2021-07-27 DIAGNOSIS — Z01419 Encounter for gynecological examination (general) (routine) without abnormal findings: Secondary | ICD-10-CM

## 2021-07-27 DIAGNOSIS — Z1151 Encounter for screening for human papillomavirus (HPV): Secondary | ICD-10-CM | POA: Diagnosis not present

## 2021-07-27 DIAGNOSIS — I10 Essential (primary) hypertension: Secondary | ICD-10-CM | POA: Diagnosis not present

## 2021-07-27 DIAGNOSIS — Z1211 Encounter for screening for malignant neoplasm of colon: Secondary | ICD-10-CM | POA: Diagnosis not present

## 2021-07-27 LAB — HEMOCCULT GUIAC POC 1CARD (OFFICE): Fecal Occult Blood, POC: NEGATIVE

## 2021-07-27 NOTE — Progress Notes (Signed)
Patient ID: [name], female   DOB: 08-03-1956, 65 y.o.   MRN: [id number] History of Present Illness: [name] is a 65 year old white female, married, PM in for well woman gyn exam and pap. She had tick bite on chin recently, was on less than 24 hours. Feels better now, but had red bull drink this morning and BP elevated. She stays busy with grandchildren.  PCP is Dr Willey Blade.    Current Medications, Allergies, Past Medical History, Past Surgical History, Family History and Social History were reviewed in [employer] record.     Review of Systems:  Patient denies any headaches, hearing loss, fatigue, blurred vision, shortness of breath, chest pain, abdominal pain, problems with bowel movements, urination, or intercourse. (Not having sex).No joint pain or mood swings.    Physical Exam:BP (!) 145/98 (BP Location: Left Arm, Patient Position: Sitting, Cuff Size: Normal)   Pulse 86   Ht 5\' 7"  (1.702 m)   Wt 177 lb (80.3 kg)   LMP 12/13/2015   BMI 27.72 kg/m   General:  Well developed, well nourished, no acute distress Skin:  Warm and dry Neck:  Midline trachea, normal thyroid, good ROM, no lymphadenopathy,no carotid bruits heard , red spot on chin from tick bite. Lungs; Clear to auscultation bilaterally Breast:  No dominant palpable mass, retraction, or nipple discharge Cardiovascular: Regular rate and rhythm Abdomen:  Soft, non tender, no hepatosplenomegaly Pelvic:  External genitalia is normal in appearance, no lesions.  The vagina is pale with loss of moisture  and rugae. Urethra has no lesions or masses. The cervix is smooth, pap with HR HPV genotyping performed. Uterus is felt to be normal size, shape, and contour.  No adnexal masses or tenderness noted.Bladder is non tender, no masses felt. Rectal: Good sphincter tone, no polyps, or hemorrhoids felt.  Hemoccult negative. Extremities/musculoskeletal:  No swelling or varicosities noted, no clubbing or  cyanosis Psych:  No mood changes, alert and cooperative,seems happy AA is 0  Fall risk is low Depression screen [facility] 2/9 07/27/2021 07/27/2021 07/26/2020  Decreased Interest 0 0 0  Down, Depressed, Hopeless 0 0 0  PHQ - 2 Score 0 0 0  Altered sleeping 0 0 -  Tired, decreased energy 0 0 -  Change in appetite 0 0 -  Feeling bad or failure about yourself  0 0 -  Trouble concentrating 0 0 -  Moving slowly or fidgety/restless 0 0 -  Suicidal thoughts 0 0 -  PHQ-9 Score 0 0 -    GAD 7 : Generalized Anxiety Score 07/27/2021 07/27/2021  Nervous, Anxious, on Edge 0 0  Control/stop worrying 0 0  Worry too much - different things 0 0  Trouble relaxing 0 0  Restless 0 0  Easily annoyed or irritable 0 0  Afraid - awful might happen 0 0  Total GAD 7 Score 0 0      Upstream - 07/27/21 0850       Pregnancy Intention Screening   Does the patient want to become pregnant in the next year? N/A    Does the patient's partner want to become pregnant in the next year? No    Would the patient like to discuss contraceptive options today? No      Contraception Wrap Up   Current Method Female Sterilization    End Method Female Sterilization    Contraception Counseling Provided No            Examination chaperoned by [employer]  Dones CMA   Impression and plan: 1. Encounter for routine gynecological examination with Papanicolaou smear of cervix Pap sent Physical in 1 year Pap in 3 if normal Mammogram was negative in October. Colonoscopy 2023   2. Encounter for screening fecal occult blood testing   3. Essential hypertension Continue meds Stop Red Bull Check BP next week

## 2021-07-31 ENCOUNTER — Emergency Department (HOSPITAL_COMMUNITY)
Admission: EM | Admit: 2021-07-31 | Discharge: 2021-07-31 | Disposition: A | Discharge status: Home / Self Care | Payer: PPO | Attending: Emergency Medicine | Admitting: Emergency Medicine

## 2021-07-31 ENCOUNTER — Encounter (HOSPITAL_COMMUNITY): Payer: Self-pay | Admitting: *Deleted

## 2021-07-31 DIAGNOSIS — Z79899 Other long term (current) drug therapy: Secondary | ICD-10-CM | POA: Insufficient documentation

## 2021-07-31 DIAGNOSIS — Z7951 Long term (current) use of inhaled steroids: Secondary | ICD-10-CM | POA: Diagnosis not present

## 2021-07-31 DIAGNOSIS — T148XXA Other injury of unspecified body region, initial encounter: Secondary | ICD-10-CM

## 2021-07-31 DIAGNOSIS — S61032A Puncture wound without foreign body of left thumb without damage to nail, initial encounter: Secondary | ICD-10-CM | POA: Diagnosis not present

## 2021-07-31 DIAGNOSIS — S61239A Puncture wound without foreign body of unspecified finger without damage to nail, initial encounter: Secondary | ICD-10-CM

## 2021-07-31 DIAGNOSIS — J45901 Unspecified asthma with (acute) exacerbation: Secondary | ICD-10-CM | POA: Insufficient documentation

## 2021-07-31 DIAGNOSIS — Z87891 Personal history of nicotine dependence: Secondary | ICD-10-CM | POA: Insufficient documentation

## 2021-07-31 DIAGNOSIS — Y92009 Unspecified place in unspecified non-institutional (private) residence as the place of occurrence of the external cause: Secondary | ICD-10-CM | POA: Insufficient documentation

## 2021-07-31 DIAGNOSIS — W540XXA Bitten by dog, initial encounter: Secondary | ICD-10-CM | POA: Insufficient documentation

## 2021-07-31 DIAGNOSIS — Z96641 Presence of right artificial hip joint: Secondary | ICD-10-CM | POA: Insufficient documentation

## 2021-07-31 DIAGNOSIS — S61052A Open bite of left thumb without damage to nail, initial encounter: Secondary | ICD-10-CM | POA: Diagnosis not present

## 2021-07-31 DIAGNOSIS — I10 Essential (primary) hypertension: Secondary | ICD-10-CM | POA: Diagnosis not present

## 2021-07-31 LAB — CYTOLOGY - PAP
Comment: NEGATIVE
Diagnosis: NEGATIVE
High risk HPV: NEGATIVE

## 2021-07-31 MED ORDER — AMOXICILLIN-POT CLAVULANATE 875-125 MG PO TABS: 1.0000 | ORAL_TABLET | Freq: Two times a day (BID) | ORAL | 0 refills | Status: AC

## 2021-07-31 MED ORDER — TETANUS-DIPHTH-ACELL PERTUSSIS 5-2.5-18.5 LF-MCG/0.5 IM SUSY: 0.5000 mL | PREFILLED_SYRINGE | Freq: Once | INTRAMUSCULAR | Status: DC

## 2021-07-31 NOTE — ED Triage Notes (Signed)
Accidentally bitten by a field rat on left thumb

## 2021-07-31 NOTE — ED Provider Notes (Signed)
[facility] EMERGENCY DEPARTMENT Provider Note   CSN: [id number] Arrival date & time: 07/31/21  1305     History Chief Complaint  Patient presents with   Animal Bite    [name] is a 65 y.o. female.  Patient reports that her cat brought a field rat into the house.  Patient states she was trying to get it out of the house and it bit her left thumb.  Patient reports her doctor's office advised her to come to the emergency department for evaluation and possible rabies vaccines.  The history is provided by the patient. No language interpreter was used.  Animal Bite Contact animal:  Rodent     Past Medical History:  Diagnosis Date   Abnormal Pap smear of cervix    Anxiety    Arthritis    Asthma    Depression    Essential hypertension    Fibromyalgia    GERD (gastroesophageal reflux disease)    History of stroke    PVC's (premature ventricular contractions)    Sciatica of right side    Stroke ([place])    STROKE IN RIGHT EYE 20 YEAS AGO O PROBLEMS SINCE     Patient Active Problem List   Diagnosis Date Noted   Encounter for routine gynecological examination with Papanicolaou smear of cervix 07/27/2021   Body aches 06/26/2021   Emotional state symptom 06/26/2021   Asthma exacerbation 12/25/2020   Essential hypertension 12/25/2020   Regurgitation of food    Tired 07/26/2020   Always thirsty 07/26/2020   Rectal bleeding 07/26/2020   PMB (postmenopausal bleeding) 07/26/2020   Screening for diabetes mellitus 07/26/2020   RLQ abdominal pain 07/26/2020   S/P total right hip arthroplasty 07/08/2020   Globus sensation 12/22/2019   Dyspepsia    Acquired pyloric stenosis    S/P right knee arthroscopy 06/19/18 06/26/2018   Chondromalacia of medial femoral condyle, right    Primary osteoarthritis of right knee    Allergic reaction 09/16/2017   Allergic urticaria 09/16/2017   Angioedema 09/16/2017   Allergic rhinitis 09/16/2017   Generalized anxiety disorder  12/03/2016   Depression 12/03/2016   GERD (gastroesophageal reflux disease) 12/03/2016   Personal history of stroke with residual effects 12/03/2016   Moderate persistent asthma 12/02/2016   Fibromyalgia    Adhesive capsulitis of right shoulder 08/31/2013   Adhesive capsulitis 07/29/2013   Rotator cuff syndrome of left shoulder 07/29/2013   Low back pain 12/01/2012   BUNION 02/23/2008    Past Surgical History:  Procedure Laterality Date   24 HOUR [place] STUDY  10/31/2020   Procedure: 24 HOUR [place] STUDY;  Surgeon: Mauri Pole, MD;  Location: [facility];  Service: Endoscopy;;   ADENOIDECTOMY     APPENDECTOMY     BUNIONECTOMY Bilateral    CHONDROPLASTY Right 06/19/2018   Procedure: KNEE ARTHROSCOPY WITH chondroplasty;  Surgeon: Carole Civil, MD;  Location: [facility];  Service: Orthopedics;  Laterality: Right;   COLONOSCOPY  10/08/2011   diverticulosis, hyperplastic polyp removed. next tcs 2023.   ESOPHAGEAL MANOMETRY N/A 10/31/2020   Procedure: ESOPHAGEAL MANOMETRY (EM);  Surgeon: Mauri Pole, MD;  Location: [facility];  Service: Endoscopy;  Laterality: N/A;   ESOPHAGOGASTRODUODENOSCOPY N/A 03/12/2019   (EGD) with pyloric dilation;  Surgeon: Danie Binder, MD; nonobstructing Schatzki ring s/p dilation, moderate sized hiatal hernia, mild gastritis s/p biopsy, single gastric polyp s/p biopsy, possible gastroparesis with retained gastric contents.  Pathology benign with no H. pylori.   ESOPHAGOGASTRODUODENOSCOPY (EGD)  WITH PROPOFOL N/A 08/24/2019   normal stomach, low-grade Schatzki ring s/p dilation, LA Grade A reflux esophagitis, s/p biopsy. Normal esophageal biopsies   FOOT SURGERY  08/2018   FOOT SURGERY  05/14/2019   KNEE SURGERY  05/2018   PH IMPEDANCE STUDY N/A 10/31/2020   Procedure: [place] IMPEDANCE STUDY;  Surgeon: Mauri Pole, MD;  Location: [facility];  Service: Endoscopy;  Laterality: N/A;   POLYPECTOMY  03/12/2019   Procedure: POLYPECTOMY;  Surgeon:  Danie Binder, MD;  [facility];  Service: Endoscopy;;   TONSILLECTOMY     TOTAL HIP ARTHROPLASTY Right 07/08/2020   Procedure: TOTAL HIP ARTHROPLASTY;  Surgeon: Earlie Server, MD;  Location: [facility];  Service: Orthopedics;  Laterality: Right;   TUBAL LIGATION       OB History     Gravida  5   Para  5   Term  5   Preterm      AB      Living  5      SAB      IAB      Ectopic      Multiple      Live Births              Family History  Problem Relation Age of Onset   Hypertension Mother    Diverticulosis Mother    Hypertension Father    AAA (abdominal aortic aneurysm) Father    Cancer Brother        not sure where primary, was metastatic. was jaundice.    COPD Brother    Angioedema Neg Hx    Atopy Neg Hx    Eczema Neg Hx    Immunodeficiency Neg Hx    Urticaria Neg Hx    Asthma Neg Hx    Allergic rhinitis Neg Hx    Colon cancer Neg Hx     Social History   Tobacco Use   Smoking status: Former    Packs/day: 0.25    Years: 1.00    Pack years: 0.25    Types: Cigarettes    Quit date: 06/17/1975    Years since quitting: 46.1   Smokeless tobacco: Never  Vaping Use   Vaping Use: Never used  Substance Use Topics   Alcohol use: No   Drug use: No    Home Medications Prior to Admission medications   Medication Sig Start Date End Date Taking? Authorizing Provider  ALPRAZolam Duanne Moron) 1 MG tablet Take 1 mg by mouth at bedtime as needed for anxiety. 08/17/17   [provider]  amLODipine (NORVASC) 5 MG tablet Take 5 mg by mouth daily. 07/04/17   [provider]  budesonide-formoterol (SYMBICORT) 160-4.5 MCG/ACT inhaler Inhale 2 puffs into the lungs 2 (two) times daily.    [provider]  diphenhydrAMINE (BENADRYL) 25 mg capsule Take 25 mg by mouth as needed for itching or allergies.    [provider]  EPINEPHrine 0.3 mg/0.3 mL IJ SOAJ injection Inject 0.3 mg into the muscle as needed for anaphylaxis.     [provider]  lubiprostone (AMITIZA) 24 MCG capsule Take 24 mcg by mouth 2 (two) times daily. 07/11/20   [provider]  methocarbamol (ROBAXIN) 750 MG tablet Take 750 mg by mouth as needed. 08/16/20   [provider]  montelukast (SINGULAIR) 10 MG tablet Take 1 tablet (10 mg total) by mouth at bedtime. 06/05/21   Rigoberto Noel, MD  pantoprazole (PROTONIX) 40 MG tablet TAKE  ONE TABLET (40MG  TOTAL) BY MOUTH TWO TIMES DAILY BEFORE A MEAL 04/04/21   Annitta Needs, NP  PARoxetine (PAXIL) 20 MG tablet Take 20 mg by mouth daily. 06/12/21   [provider]  Prenatal Vit-Fe Fumarate-FA (MULTIVITAMIN-PRENATAL) 27-0.8 MG TABS tablet Take 1 tablet by mouth daily at 12 noon.    [provider]  PROAIR HFA 108 [phone] Base) MCG/ACT inhaler Inhale 2 puffs into the lungs as needed for wheezing or shortness of breath.    [provider]  topiramate (TOPAMAX) 200 MG tablet Take 200 mg by mouth 2 (two) times daily.     [provider]  VITAMIN D PO Take 1 tablet by mouth daily.    [provider]    Allergies    Patient has no known allergies.  Review of Systems   Review of Systems  All other systems reviewed and are negative.  Physical Exam Updated Vital Signs BP (!) 160/98   Pulse 75   Temp 98.3 F (36.8 C) (Oral)   Resp 20   Wt 79.4 kg   LMP 12/13/2015   SpO2 98%   BMI 27.41 kg/m   Physical Exam Vitals reviewed.  Constitutional:      Appearance: Normal appearance.  HENT:     Head: Normocephalic.  Cardiovascular:     Rate and Rhythm: Normal rate.  Pulmonary:     Effort: Pulmonary effort is normal.  Musculoskeletal:        General: No swelling or tenderness.  Skin:    General: Skin is warm.     Comments: 2 mm superficial laceration palmar aspect of left thumb, no gaping no redness.  Patient has full range of motion.  Neurological:     General: No focal deficit present.     Mental Status: She is alert.   Psychiatric:        Mood and Affect: Mood normal.    ED Results / Procedures / Treatments   Labs (all labs ordered are listed, but only abnormal results are displayed) Labs Reviewed - No data to display  EKG None  Radiology No results found.  Procedures Procedures   Medications Ordered in ED Medications - No data to display  ED Course  I have reviewed the triage vital signs and the nursing notes.  Pertinent labs & imaging results that were available during my care of the patient were reviewed by me and considered in my medical decision making (see chart for details).    MDM Rules/Calculators/A&P                           MDM: I spoke to the emergency department pharmacist at [facility] who advised rats do not carry rabies.  Patient does not require rabies vaccines.  Will treat with Augmentin for animal bite to left distal thumb.  We will update tetanus immunization.  Patient is advised to watch carefully for any signs of infection she is to return to the emergency department if any redness or swelling. Final Clinical Impression(s) / ED Diagnoses Final diagnoses:  None    Rx / DC Orders ED Discharge Orders     None        Fransico Meadow, [place] 07/31/21 1459    Daleen Bo, MD 08/01/21 1112

## 2021-07-31 NOTE — Discharge Instructions (Addendum)
Watch carefully for any sign of infection.  Return if any problems.  °

## 2021-08-07 ENCOUNTER — Other Ambulatory Visit: Payer: Self-pay | Admitting: Adult Health

## 2021-08-07 MED ORDER — PHENTERMINE HCL 37.5 MG PO CAPS: 37.5000 mg | ORAL_CAPSULE | ORAL | 0 refills | Status: DC

## 2021-08-07 NOTE — Progress Notes (Signed)
BP 132/78, she wants phentermine refilled, says Dr Glo Herring would give, will rx phentermine, to prevent weight gain

## 2021-08-29 ENCOUNTER — Other Ambulatory Visit: Payer: Self-pay | Admitting: Gastroenterology

## 2021-10-02 ENCOUNTER — Encounter: Payer: Self-pay | Admitting: *Deleted

## 2021-10-16 ENCOUNTER — Encounter: Payer: Self-pay | Admitting: *Deleted

## 2021-10-24 ENCOUNTER — Other Ambulatory Visit: Payer: Self-pay | Admitting: Orthopedic Surgery

## 2021-10-24 ENCOUNTER — Other Ambulatory Visit (HOSPITAL_COMMUNITY): Payer: Self-pay | Admitting: Orthopedic Surgery

## 2021-10-24 DIAGNOSIS — Z96641 Presence of right artificial hip joint: Secondary | ICD-10-CM | POA: Diagnosis not present

## 2021-10-24 DIAGNOSIS — M79651 Pain in right thigh: Secondary | ICD-10-CM | POA: Diagnosis not present

## 2021-10-24 DIAGNOSIS — M79674 Pain in right toe(s): Secondary | ICD-10-CM | POA: Diagnosis not present

## 2021-10-25 DIAGNOSIS — H2512 Age-related nuclear cataract, left eye: Secondary | ICD-10-CM | POA: Diagnosis not present

## 2021-11-02 ENCOUNTER — Encounter (HOSPITAL_COMMUNITY)
Admission: RE | Admit: 2021-11-02 | Discharge: 2021-11-02 | Disposition: A | Discharge status: Home / Self Care | Payer: Medicare Other | Source: Ambulatory Visit | Attending: Orthopedic Surgery | Admitting: Orthopedic Surgery

## 2021-11-02 ENCOUNTER — Other Ambulatory Visit: Payer: Self-pay

## 2021-11-02 DIAGNOSIS — M79651 Pain in right thigh: Secondary | ICD-10-CM | POA: Insufficient documentation

## 2021-11-02 DIAGNOSIS — Z96641 Presence of right artificial hip joint: Secondary | ICD-10-CM | POA: Diagnosis not present

## 2021-11-02 DIAGNOSIS — R948 Abnormal results of function studies of other organs and systems: Secondary | ICD-10-CM | POA: Diagnosis not present

## 2021-11-02 MED ORDER — TECHNETIUM TC 99M MEDRONATE IV KIT
20.0000 | PACK | Freq: Once | INTRAVENOUS | Status: AC | PRN
  Administered 2021-11-02: 20 via INTRAVENOUS

## 2021-11-06 ENCOUNTER — Encounter (HOSPITAL_COMMUNITY): Payer: Medicare Other

## 2021-11-06 ENCOUNTER — Other Ambulatory Visit (HOSPITAL_COMMUNITY): Payer: Medicare Other

## 2021-11-21 DIAGNOSIS — T84030D Mechanical loosening of internal right hip prosthetic joint, subsequent encounter: Secondary | ICD-10-CM | POA: Diagnosis not present

## 2021-11-21 DIAGNOSIS — M25551 Pain in right hip: Secondary | ICD-10-CM | POA: Diagnosis not present

## 2021-11-28 DIAGNOSIS — Z961 Presence of intraocular lens: Secondary | ICD-10-CM | POA: Diagnosis not present

## 2021-12-05 DIAGNOSIS — I700 Atherosclerosis of aorta: Secondary | ICD-10-CM | POA: Diagnosis not present

## 2021-12-05 DIAGNOSIS — S3991XA Unspecified injury of abdomen, initial encounter: Secondary | ICD-10-CM | POA: Diagnosis not present

## 2021-12-05 DIAGNOSIS — S52501A Unspecified fracture of the lower end of right radius, initial encounter for closed fracture: Secondary | ICD-10-CM | POA: Diagnosis not present

## 2021-12-05 DIAGNOSIS — S299XXA Unspecified injury of thorax, initial encounter: Secondary | ICD-10-CM | POA: Diagnosis not present

## 2021-12-05 DIAGNOSIS — M25522 Pain in left elbow: Secondary | ICD-10-CM | POA: Diagnosis not present

## 2021-12-05 DIAGNOSIS — M25521 Pain in right elbow: Secondary | ICD-10-CM | POA: Diagnosis not present

## 2021-12-05 DIAGNOSIS — M7989 Other specified soft tissue disorders: Secondary | ICD-10-CM | POA: Diagnosis not present

## 2021-12-05 DIAGNOSIS — R519 Headache, unspecified: Secondary | ICD-10-CM | POA: Diagnosis not present

## 2021-12-05 DIAGNOSIS — R52 Pain, unspecified: Secondary | ICD-10-CM | POA: Diagnosis not present

## 2021-12-05 DIAGNOSIS — K5730 Diverticulosis of large intestine without perforation or abscess without bleeding: Secondary | ICD-10-CM | POA: Diagnosis not present

## 2021-12-05 DIAGNOSIS — S52571A Other intraarticular fracture of lower end of right radius, initial encounter for closed fracture: Secondary | ICD-10-CM | POA: Diagnosis not present

## 2021-12-05 DIAGNOSIS — Z041 Encounter for examination and observation following transport accident: Secondary | ICD-10-CM | POA: Diagnosis not present

## 2021-12-05 DIAGNOSIS — N3000 Acute cystitis without hematuria: Secondary | ICD-10-CM | POA: Diagnosis not present

## 2021-12-05 DIAGNOSIS — S0990XA Unspecified injury of head, initial encounter: Secondary | ICD-10-CM | POA: Diagnosis not present

## 2021-12-05 DIAGNOSIS — M25531 Pain in right wrist: Secondary | ICD-10-CM | POA: Diagnosis not present

## 2021-12-05 DIAGNOSIS — M25539 Pain in unspecified wrist: Secondary | ICD-10-CM | POA: Diagnosis not present

## 2021-12-05 DIAGNOSIS — K449 Diaphragmatic hernia without obstruction or gangrene: Secondary | ICD-10-CM | POA: Diagnosis not present

## 2021-12-06 DIAGNOSIS — M25531 Pain in right wrist: Secondary | ICD-10-CM | POA: Diagnosis not present

## 2021-12-06 DIAGNOSIS — S52501A Unspecified fracture of the lower end of right radius, initial encounter for closed fracture: Secondary | ICD-10-CM | POA: Diagnosis not present

## 2021-12-08 DIAGNOSIS — S52571A Other intraarticular fracture of lower end of right radius, initial encounter for closed fracture: Secondary | ICD-10-CM | POA: Diagnosis not present

## 2021-12-08 DIAGNOSIS — Y999 Unspecified external cause status: Secondary | ICD-10-CM | POA: Diagnosis not present

## 2021-12-08 DIAGNOSIS — X58XXXA Exposure to other specified factors, initial encounter: Secondary | ICD-10-CM | POA: Diagnosis not present

## 2021-12-08 DIAGNOSIS — G8918 Other acute postprocedural pain: Secondary | ICD-10-CM | POA: Diagnosis not present

## 2021-12-13 ENCOUNTER — Ambulatory Visit: Payer: Self-pay

## 2021-12-18 ENCOUNTER — Ambulatory Visit: Payer: Medicare Other

## 2021-12-21 DIAGNOSIS — M13841 Other specified arthritis, right hand: Secondary | ICD-10-CM | POA: Diagnosis not present

## 2021-12-21 DIAGNOSIS — Z4789 Encounter for other orthopedic aftercare: Secondary | ICD-10-CM | POA: Diagnosis not present

## 2021-12-22 DIAGNOSIS — I10 Essential (primary) hypertension: Secondary | ICD-10-CM | POA: Diagnosis not present

## 2021-12-22 DIAGNOSIS — N1831 Chronic kidney disease, stage 3a: Secondary | ICD-10-CM | POA: Diagnosis not present

## 2021-12-22 DIAGNOSIS — Z79899 Other long term (current) drug therapy: Secondary | ICD-10-CM | POA: Diagnosis not present

## 2022-01-02 DIAGNOSIS — M25551 Pain in right hip: Secondary | ICD-10-CM | POA: Diagnosis not present

## 2022-01-02 DIAGNOSIS — E785 Hyperlipidemia, unspecified: Secondary | ICD-10-CM | POA: Diagnosis not present

## 2022-01-02 DIAGNOSIS — I10 Essential (primary) hypertension: Secondary | ICD-10-CM | POA: Diagnosis not present

## 2022-01-02 DIAGNOSIS — N1831 Chronic kidney disease, stage 3a: Secondary | ICD-10-CM | POA: Diagnosis not present

## 2022-01-03 DIAGNOSIS — Z96641 Presence of right artificial hip joint: Secondary | ICD-10-CM | POA: Diagnosis not present

## 2022-01-03 DIAGNOSIS — M25551 Pain in right hip: Secondary | ICD-10-CM | POA: Diagnosis not present

## 2022-01-03 DIAGNOSIS — T8484XA Pain due to internal orthopedic prosthetic devices, implants and grafts, initial encounter: Secondary | ICD-10-CM | POA: Diagnosis not present

## 2022-01-04 DIAGNOSIS — Z4789 Encounter for other orthopedic aftercare: Secondary | ICD-10-CM | POA: Diagnosis not present

## 2022-01-04 DIAGNOSIS — M25631 Stiffness of right wrist, not elsewhere classified: Secondary | ICD-10-CM | POA: Diagnosis not present

## 2022-01-04 DIAGNOSIS — M7022 Olecranon bursitis, left elbow: Secondary | ICD-10-CM | POA: Diagnosis not present

## 2022-01-08 DIAGNOSIS — M25631 Stiffness of right wrist, not elsewhere classified: Secondary | ICD-10-CM | POA: Diagnosis not present

## 2022-01-10 ENCOUNTER — Other Ambulatory Visit: Payer: Self-pay | Admitting: Gastroenterology

## 2022-01-16 DIAGNOSIS — M25631 Stiffness of right wrist, not elsewhere classified: Secondary | ICD-10-CM | POA: Diagnosis not present

## 2022-01-22 DIAGNOSIS — M25631 Stiffness of right wrist, not elsewhere classified: Secondary | ICD-10-CM | POA: Diagnosis not present

## 2022-01-24 DIAGNOSIS — M25631 Stiffness of right wrist, not elsewhere classified: Secondary | ICD-10-CM | POA: Diagnosis not present

## 2022-01-29 DIAGNOSIS — M25631 Stiffness of right wrist, not elsewhere classified: Secondary | ICD-10-CM | POA: Diagnosis not present

## 2022-01-31 ENCOUNTER — Encounter: Payer: Self-pay | Admitting: Internal Medicine

## 2022-01-31 DIAGNOSIS — M25631 Stiffness of right wrist, not elsewhere classified: Secondary | ICD-10-CM | POA: Diagnosis not present

## 2022-02-05 DIAGNOSIS — S63632A Sprain of interphalangeal joint of right middle finger, initial encounter: Secondary | ICD-10-CM | POA: Diagnosis not present

## 2022-02-05 DIAGNOSIS — M25631 Stiffness of right wrist, not elsewhere classified: Secondary | ICD-10-CM | POA: Diagnosis not present

## 2022-02-05 DIAGNOSIS — M13841 Other specified arthritis, right hand: Secondary | ICD-10-CM | POA: Diagnosis not present

## 2022-02-07 DIAGNOSIS — M25631 Stiffness of right wrist, not elsewhere classified: Secondary | ICD-10-CM | POA: Diagnosis not present

## 2022-02-07 DIAGNOSIS — L814 Other melanin hyperpigmentation: Secondary | ICD-10-CM | POA: Diagnosis not present

## 2022-02-07 DIAGNOSIS — X32XXXD Exposure to sunlight, subsequent encounter: Secondary | ICD-10-CM | POA: Diagnosis not present

## 2022-02-07 DIAGNOSIS — L820 Inflamed seborrheic keratosis: Secondary | ICD-10-CM | POA: Diagnosis not present

## 2022-02-07 DIAGNOSIS — L821 Other seborrheic keratosis: Secondary | ICD-10-CM | POA: Diagnosis not present

## 2022-02-07 DIAGNOSIS — L570 Actinic keratosis: Secondary | ICD-10-CM | POA: Diagnosis not present

## 2022-02-12 DIAGNOSIS — M25631 Stiffness of right wrist, not elsewhere classified: Secondary | ICD-10-CM | POA: Diagnosis not present

## 2022-02-19 DIAGNOSIS — M25631 Stiffness of right wrist, not elsewhere classified: Secondary | ICD-10-CM | POA: Diagnosis not present

## 2022-02-21 DIAGNOSIS — M25631 Stiffness of right wrist, not elsewhere classified: Secondary | ICD-10-CM | POA: Diagnosis not present

## 2022-02-26 DIAGNOSIS — M25631 Stiffness of right wrist, not elsewhere classified: Secondary | ICD-10-CM | POA: Diagnosis not present

## 2022-03-01 DIAGNOSIS — Z4789 Encounter for other orthopedic aftercare: Secondary | ICD-10-CM | POA: Diagnosis not present

## 2022-03-01 DIAGNOSIS — S63632A Sprain of interphalangeal joint of right middle finger, initial encounter: Secondary | ICD-10-CM | POA: Diagnosis not present

## 2022-03-01 DIAGNOSIS — M7022 Olecranon bursitis, left elbow: Secondary | ICD-10-CM | POA: Diagnosis not present

## 2022-03-01 DIAGNOSIS — M13841 Other specified arthritis, right hand: Secondary | ICD-10-CM | POA: Diagnosis not present

## 2022-03-05 DIAGNOSIS — M25631 Stiffness of right wrist, not elsewhere classified: Secondary | ICD-10-CM | POA: Diagnosis not present

## 2022-03-07 DIAGNOSIS — M25631 Stiffness of right wrist, not elsewhere classified: Secondary | ICD-10-CM | POA: Diagnosis not present

## 2022-03-12 DIAGNOSIS — M25631 Stiffness of right wrist, not elsewhere classified: Secondary | ICD-10-CM | POA: Diagnosis not present

## 2022-03-21 DIAGNOSIS — M25631 Stiffness of right wrist, not elsewhere classified: Secondary | ICD-10-CM | POA: Diagnosis not present

## 2022-03-26 ENCOUNTER — Telehealth: Payer: Medicare Other | Admitting: Gastroenterology

## 2022-03-28 DIAGNOSIS — M25631 Stiffness of right wrist, not elsewhere classified: Secondary | ICD-10-CM | POA: Diagnosis not present

## 2022-03-30 DIAGNOSIS — I700 Atherosclerosis of aorta: Secondary | ICD-10-CM | POA: Diagnosis not present

## 2022-03-30 DIAGNOSIS — Z79899 Other long term (current) drug therapy: Secondary | ICD-10-CM | POA: Diagnosis not present

## 2022-03-30 DIAGNOSIS — I10 Essential (primary) hypertension: Secondary | ICD-10-CM | POA: Diagnosis not present

## 2022-03-30 DIAGNOSIS — N1831 Chronic kidney disease, stage 3a: Secondary | ICD-10-CM | POA: Diagnosis not present

## 2022-03-30 DIAGNOSIS — E785 Hyperlipidemia, unspecified: Secondary | ICD-10-CM | POA: Diagnosis not present

## 2022-04-04 ENCOUNTER — Telehealth: Payer: Medicare Other | Admitting: Gastroenterology

## 2022-04-05 DIAGNOSIS — M13841 Other specified arthritis, right hand: Secondary | ICD-10-CM | POA: Diagnosis not present

## 2022-04-05 DIAGNOSIS — Z4789 Encounter for other orthopedic aftercare: Secondary | ICD-10-CM | POA: Diagnosis not present

## 2022-04-05 DIAGNOSIS — M7022 Olecranon bursitis, left elbow: Secondary | ICD-10-CM | POA: Diagnosis not present

## 2022-04-05 DIAGNOSIS — S63632A Sprain of interphalangeal joint of right middle finger, initial encounter: Secondary | ICD-10-CM | POA: Diagnosis not present

## 2022-04-06 DIAGNOSIS — N1831 Chronic kidney disease, stage 3a: Secondary | ICD-10-CM | POA: Diagnosis not present

## 2022-04-06 DIAGNOSIS — E785 Hyperlipidemia, unspecified: Secondary | ICD-10-CM | POA: Diagnosis not present

## 2022-04-06 DIAGNOSIS — E875 Hyperkalemia: Secondary | ICD-10-CM | POA: Diagnosis not present

## 2022-04-06 DIAGNOSIS — I10 Essential (primary) hypertension: Secondary | ICD-10-CM | POA: Diagnosis not present

## 2022-04-07 NOTE — Progress Notes (Unsigned)
Referring Provider: Asencion Noble, MD Primary Care Physician:  Asencion Noble, MD Primary GI Physician: Dr. Abbey Chatters  No chief complaint on file.   HPI:   [name] is a 66 y.o. female with history of GERD, reflux esophagitis with negative biopsies, Schatzki ring s/p dilation (2020, 2021***), HH, chronic globus sensation.  GES normal in 2020.  Previously seen by ENT and allergist. She is presenting today for follow-up and to discuss scheduling a colonoscopy.   Last seen in our office in February 2022. Continued with breakthrough GERD on Protonix BID. Felt she had  a fur ball stuck in her throat at times. Some itching after meals depending on what she ate. Was taking baking soda and ginger ale for breakthrough. Recommended PH/impedance and manometry.   Manometry 10/31/20: Normal   pH/impedance 10/31/20: Catheter malfunction with no impedance data, good acid suppression on PPI with slightly elevated supine esophageal acid exposure.  Recommended considering repeat pH study for impedance data if clinically indicated.  Last colonoscopy was in Feb. 2013 with diverticulosis, hyperplastic polyp.  Recommended 10-year repeat.  Today:      Previously on Dexialnt, Prevacid BID, Protonix BID  Past Medical History:  Diagnosis Date   Abnormal Pap smear of cervix    Anxiety    Arthritis    Asthma    Depression    Essential hypertension    Fibromyalgia    GERD (gastroesophageal reflux disease)    History of stroke    PVC's (premature ventricular contractions)    Sciatica of right side    Stroke ([place])    STROKE IN RIGHT EYE 20 YEAS AGO O PROBLEMS SINCE     Past Surgical History:  Procedure Laterality Date   24 HOUR [place] STUDY  10/31/2020   Procedure: 24 HOUR [place] STUDY;  Surgeon: Mauri Pole, MD;  [facility];  Service: Endoscopy;;   ADENOIDECTOMY     APPENDECTOMY     BUNIONECTOMY Bilateral    CHONDROPLASTY Right 06/19/2018   Procedure: KNEE ARTHROSCOPY WITH  chondroplasty;  Surgeon: Carole Civil, MD;  Location: [facility];  Service: Orthopedics;  Laterality: Right;   COLONOSCOPY  10/08/2011   diverticulosis, hyperplastic polyp removed. next tcs 2023.   ESOPHAGEAL MANOMETRY N/A 10/31/2020   Procedure: ESOPHAGEAL MANOMETRY (EM);  Surgeon: Mauri Pole, MD;  [facility];  Service: Endoscopy;  Laterality: N/A;   ESOPHAGOGASTRODUODENOSCOPY N/A 03/12/2019   (EGD) with pyloric dilation;  Surgeon: Danie Binder, MD; nonobstructing Schatzki ring s/p dilation, moderate sized hiatal hernia, mild gastritis s/p biopsy, single gastric polyp s/p biopsy, possible gastroparesis with retained gastric contents.  Pathology benign with no H. pylori.   ESOPHAGOGASTRODUODENOSCOPY (EGD) WITH PROPOFOL N/A 08/24/2019   normal stomach, low-grade Schatzki ring s/p dilation, LA Grade A reflux esophagitis, s/p biopsy. Normal esophageal biopsies   FOOT SURGERY  08/2018   FOOT SURGERY  05/14/2019   KNEE SURGERY  05/2018   PH IMPEDANCE STUDY N/A 10/31/2020   Procedure: [place] IMPEDANCE STUDY;  Surgeon: Mauri Pole, MD;  [facility];  Service: Endoscopy;  Laterality: N/A;   POLYPECTOMY  03/12/2019   Procedure: POLYPECTOMY;  Surgeon: Danie Binder, MD;  [facility];  Service: Endoscopy;;   TONSILLECTOMY     TOTAL HIP ARTHROPLASTY Right 07/08/2020   Procedure: TOTAL HIP ARTHROPLASTY;  Surgeon: Earlie Server, MD;  [facility];  Service: Orthopedics;  Laterality: Right;   TUBAL LIGATION      Current Outpatient Medications  Medication Sig Dispense Refill   ALPRAZolam (XANAX) 1 MG tablet Take 1 mg by mouth at bedtime as needed for anxiety.  0   amLODipine (NORVASC) 5 MG tablet Take 5 mg by mouth daily.  0   budesonide-formoterol (SYMBICORT) 160-4.5 MCG/ACT inhaler Inhale 2 puffs into the lungs 2 (two) times daily.     diphenhydrAMINE (BENADRYL) 25 mg capsule Take 25 mg by mouth as needed for itching or allergies.     EPINEPHrine  0.3 mg/0.3 mL IJ SOAJ injection Inject 0.3 mg into the muscle as needed for anaphylaxis.     lubiprostone (AMITIZA) 24 MCG capsule Take 24 mcg by mouth 2 (two) times daily.     methocarbamol (ROBAXIN) 750 MG tablet Take 750 mg by mouth as needed.     montelukast (SINGULAIR) 10 MG tablet Take 1 tablet (10 mg total) by mouth at bedtime. 30 tablet 11   pantoprazole (PROTONIX) 40 MG tablet TAKE ONE TABLET ('40MG'$  TOTAL) BY MOUTH TWO TIMES DAILY BEFORE A MEAL 60 tablet 3   PARoxetine (PAXIL) 20 MG tablet Take 20 mg by mouth daily.     phentermine 37.5 MG capsule Take 1 capsule (37.5 mg total) by mouth every morning. 30 capsule 0   Prenatal Vit-Fe Fumarate-FA (MULTIVITAMIN-PRENATAL) 27-0.8 MG TABS tablet Take 1 tablet by mouth daily at 12 noon.     PROAIR HFA 108 (90 Base) MCG/ACT inhaler Inhale 2 puffs into the lungs as needed for wheezing or shortness of breath.  1   topiramate (TOPAMAX) 200 MG tablet Take 200 mg by mouth 2 (two) times daily.      VITAMIN D PO Take 1 tablet by mouth daily.     No current facility-administered medications for this visit.    Allergies as of 04/09/2022   (No Known Allergies)    Family History  Problem Relation Age of Onset   Hypertension Mother    Diverticulosis Mother    Hypertension Father    AAA (abdominal aortic aneurysm) Father    Cancer Brother        not sure where primary, was metastatic. was jaundice.    COPD Brother    Angioedema Neg Hx    Atopy Neg Hx    Eczema Neg Hx    Immunodeficiency Neg Hx    Urticaria Neg Hx    Asthma Neg Hx    Allergic rhinitis Neg Hx    Colon cancer Neg Hx     Social History   Socioeconomic History   Marital status: Married    Spouse name: Not on file   Number of children: Not on file   Years of education: Not on file   Highest education level: Not on file  Occupational History   Not on file  Tobacco Use   Smoking status: Former    Packs/day: 0.25    Years: 1.00    Total pack years: 0.25    Types:  Cigarettes    Quit date: 06/17/1975    Years since quitting: 46.8   Smokeless tobacco: Never  Vaping Use   Vaping Use: Never used  Substance and Sexual Activity   Alcohol use: No   Drug use: No   Sexual activity: Not Currently    Birth control/protection: Surgical, Post-menopausal    Comment: tubal  Other Topics Concern   Not on file  Social History Narrative   Not on file   Social Determinants of Health   Financial Resource Strain: Low Risk  (07/27/2021)   Overall  Financial Resource Strain (CARDIA)    Difficulty of Paying Living Expenses: Not very hard  Food Insecurity: No Food Insecurity (07/27/2021)   Hunger Vital Sign    Worried About Running Out of Food in the Last Year: Never true    Ran Out of Food in the Last Year: Never true  Transportation Needs: No Transportation Needs (07/27/2021)   PRAPARE - Hydrologist (Medical): No    Lack of Transportation (Non-Medical): No  Physical Activity: Insufficiently Active (07/27/2021)   Exercise Vital Sign    Days of Exercise per Week: 3 days    Minutes of Exercise per Session: 20 min  Stress: No Stress Concern Present (07/27/2021)   Tolu    Feeling of Stress : Only a little  Social Connections: Moderately Integrated (07/27/2021)   Social Connection and Isolation Panel [NHANES]    Frequency of Communication with Friends and Family: More than three times a week    Frequency of Social Gatherings with Friends and Family: Twice a week    Attends Religious Services: More than 4 times per year    Active Member of [employer] or Organizations: Yes    Attends Archivist Meetings: Never    Marital Status: Divorced    Review of Systems: Gen: Denies fever, chills, cold or flu like symptoms, pre-syncope, or syncope.  CV: Denies chest pain, palpitations. Resp: Denies dyspnea, cough.  GI: See HPI Heme:See HPI.  Physical Exam: LMP  12/13/2015  General:   Alert and oriented. No distress noted. Pleasant and cooperative.  Head:  Normocephalic and atraumatic. Eyes:  Conjuctiva clear without scleral icterus. Heart:  S1, S2 present without murmurs appreciated. Lungs:  Clear to auscultation bilaterally. No wheezes, rales, or rhonchi. No distress.  Abdomen:  +BS, soft, non-tender and non-distended. No rebound or guarding. No HSM or masses noted. Msk:  Symmetrical without gross deformities. Normal posture. Extremities:  Without edema. Neurologic:  Alert and  oriented x4 Psych:  Normal mood and affect.    Assessment:     Plan:  ***   Aliene Altes, PA-C [facility] 04/09/2022

## 2022-04-07 NOTE — H&P (View-Only) (Signed)
Referring Provider: Asencion Noble, MD Primary Care Physician:  Asencion Noble, MD Primary GI Physician: Dr. Abbey Chatters  Chief Complaint  Patient presents with   Gastroesophageal Reflux    Acid reflux all the time. Lower abdominal issues. Bowel are not looking like they should.     HPI:   [name] is a 66 y.o. female with history of hyperplastic colon polyp, GERD, reflux esophagitis with negative biopsies, Schatzki ring s/p dilation (2020, 2021), HH, chronic globus sensation.  GES normal in 2020.  Previously seen by ENT and allergist. She is presenting today for follow-up and to discuss scheduling a colonoscopy.   Last seen in our office in February 2022. Continued with breakthrough GERD on Protonix BID. Felt she had a fur ball stuck in her throat at times. Some itching after meals depending on what she ate. Was taking baking soda and ginger ale for breakthrough. Recommended PH/impedance and manometry.   Manometry 10/31/20: Normal   pH/impedance 10/31/20: Catheter malfunction with no impedance data, good acid suppression on PPI with slightly elevated supine esophageal acid exposure.  Recommended considering repeat pH study for impedance data if clinically indicated.  Last colonoscopy was in Feb. 2013 with diverticulosis, hyperplastic polyp.  Recommended 10-year repeat.   Today:  Reports developing pressure in her chest after eating that resolves once she belches.  Does not matter what she eats.  She has to drink a small amount of baking soda with ginger ale in order to belch, but has resolution of symptoms thereafter.  No typical heartburn symptoms or reflux.  Denies nausea, vomiting, dysphagia.  Doesn't drink carbonated beverages unless drinking ginger ale to make herself burp. Drinks water through a straw at times. Eats quickly if she has her young grandchildren, but not routinely. Asking to try different acid reflux medication as she has been on Protonix for quite some time.  Previously  tried Building surveyor and Prevacid twice daily.  Previously on Dexialnt, Prevacid BID, Protonix BID  Feels she is allergic to everything. Breaking out in hives intermittently, but hasn't been able to identify what her triggers are.  Would like referral to allergist.  Has history of constipation. Taking Amitiza 24 mcg at night, prescribed by Dr. Willey Blade.  This allows her to have a good bowel movement every day.  Stools seem to be a little more on the mushy side, but she is not particularly bothered by this, she just did not know if this was "normal".  She is satisfied with how Amitiza is working for her.  No BRBPR, melena, abdominal pain.  No unintentional weight loss.  No Fhx of colon cancer.   Past Medical History:  Diagnosis Date   Abnormal Pap smear of cervix    Anxiety    Arthritis    Asthma    Depression    Essential hypertension    Fibromyalgia    GERD (gastroesophageal reflux disease)    History of stroke    PVC's (premature ventricular contractions)    Sciatica of right side    Stroke ([place])    STROKE IN RIGHT EYE 20 YEAS AGO O PROBLEMS SINCE     Past Surgical History:  Procedure Laterality Date   24 HOUR [place] STUDY  10/31/2020   Procedure: 24 HOUR [place] STUDY;  Surgeon: Mauri Pole, MD;  Location: [facility];  Service: Endoscopy;;   ADENOIDECTOMY     APPENDECTOMY     BUNIONECTOMY Bilateral    CHONDROPLASTY Right 06/19/2018   Procedure: KNEE ARTHROSCOPY  WITH chondroplasty;  Surgeon: Carole Civil, MD;  Location: [facility];  Service: Orthopedics;  Laterality: Right;   COLONOSCOPY  10/08/2011   diverticulosis, hyperplastic polyp removed. next tcs 2023.   ESOPHAGEAL MANOMETRY N/A 10/31/2020   Procedure: ESOPHAGEAL MANOMETRY (EM);  Surgeon: Mauri Pole, MD;  Location: [facility];  Service: Endoscopy;  Laterality: N/A;   ESOPHAGOGASTRODUODENOSCOPY N/A 03/12/2019   (EGD) with pyloric dilation;  Surgeon: Danie Binder, MD; nonobstructing Schatzki ring s/p dilation,  moderate sized hiatal hernia, mild gastritis s/p biopsy, single gastric polyp s/p biopsy, possible gastroparesis with retained gastric contents.  Pathology benign with no H. pylori.   ESOPHAGOGASTRODUODENOSCOPY (EGD) WITH PROPOFOL N/A 08/24/2019   normal stomach, low-grade Schatzki ring s/p dilation, LA Grade A reflux esophagitis, s/p biopsy. Normal esophageal biopsies   FOOT SURGERY  08/2018   FOOT SURGERY  05/14/2019   KNEE SURGERY  05/2018   PH IMPEDANCE STUDY N/A 10/31/2020   Procedure: [place] IMPEDANCE STUDY;  Surgeon: Mauri Pole, MD;  Location: [facility];  Service: Endoscopy;  Laterality: N/A;   POLYPECTOMY  03/12/2019   Procedure: POLYPECTOMY;  Surgeon: Danie Binder, MD;  Location: [facility];  Service: Endoscopy;;   TONSILLECTOMY     TOTAL HIP ARTHROPLASTY Right 07/08/2020   Procedure: TOTAL HIP ARTHROPLASTY;  Surgeon: Earlie Server, MD;  Location: [facility];  Service: Orthopedics;  Laterality: Right;   TUBAL LIGATION      Current Outpatient Medications  Medication Sig Dispense Refill   amLODipine (NORVASC) 5 MG tablet Take 5 mg by mouth daily.  0   budesonide-formoterol (SYMBICORT) 160-4.5 MCG/ACT inhaler Inhale 2 puffs into the lungs 2 (two) times daily.     diphenhydrAMINE (BENADRYL) 25 mg capsule Take 25 mg by mouth as needed for itching or allergies.     EPINEPHrine 0.3 mg/0.3 mL IJ SOAJ injection Inject 0.3 mg into the muscle as needed for anaphylaxis.     levocetirizine (XYZAL) 5 MG tablet Take 5 mg by mouth daily.     lubiprostone (AMITIZA) 24 MCG capsule Take 24 mcg by mouth 2 (two) times daily.     PARoxetine (PAXIL) 20 MG tablet Take 20 mg by mouth daily.     phentermine 37.5 MG capsule Take 1 capsule (37.5 mg total) by mouth every morning. 30 capsule 0   Prenatal Vit-Fe Fumarate-FA (MULTIVITAMIN-PRENATAL) 27-0.8 MG TABS tablet Take 1 tablet by mouth daily at 12 noon.     PROAIR HFA 108 (90 Base) MCG/ACT inhaler Inhale 2 puffs into the lungs as needed for  wheezing or shortness of breath.  1   RABEprazole (ACIPHEX) 20 MG tablet Take 1 tablet (20 mg total) by mouth 2 (two) times daily before a meal. 60 tablet 5   rosuvastatin (CRESTOR) 20 MG tablet Take 20 mg by mouth at bedtime.     topiramate (TOPAMAX) 200 MG tablet Take 200 mg by mouth 2 (two) times daily.      traZODone (DESYREL) 50 MG tablet Take 25-50 mg by mouth at bedtime as needed.     VITAMIN D PO Take 1 tablet by mouth daily.     ALPRAZolam (XANAX) 1 MG tablet Take 1 mg by mouth at bedtime as needed for anxiety. (Patient not taking: Reported on 04/09/2022)  0   Na Sulfate-K Sulfate-Mg Sulf 17.5-3.13-1.6 GM/177ML SOLN As directed 354 mL 0   No current facility-administered medications for this visit.    Allergies as of 04/09/2022   (No Known Allergies)  Family History  Problem Relation Age of Onset   Hypertension Mother    Diverticulosis Mother    Hypertension Father    AAA (abdominal aortic aneurysm) Father    Cancer Brother        not sure where primary, was metastatic. was jaundice.    COPD Brother    Angioedema Neg Hx    Atopy Neg Hx    Eczema Neg Hx    Immunodeficiency Neg Hx    Urticaria Neg Hx    Asthma Neg Hx    Allergic rhinitis Neg Hx    Colon cancer Neg Hx     Social History   Socioeconomic History   Marital status: Married    Spouse name: Not on file   Number of children: Not on file   Years of education: Not on file   Highest education level: Not on file  Occupational History   Not on file  Tobacco Use   Smoking status: Former    Packs/day: 0.25    Years: 1.00    Total pack years: 0.25    Types: Cigarettes    Quit date: 06/17/1975    Years since quitting: 46.8   Smokeless tobacco: Never  Vaping Use   Vaping Use: Never used  Substance and Sexual Activity   Alcohol use: No   Drug use: No   Sexual activity: Not Currently    Birth control/protection: Surgical, Post-menopausal    Comment: tubal  Other Topics Concern   Not on file  Social  History Narrative   Not on file   Social Determinants of Health   Financial Resource Strain: Low Risk  (07/27/2021)   Overall Financial Resource Strain (CARDIA)    Difficulty of Paying Living Expenses: Not very hard  Food Insecurity: No Food Insecurity (07/27/2021)   Hunger Vital Sign    Worried About Running Out of Food in the Last Year: Never true    Ran Out of Food in the Last Year: Never true  Transportation Needs: No Transportation Needs (07/27/2021)   PRAPARE - Hydrologist (Medical): No    Lack of Transportation (Non-Medical): No  Physical Activity: Insufficiently Active (07/27/2021)   Exercise Vital Sign    Days of Exercise per Week: 3 days    Minutes of Exercise per Session: 20 min  Stress: No Stress Concern Present (07/27/2021)   [place]    Feeling of Stress : Only a little  Social Connections: Moderately Integrated (07/27/2021)   Social Connection and Isolation Panel [NHANES]    Frequency of Communication with Friends and Family: More than three times a week    Frequency of Social Gatherings with Friends and Family: Twice a week    Attends Religious Services: More than 4 times per year    Active Member of [employer] or Organizations: Yes    Attends Archivist Meetings: Never    Marital Status: Divorced    Review of Systems: Gen: Denies fever, chills, cold or flu like symptoms, pre-syncope, or syncope.  CV: Denies chest pain, palpitations. Resp: Denies dyspnea, cough.  GI: See HPI Heme:See HPI.  Physical Exam: BP 125/75 (BP Location: Right Arm, Patient Position: Sitting, Cuff Size: Normal)   Pulse 74   Temp 97.6 F (36.4 C) (Temporal)   Ht '5\' 7"'$  (1.702 m)   Wt 178 lb 12.8 oz (81.1 kg)   LMP 12/13/2015   SpO2 96%   BMI 28.00 kg/m  General:   Alert and oriented. No distress noted. Pleasant and cooperative.  Head:  Normocephalic and atraumatic. Eyes:  Conjuctiva  clear without scleral icterus. Heart:  S1, S2 present without murmurs appreciated. Lungs:  Clear to auscultation bilaterally. No wheezes, rales, or rhonchi. No distress.  Abdomen:  +BS, soft, non-tender and non-distended. No rebound or guarding. No HSM or masses noted. Msk:  Symmetrical without gross deformities. Normal posture. Extremities:  Without edema. Neurologic:  Alert and  oriented x4 Psych:  Normal mood and affect.    Assessment:  66 y.o. female with history of hyperplastic colon polyp, GERD, reflux esophagitis with negative biopsies, Schatzki ring s/p dilation (2020, 2021), HH, chronic globus sensation, presenting today for follow-up to discuss scheduling colonoscopy.   GERD:  Typical heartburn and reflux symptoms are well controlled on Protonix twice daily; however, patient reports postprandial pressure in her chest that resolves with belching.  She requires drinking a small amount of baking soda with ginger ale in order to produce a belch, but symptoms completely resolve thereafter.  No alarm symptoms.  Manometry and pH testing essentially normal in March 2022.  Only with slightly elevated supine esophageal acid exposure.  Unfortunately, there was catheter malfunction and no impedance data was recorded; however, does not sound that patient is having any significant reflux at this time. She is requesting to try a different acid reflux medication to see if this will limit the postprandial pressure/belching.  We will try her on Aciphex that she is previously tried Building surveyor and Prevacid.  Constipation: Doing well on Amitiza 24 mcg nightly, prescribed by Dr. Willey Blade.  No alarm symptoms.  History of colon polyps: History of hyperplastic colon polyp.  Last colonoscopy in February 2013 with diverticulosis, hyperplastic polyp removed, recommended 10-year surveillance.  Currently without lower GI or alarm symptoms.  No family history of colon cancer.  Hives: Patient reports developing hives  postprandially without identified food trigger, requesting referral to allergist.   Plan:  Proceed with colonoscopy with propofol by Dr. Abbey Chatters in near future. The risks, benefits, and alternatives have been discussed with the patient in detail. The patient states understanding and desires to proceed.  ASA 3 Hold phentermine time 7 days prior to procedure Stop Protonix and start Aciphex 20 mg twice daily 30 minutes before breakfast and dinner. Reinforced GERD diet/lifestyle.  Separate instructions provided.  See AVS. Recommend eating slowly, chew thoroughly, avoid drinking through a straw.  Continue Amitiza 24 mcg nightly. Refer to allergist to evaluate for food allergies. Follow-up in 6 months or sooner if needed.   Aliene Altes, PA-C [facility] 04/09/2022

## 2022-04-09 ENCOUNTER — Encounter: Payer: Self-pay | Admitting: *Deleted

## 2022-04-09 ENCOUNTER — Other Ambulatory Visit: Payer: Self-pay | Admitting: *Deleted

## 2022-04-09 ENCOUNTER — Encounter: Payer: Self-pay | Admitting: Gastroenterology

## 2022-04-09 ENCOUNTER — Telehealth: Payer: Self-pay | Admitting: *Deleted

## 2022-04-09 ENCOUNTER — Ambulatory Visit: Payer: Medicare Other | Admitting: Gastroenterology

## 2022-04-09 VITALS — BP 125/75 | HR 74 | Temp 97.6°F | Ht 67.0 in | Wt 178.8 lb

## 2022-04-09 DIAGNOSIS — Z86010 Personal history of colonic polyps: Secondary | ICD-10-CM | POA: Diagnosis not present

## 2022-04-09 DIAGNOSIS — K219 Gastro-esophageal reflux disease without esophagitis: Secondary | ICD-10-CM

## 2022-04-09 DIAGNOSIS — K5900 Constipation, unspecified: Secondary | ICD-10-CM

## 2022-04-09 DIAGNOSIS — L509 Urticaria, unspecified: Secondary | ICD-10-CM | POA: Diagnosis not present

## 2022-04-09 MED ORDER — NA SULFATE-K SULFATE-MG SULF 17.5-3.13-1.6 GM/177ML PO SOLN: ORAL | 0 refills | Status: DC

## 2022-04-09 MED ORDER — RABEPRAZOLE SODIUM 20 MG PO TBEC: 20.0000 mg | DELAYED_RELEASE_TABLET | Freq: Two times a day (BID) | ORAL | 5 refills | Status: DC

## 2022-04-09 NOTE — Patient Instructions (Signed)
We will arrange for you to have a colonoscopy with Dr. Abbey Chatters in the near future.  If you start phentermine, hold this for 7 days prior to your procedure.   Stop Protonix and start Aciphex (rabeprazole) 20 mg twice daily 30 minutes before breakfast and dinner.   Follow a GERD diet:  Avoid fried, fatty, greasy, spicy, citrus foods. Avoid caffeine and carbonated beverages. Avoid chocolate. Try eating 4-6 small meals a day rather than 3 large meals. Do not eat within 3 hours of laying down. Prop head of bed up on wood or bricks to create a 6 inch incline.  Eat slowly, chew thoroughly, avoid drinking through a straw.  Continue Amitiza 24 mcg nightly.  We are referring you to an allergist to evaluate for possible food allergies.  We will follow-up with you in about 6 months.  Do not hesitate to call if you have any questions or concerns prior to your next visit.  Aliene Altes, PA-C [facility]

## 2022-04-09 NOTE — Telephone Encounter (Signed)
Colonoscopy PA:  APPROVED: Authorization # [id number]  DOS: 04/30/22-07/29/22

## 2022-04-13 DIAGNOSIS — Z96641 Presence of right artificial hip joint: Secondary | ICD-10-CM | POA: Diagnosis not present

## 2022-04-13 DIAGNOSIS — M25551 Pain in right hip: Secondary | ICD-10-CM | POA: Diagnosis not present

## 2022-04-24 NOTE — Patient Instructions (Signed)
[name]  04/24/2022     '@PREFPERIOPPHARMACY'$ @   Your procedure is scheduled on  04/30/2022.   Report to [name] at  1115 A.M.   Call this number if you have problems the morning of surgery:  [phone]   Remember:  Follow the diet and prep instructions given to you by the office.      Use your inhaler before you come and bring your inhaler with you.           Take these medicines the morning of surgery with A SIP OF WATER                       Norvasc, celebrex, xyzal, paxil, aciphex, topamax.     Do not wear jewelry, make-up or nail polish.  Do not wear lotions, powders, or perfumes, or deodorant.  Do not shave 48 hours prior to surgery.  Men may shave face and neck.  Do not bring valuables to the hospital.  [facility] is not responsible for any belongings or valuables.  Contacts, dentures or bridgework may not be worn into surgery.  Leave your suitcase in the car.  After surgery it may be brought to your room.  For patients admitted to the hospital, discharge time will be determined by your treatment team.  Patients discharged the day of surgery will not be allowed to drive home and must have someone with them for 24 hours.    Special instructions:   DO NOT smoke tobacco or vape for 24 hours before your procedure.   Please read over the following fact sheets that you were given. Anesthesia Post-op Instructions and Care and Recovery After Surgery      Colonoscopy, Adult, Care After The following information offers guidance on how to care for yourself after your procedure. Your health care provider may also give you more specific instructions. If you have problems or questions, contact your health care provider. What can I expect after the procedure? After the procedure, it is common to have: A small amount of blood in your stool for 24 hours after the procedure. Some gas. Mild cramping or bloating of your abdomen. Follow these  instructions at home: Eating and drinking  Drink enough fluid to keep your urine pale yellow. Follow instructions from your health care provider about eating or drinking restrictions. Resume your normal diet as told by your health care provider. Avoid heavy or fried foods that are hard to digest. Activity Rest as told by your health care provider. Avoid sitting for a long time without moving. Get up to take short walks every 1-2 hours. This is important to improve blood flow and breathing. Ask for help if you feel weak or unsteady. Return to your normal activities as told by your health care provider. Ask your health care provider what activities are safe for you. Managing cramping and bloating  Try walking around when you have cramps or feel bloated. If directed, apply heat to your abdomen as told by your health care provider. Use the heat source that your health care provider recommends, such as a moist heat pack or a heating pad. Place a towel between your skin and the heat source. Leave the heat on for 20-30 minutes. Remove the heat if your skin turns bright red. This is especially important if you are unable to feel pain, heat, or cold. You have a greater risk of getting burned. General instructions If  you were given a sedative during the procedure, it can affect you for several hours. Do not drive or operate machinery until your health care provider says that it is safe. For the first 24 hours after the procedure: Do not sign important documents. Do not drink alcohol. Do your regular daily activities at a slower pace than normal. Eat soft foods that are easy to digest. Take over-the-counter and prescription medicines only as told by your health care provider. Keep all follow-up visits. This is important. Contact a health care provider if: You have blood in your stool 2-3 days after the procedure. Get help right away if: You have more than a small spotting of blood in your  stool. You have large blood clots in your stool. You have swelling of your abdomen. You have nausea or vomiting. You have a fever. You have increasing pain in your abdomen that is not relieved with medicine. These symptoms may be an emergency. Get help right away. Call 911. Do not wait to see if the symptoms will go away. Do not drive yourself to the hospital. Summary After the procedure, it is common to have a small amount of blood in your stool. You may also have mild cramping and bloating of your abdomen. If you were given a sedative during the procedure, it can affect you for several hours. Do not drive or operate machinery until your health care provider says that it is safe. Get help right away if you have a lot of blood in your stool, nausea or vomiting, a fever, or increased pain in your abdomen. This information is not intended to replace advice given to you by your health care provider. Make sure you discuss any questions you have with your health care provider. Document Revised: 03/29/2021 Document Reviewed: 03/29/2021 Elsevier Patient Education  [place] After This sheet gives you information about how to care for yourself after your procedure. Your health care provider may also give you more specific instructions. If you have problems or questions, contact your health care provider. What can I expect after the procedure? After the procedure, it is common to have: Tiredness. Forgetfulness about what happened after the procedure. Impaired judgment for important decisions. Nausea or vomiting. Some difficulty with balance. Follow these instructions at home: For the time period you were told by your health care provider:     Rest as needed. Do not participate in activities where you could fall or become injured. Do not drive or use machinery. Do not drink alcohol. Do not take sleeping pills or medicines that cause drowsiness. Do  not make important decisions or sign legal documents. Do not take care of children on your own. Eating and drinking Follow the diet that is recommended by your health care provider. Drink enough fluid to keep your urine pale yellow. If you vomit: Drink water, juice, or soup when you can drink without vomiting. Make sure you have little or no nausea before eating solid foods. General instructions Have a responsible adult stay with you for the time you are told. It is important to have someone help care for you until you are awake and alert. Take over-the-counter and prescription medicines only as told by your health care provider. If you have sleep apnea, surgery and certain medicines can increase your risk for breathing problems. Follow instructions from your health care provider about wearing your sleep device: Anytime you are sleeping, including during daytime naps. While taking prescription pain medicines,  sleeping medicines, or medicines that make you drowsy. Avoid smoking. Keep all follow-up visits as told by your health care provider. This is important. Contact a health care provider if: You keep feeling nauseous or you keep vomiting. You feel light-headed. You are still sleepy or having trouble with balance after 24 hours. You develop a rash. You have a fever. You have redness or swelling around the IV site. Get help right away if: You have trouble breathing. You have new-onset confusion at home. Summary For several hours after your procedure, you may feel tired. You may also be forgetful and have poor judgment. Have a responsible adult stay with you for the time you are told. It is important to have someone help care for you until you are awake and alert. Rest as told. Do not drive or operate machinery. Do not drink alcohol or take sleeping pills. Get help right away if you have trouble breathing, or if you suddenly become confused. This information is not intended to replace  advice given to you by your health care provider. Make sure you discuss any questions you have with your health care provider. Document Revised: 07/11/2021 Document Reviewed: 07/09/2019 Elsevier Patient Education  [place].

## 2022-04-25 ENCOUNTER — Encounter (HOSPITAL_COMMUNITY)
Admission: RE | Admit: 2022-04-25 | Discharge: 2022-04-25 | Disposition: A | Discharge status: Home / Self Care | Payer: Medicare Other | Source: Ambulatory Visit | Attending: Internal Medicine | Admitting: Internal Medicine

## 2022-04-25 ENCOUNTER — Encounter (HOSPITAL_COMMUNITY): Payer: Self-pay

## 2022-04-25 ENCOUNTER — Other Ambulatory Visit (HOSPITAL_COMMUNITY): Payer: Self-pay | Admitting: Adult Health

## 2022-04-25 VITALS — BP 152/75 | HR 69 | Temp 97.6°F | Resp 18 | Ht 67.0 in | Wt 178.8 lb

## 2022-04-25 DIAGNOSIS — Z01810 Encounter for preprocedural cardiovascular examination: Secondary | ICD-10-CM | POA: Insufficient documentation

## 2022-04-25 DIAGNOSIS — I10 Essential (primary) hypertension: Secondary | ICD-10-CM | POA: Insufficient documentation

## 2022-04-25 DIAGNOSIS — Z1231 Encounter for screening mammogram for malignant neoplasm of breast: Secondary | ICD-10-CM

## 2022-04-27 ENCOUNTER — Encounter: Payer: Self-pay | Admitting: Allergy & Immunology

## 2022-04-27 ENCOUNTER — Ambulatory Visit: Payer: Medicare Other | Admitting: Allergy & Immunology

## 2022-04-27 VITALS — BP 136/70 | HR 73 | Temp 100.1°F | Resp 20 | Ht 68.0 in | Wt 171.0 lb

## 2022-04-27 DIAGNOSIS — T7800XD Anaphylactic reaction due to unspecified food, subsequent encounter: Secondary | ICD-10-CM | POA: Diagnosis not present

## 2022-04-27 DIAGNOSIS — J310 Chronic rhinitis: Secondary | ICD-10-CM

## 2022-04-27 DIAGNOSIS — J4540 Moderate persistent asthma, uncomplicated: Secondary | ICD-10-CM

## 2022-04-27 DIAGNOSIS — L299 Pruritus, unspecified: Secondary | ICD-10-CM | POA: Diagnosis not present
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
## 2022-04-27 NOTE — Patient Instructions (Addendum)
1. Itching - We are going to get some labs. - We cannot do testing since [employer] is not allowing. - We will schedule you in the future.  - We will try to work on finding a cause of this.   2. Moderate persistent asthma, uncomplicated - Lung testing looked phenomenal. - We are not going to make any changes at this time. - Daily controller medication(s): Symbicort 160/4.59mg two puffs twice daily with spacer - Prior to physical activity: albuterol 2 puffs 10-15 minutes before physical activity. - Rescue medications: albuterol 4 puffs every 4-6 hours as needed - Asthma control goals:  * Full participation in all desired activities (may need albuterol before activity) * Albuterol use two time or less a week on average (not counting use with activity) * Cough interfering with sleep two time or less a month * Oral steroids no more than once a year * No hospitalizations  3. Chronic rhinitis - We will do skin testing in the next 1-2 weeks. - We can figure out a plan from there. - I am sorry that [employer]   4. Anaphylactic shock due to food, subsequent encounter - Food allergy sheet given. - Circles foods that you are worried about and we can do testing at the next visit.  5. Return in about 1 week (around 05/04/2022) for testing on [facility] GSynthia Inn[facility]entschedule [facility]  Please inform u[place] any Emergency Department visits, hospitalizations, or changes in symptoms. Call uKoreabefore going to the ED for breathing or allergy symptoms since we might be able to fit you in for a sick visit. Feel free to contact u[place]oreaanytime with any questions, problems, or concerns.  It was a pleasure to meet you today! You are a hot mess!   Websites that have reliable patient information: 1. American Academy of Asthma, Allergy, and Immunology: www.aaaai.org 2. Food Allergy Research and Education (FARE): foodallergy.org 3. Mothers of Asthmatics: http://www.asthmacommunitynetwork.org 4.  American College of Allergy, Asthma, and Immunology: www.acaai.org   COVID-19 Vaccine Information can be found at: [email or web address] questions related to vaccine distribution or appointments, please email [email or web address] or call [phone]   We realize that you might be concerned about having an allergic reaction to the COVID19 vaccines. To help with that concern, WE ARE OFFERING THE COVID19 VACCINES IN OUR OFFICE! Ask the front desk for dates!     "Like" u[place]on Facebook and Instagram for our latest updates!      A healthy democracy works best when ANew York Life Insuranceparticipate! Make sure you are registered to vote! If you have moved or changed any of your contact information, you will need to get this updated before voting!  In some cases, you MAY be able to register to vote online: [email or web address]

## 2022-04-29 ENCOUNTER — Encounter: Payer: Self-pay | Admitting: Allergy & Immunology

## 2022-04-30 ENCOUNTER — Other Ambulatory Visit: Payer: Self-pay

## 2022-04-30 ENCOUNTER — Ambulatory Visit (HOSPITAL_BASED_OUTPATIENT_CLINIC_OR_DEPARTMENT_OTHER): Payer: Medicare Other | Admitting: Anesthesiology

## 2022-04-30 ENCOUNTER — Encounter (HOSPITAL_COMMUNITY): Payer: Self-pay

## 2022-04-30 ENCOUNTER — Encounter (HOSPITAL_COMMUNITY)
Admission: RE | Disposition: A | Discharge status: Home / Self Care | Payer: Self-pay | Source: Ambulatory Visit | Attending: Internal Medicine

## 2022-04-30 ENCOUNTER — Ambulatory Visit (HOSPITAL_COMMUNITY)
Admission: RE | Admit: 2022-04-30 | Discharge: 2022-04-30 | Disposition: A | Discharge status: Home / Self Care | Payer: Medicare Other | Source: Ambulatory Visit | Attending: Internal Medicine | Admitting: Internal Medicine

## 2022-04-30 ENCOUNTER — Ambulatory Visit (HOSPITAL_COMMUNITY): Payer: Medicare Other | Admitting: Anesthesiology

## 2022-04-30 DIAGNOSIS — Z1211 Encounter for screening for malignant neoplasm of colon: Secondary | ICD-10-CM | POA: Diagnosis not present

## 2022-04-30 DIAGNOSIS — K648 Other hemorrhoids: Secondary | ICD-10-CM | POA: Insufficient documentation

## 2022-04-30 DIAGNOSIS — L509 Urticaria, unspecified: Secondary | ICD-10-CM | POA: Diagnosis not present

## 2022-04-30 DIAGNOSIS — K222 Esophageal obstruction: Secondary | ICD-10-CM | POA: Insufficient documentation

## 2022-04-30 DIAGNOSIS — Z79899 Other long term (current) drug therapy: Secondary | ICD-10-CM | POA: Diagnosis not present

## 2022-04-30 DIAGNOSIS — M797 Fibromyalgia: Secondary | ICD-10-CM | POA: Diagnosis not present

## 2022-04-30 DIAGNOSIS — Z8673 Personal history of transient ischemic attack (TIA), and cerebral infarction without residual deficits: Secondary | ICD-10-CM | POA: Insufficient documentation

## 2022-04-30 DIAGNOSIS — J45909 Unspecified asthma, uncomplicated: Secondary | ICD-10-CM | POA: Diagnosis not present

## 2022-04-30 DIAGNOSIS — Z87891 Personal history of nicotine dependence: Secondary | ICD-10-CM | POA: Insufficient documentation

## 2022-04-30 DIAGNOSIS — I10 Essential (primary) hypertension: Secondary | ICD-10-CM

## 2022-04-30 DIAGNOSIS — K219 Gastro-esophageal reflux disease without esophagitis: Secondary | ICD-10-CM | POA: Insufficient documentation

## 2022-04-30 DIAGNOSIS — K449 Diaphragmatic hernia without obstruction or gangrene: Secondary | ICD-10-CM | POA: Insufficient documentation

## 2022-04-30 DIAGNOSIS — L299 Pruritus, unspecified: Secondary | ICD-10-CM | POA: Diagnosis not present

## 2022-04-30 DIAGNOSIS — Z1212 Encounter for screening for malignant neoplasm of rectum: Secondary | ICD-10-CM

## 2022-04-30 DIAGNOSIS — K5900 Constipation, unspecified: Secondary | ICD-10-CM | POA: Diagnosis not present

## 2022-04-30 DIAGNOSIS — Z86010 Personal history of colonic polyps: Secondary | ICD-10-CM

## 2022-04-30 HISTORY — PX: COLONOSCOPY WITH PROPOFOL: SHX5780

## 2022-04-30 SURGERY — COLONOSCOPY WITH PROPOFOL
Anesthesia: General

## 2022-04-30 MED ORDER — LACTATED RINGERS IV SOLN: INTRAVENOUS | Status: DC

## 2022-04-30 MED ORDER — PROPOFOL 10 MG/ML IV BOLUS
INTRAVENOUS | Status: DC | PRN
  Administered 2022-04-30: 50 mg via INTRAVENOUS
  Administered 2022-04-30: 100 mg via INTRAVENOUS
  Administered 2022-04-30: 50 mg via INTRAVENOUS

## 2022-04-30 NOTE — Anesthesia Postprocedure Evaluation (Signed)
Anesthesia Post Note  Patient: [name]  Procedure(s) Performed: COLONOSCOPY WITH PROPOFOL  Patient location during evaluation: PACU Anesthesia Type: General Level of consciousness: awake and alert and oriented Pain management: pain level controlled Vital Signs Assessment: post-procedure vital signs reviewed and stable Respiratory status: spontaneous breathing, nonlabored ventilation and respiratory function stable Cardiovascular status: blood pressure returned to baseline and stable Postop Assessment: no apparent nausea or vomiting Anesthetic complications: no   No notable events documented.   Last Vitals:  Vitals:   04/30/22 1050 04/30/22 1156  BP: (!) 163/90 (!) 110/56  Pulse: 75 69  Resp: 18 14  Temp: 37.1 C 36.8 C  SpO2: 99% 100%    Last Pain:  Vitals:   04/30/22 1206  TempSrc:   PainSc: 0-No pain                 Gwynevere Lizana C Talani Brazee

## 2022-04-30 NOTE — Interval H&P Note (Signed)
History and Physical Interval Note:  04/30/2022 11:32 AM  [name]  has presented today for surgery, with the diagnosis of HX: colon polyps, constipation.  The various methods of treatment have been discussed with the patient and family. After consideration of risks, benefits and other options for treatment, the patient has consented to  Procedure(s) with comments: COLONOSCOPY WITH PROPOFOL (N/A) - 1:15 pm as a surgical intervention.  The patient's history has been reviewed, patient examined, no change in status, stable for surgery.  I have reviewed the patient's chart and labs.  Questions were answered to the patient's satisfaction.     Eloise Harman

## 2022-04-30 NOTE — Anesthesia Preprocedure Evaluation (Signed)
Anesthesia Evaluation  Patient identified by MRN, date of birth, ID band Patient awake    Reviewed: Allergy & Precautions, NPO status , Patient's Chart, lab work & pertinent test results  Airway Mallampati: II  TM Distance: >3 FB Neck ROM: Full    Dental  (+) Dental Advisory Given   Pulmonary asthma , former smoker,    Pulmonary exam normal breath sounds clear to auscultation       Cardiovascular hypertension, Pt. on medications Normal cardiovascular exam Rhythm:Regular Rate:Normal     Neuro/Psych PSYCHIATRIC DISORDERS Anxiety Depression  Neuromuscular disease CVA (right vision loss), Residual Symptoms    GI/Hepatic Neg liver ROS, GERD  Medicated and Controlled,  Endo/Other  negative endocrine ROS  Renal/GU negative Renal ROS  negative genitourinary   Musculoskeletal  (+) Arthritis , Osteoarthritis,  Fibromyalgia -  Abdominal   Peds negative pediatric ROS (+)  Hematology negative hematology ROS (+)   Anesthesia Other Findings   Reproductive/Obstetrics negative OB ROS                             Anesthesia Physical Anesthesia Plan  ASA: 3  Anesthesia Plan: General   Post-op Pain Management: Minimal or no pain anticipated   Induction: Intravenous  PONV Risk Score and Plan: Propofol infusion  Airway Management Planned: Nasal Cannula and Natural Airway  Additional Equipment:   Intra-op Plan:   Post-operative Plan:   Informed Consent: I have reviewed the patients History and Physical, chart, labs and discussed the procedure including the risks, benefits and alternatives for the proposed anesthesia with the patient or authorized representative who has indicated his/her understanding and acceptance.     Dental advisory given  Plan Discussed with: CRNA and Surgeon  Anesthesia Plan Comments:         Anesthesia Quick Evaluation

## 2022-04-30 NOTE — Transfer of Care (Signed)
Immediate Anesthesia Transfer of Care Note  Patient: [name]  Procedure(s) Performed: COLONOSCOPY WITH PROPOFOL  Patient Location: Short Stay  Anesthesia Type:General  Level of Consciousness: awake, alert  and oriented  Airway & Oxygen Therapy: Patient Spontanous Breathing  Post-op Assessment: Report given to RN and Post -op Vital signs reviewed and stable  Post vital signs: Reviewed and stable  Last Vitals:  Vitals Value Taken Time  BP 110/56 04/30/22 1156  Temp 36.8 C 04/30/22 1156  Pulse 69 04/30/22 1156  Resp 14 04/30/22 1156  SpO2 100 % 04/30/22 1156    Last Pain:  Vitals:   04/30/22 1156  TempSrc: Oral  PainSc: 0-No pain      Patients Stated Pain Goal: 8 (93/79/02 4097)  Complications: No notable events documented.

## 2022-04-30 NOTE — Op Note (Signed)
[facility] Patient Name: [name] Procedure Date: 04/30/2022 11:28 AM MRN: [id number] Date of Birth: December 02, 1955 Attending MD: Elon Alas. Abbey Chatters DO CSN: [id number] Age: 66 Admit Type: Outpatient Procedure:                Colonoscopy Indications:              Screening for colorectal malignant neoplasm Providers:                Elon Alas. Abbey Chatters, DO, Caprice Kluver, [place]                            Risa Grill, Technician Referring MD:              Medicines:                See the Anesthesia note for documentation of the                            administered medications Complications:            No immediate complications. Estimated Blood Loss:     Estimated blood loss: none. Procedure:                Pre-Anesthesia Assessment:                           - The anesthesia plan was to use monitored                            anesthesia care (MAC).                           After obtaining informed consent, the colonoscope                            was passed under direct vision. Throughout the                            procedure, the patient's blood pressure, pulse, and                            oxygen saturations were monitored continuously. The                            PCF-HQ190L ([id number]) scope was introduced through                            the anus and advanced to the the cecum, identified                            by appendiceal orifice and ileocecal valve. The                            colonoscopy was performed without difficulty. The                            patient tolerated the procedure well. The  quality                            of the bowel preparation was evaluated using the                            BBPS [facility] Bowel Preparation Scale) with scores                            of: Right Colon = 1 (portion of mucosa seen, but                            other areas not well seen due to staining, residual                            stool and/or  opaque liquid), Transverse Colon = 2                            (minor amount of residual staining, small fragments                            of stool and/or opaque liquid, but mucosa seen                            well) and Left Colon = 2 (minor amount of residual                            staining, small fragments of stool and/or opaque                            liquid, but mucosa seen well). The total BBPS score                            equals 5. The quality of the bowel preparation was                            inadequate. Scope In: 11:42:54 AM Scope Out: 11:50:56 AM Scope Withdrawal Time: 0 hours 4 minutes 41 seconds  Total Procedure Duration: 0 hours 8 minutes 2 seconds  Findings:      The perianal and digital rectal examinations were normal.      Non-bleeding internal hemorrhoids were found during endoscopy.      A large amount of semi-solid stool was found in the transverse colon, in       the ascending colon and in the cecum, precluding visualization. Lavage       of the area was performed using copious amounts of sterile water,       resulting in incomplete clearance with continued poor visualization. Impression:               - Preparation of the colon was inadequate.                           - Non-bleeding internal hemorrhoids.                           -  Stool in the transverse colon, in the ascending                            colon and in the cecum.                           - No specimens collected. Moderate Sedation:      Per Anesthesia Care Recommendation:           - Patient has a contact number available for                            emergencies. The signs and symptoms of potential                            delayed complications were discussed with the                            patient. Return to normal activities tomorrow.                            Written discharge instructions were provided to the                            patient.                            - Resume previous diet.                           - Continue present medications.                           - Repeat colonoscopy in 6 months because the bowel                            preparation was suboptimal.                           - Return to GI clinic after studies are complete. Procedure Code(s):        --- Professional ---                           Z6109, Colorectal cancer screening; colonoscopy on                            individual not meeting criteria for high risk Diagnosis Code(s):        --- Professional ---                           Z12.11, Encounter for screening for malignant                            neoplasm of colon  K64.8, Other hemorrhoids CPT copyright 2019 American Medical Association. All rights reserved. The codes documented in this report are preliminary and upon coder review may  be revised to meet current compliance requirements. Elon Alas. Abbey Chatters, DO [place] Abbey Chatters, DO 04/30/2022 11:54:29 AM This report has been signed electronically. Number of Addenda: 0

## 2022-04-30 NOTE — Discharge Instructions (Signed)
  Colonoscopy Discharge Instructions  Read the instructions outlined below and refer to this sheet in the next few weeks. These discharge instructions provide you with general information on caring for yourself after you leave the hospital. Your doctor may also give you specific instructions. While your treatment has been planned according to the most current medical practices available, unavoidable complications occasionally occur.   ACTIVITY You may resume your regular activity, but move at a slower pace for the next 24 hours.  Take frequent rest periods for the next 24 hours.  Walking will help get rid of the air and reduce the bloated feeling in your belly (abdomen).  No driving for 24 hours (because of the medicine (anesthesia) used during the test).   Do not sign any important legal documents or operate any machinery for 24 hours (because of the anesthesia used during the test).  NUTRITION Drink plenty of fluids.  You may resume your normal diet as instructed by your doctor.  Begin with a light meal and progress to your normal diet. Heavy or fried foods are harder to digest and may make you feel sick to your stomach (nauseated).  Avoid alcoholic beverages for 24 hours or as instructed.  MEDICATIONS You may resume your normal medications unless your doctor tells you otherwise.  WHAT YOU CAN EXPECT TODAY Some feelings of bloating in the abdomen.  Passage of more gas than usual.  Spotting of blood in your stool or on the toilet paper.  IF YOU HAD POLYPS REMOVED DURING THE COLONOSCOPY: No aspirin products for 7 days or as instructed.  No alcohol for 7 days or as instructed.  Eat a soft diet for the next 24 hours.  FINDING OUT THE RESULTS OF YOUR TEST Not all test results are available during your visit. If your test results are not back during the visit, make an appointment with your caregiver to find out the results. Do not assume everything is normal if you have not heard from your  caregiver or the medical facility. It is important for you to follow up on all of your test results.  SEEK IMMEDIATE MEDICAL ATTENTION IF: You have more than a spotting of blood in your stool.  Your belly is swollen (abdominal distention).  You are nauseated or vomiting.  You have a temperature over 101.  You have abdominal pain or discomfort that is severe or gets worse throughout the day.   Unfortunately, your colon was not adequately prepped today for colonoscopy.  I did not see any evidence of colon cancer or large polyps, but certainly could have missed smaller polyps due to poor visualization.  Would recommend repeat colonoscopy in 6 months with a different colon prep.   I hope you have a great rest of your week!  Askari Kinley K. Rogerick Baldwin, D.O. Gastroenterology and Hepatology [facility]  

## 2022-05-02 LAB — ALPHA-GAL PANEL
Allergen Lamb IgE: 0.16 kU/L — AB
Beef IgE: 0.25 kU/L — AB
IgE (Immunoglobulin E), Serum: 33 IU/mL (ref 6–495)
O215-IgE Alpha-Gal: 0.41 kU/L — AB
Pork IgE: 0.16 kU/L — AB

## 2022-05-04 LAB — THYROID ANTIBODIES
Thyroglobulin Antibody: <1 IU/mL (ref 0.0–0.9)
Thyroperoxidase Ab SerPl-aCnc: 33 IU/mL (ref 0–34)

## 2022-05-04 LAB — CMP14+EGFR
ALT: 18 IU/L (ref 0–32)
AST: 22 IU/L (ref 0–40)
Albumin/Globulin Ratio: 2 (ref 1.2–2.2)
Albumin: 4.5 g/dL (ref 3.9–4.9)
Alkaline Phosphatase: 64 IU/L (ref 44–121)
BUN/Creatinine Ratio: 8 — ABNORMAL LOW (ref 12–28)
BUN: 9 mg/dL (ref 8–27)
Bilirubin Total: 0.4 mg/dL (ref 0.0–1.2)
CO2: 17 mmol/L — ABNORMAL LOW (ref 20–29)
Calcium: 9.5 mg/dL (ref 8.7–10.3)
Chloride: 107 mmol/L — ABNORMAL HIGH (ref 96–106)
Creatinine, Ser: 1.09 mg/dL — ABNORMAL HIGH (ref 0.57–1.00)
Globulin, Total: 2.2 g/dL (ref 1.5–4.5)
Glucose: 99 mg/dL (ref 70–99)
Potassium: 4.2 mmol/L (ref 3.5–5.2)
Sodium: 141 mmol/L (ref 134–144)
Total Protein: 6.7 g/dL (ref 6.0–8.5)
eGFR: 56 mL/min/{1.73_m2} — ABNORMAL LOW (ref >59)

## 2022-05-04 LAB — CBC WITH DIFFERENTIAL
Basophils Absolute: 0.1 10*3/uL (ref 0.0–0.2)
Basos: 1 %
EOS (ABSOLUTE): 0.2 10*3/uL (ref 0.0–0.4)
Eos: 2 %
Hematocrit: 40.9 % (ref 34.0–46.6)
Hemoglobin: 13.9 g/dL (ref 11.1–15.9)
Immature Grans (Abs): 0 10*3/uL (ref 0.0–0.1)
Immature Granulocytes: 0 %
Lymphocytes Absolute: 1.4 10*3/uL (ref 0.7–3.1)
Lymphs: 18 %
MCH: 29.8 pg (ref 26.6–33.0)
MCHC: 34 g/dL (ref 31.5–35.7)
MCV: 88 fL (ref 79–97)
Monocytes Absolute: 0.4 10*3/uL (ref 0.1–0.9)
Monocytes: 6 %
Neutrophils Absolute: 5.5 10*3/uL (ref 1.4–7.0)
Neutrophils: 73 %
RBC: 4.67 x10E6/uL (ref 3.77–5.28)
RDW: 12.7 % (ref 11.7–15.4)
WBC: 7.5 10*3/uL (ref 3.4–10.8)

## 2022-05-04 LAB — SEDIMENTATION RATE: Sed Rate: 5 mm/hr (ref 0–40)

## 2022-05-04 LAB — C-REACTIVE PROTEIN: CRP: <1 mg/L (ref 0–10)

## 2022-05-04 LAB — ANTINUCLEAR ANTIBODIES, IFA: ANA Titer 1: POSITIVE — AB

## 2022-05-04 LAB — FANA STAINING PATTERNS: Homogeneous Pattern: 1:80 {titer}

## 2022-05-04 LAB — TRYPTASE: Tryptase: 6.6 ug/L (ref 2.2–13.2)

## 2022-05-07 ENCOUNTER — Encounter (HOSPITAL_COMMUNITY): Payer: Self-pay | Admitting: Internal Medicine

## 2022-05-08 NOTE — Patient Instructions (Incomplete)
1. Itching -   2. Moderate persistent asthma, uncomplicated - Daily controller medication(s): Symbicort 160/4.106mg two puffs twice daily with spacer - Prior to physical activity: albuterol 2 puffs 10-15 minutes before physical activity. - Rescue medications: albuterol 4 puffs every 4-6 hours as needed - Asthma control goals:  * Full participation in all desired activities (may need albuterol before activity) * Albuterol use two time or less a week on average (not counting use with activity) * Cough interfering with sleep two time or less a month * Oral steroids no more than once a year * No hospitalizations  3. Chronic rhinitis   4. Anaphylactic shock due to food, subsequent encounter   5. Schedule a follow up appointment in months

## 2022-05-09 ENCOUNTER — Other Ambulatory Visit: Payer: Self-pay

## 2022-05-09 ENCOUNTER — Ambulatory Visit: Payer: Medicare Other | Admitting: Family

## 2022-05-09 ENCOUNTER — Encounter: Payer: Self-pay | Admitting: Family

## 2022-05-09 VITALS — BP 122/76 | Temp 98.0°F | Resp 18 | Ht 68.0 in

## 2022-05-09 DIAGNOSIS — T7800XD Anaphylactic reaction due to unspecified food, subsequent encounter: Secondary | ICD-10-CM

## 2022-05-09 DIAGNOSIS — J4540 Moderate persistent asthma, uncomplicated: Secondary | ICD-10-CM | POA: Diagnosis not present

## 2022-05-09 DIAGNOSIS — T7800XA Anaphylactic reaction due to unspecified food, initial encounter: Secondary | ICD-10-CM

## 2022-05-09 DIAGNOSIS — L299 Pruritus, unspecified: Secondary | ICD-10-CM

## 2022-05-09 DIAGNOSIS — J3089 Other allergic rhinitis: Secondary | ICD-10-CM

## 2022-05-09 DIAGNOSIS — J302 Other seasonal allergic rhinitis: Secondary | ICD-10-CM

## 2022-05-09 NOTE — Addendum Note (Signed)
Addended by: Larence Penning on: 05/09/2022 05:19 PM   Modules accepted: Orders

## 2022-05-09 NOTE — Progress Notes (Signed)
[address], [address] [place] [address] Dept: [phone]  FOLLOW UP NOTE  Patient ID: [name], female    DOB: 06-07-1956  Age: 66 y.o. MRN: [id number] Date of Office Visit: 05/09/2022  Assessment  Chief Complaint: Asthma (No issues ) and Allergy Testing (Was using nivea body lotion and olay )  HPI [name] is a 66 year old female who presents today for skin testing to environmental allergens and select foods.  She was last seen on April 27, 2022 by Dr. Ernst Bowler for itching, moderate persistent asthma, chronic rhinitis, and anaphylactic shock due to food.  Since her last office visit she denies any new diagnosis or surgeries.  Itching: Itching is reported as being better.  She has not had any itching probably in the past 3 weeks.  She has been avoiding all red meat due to her elevated alpha gal lab work.  She has also changed to All detergent and quit Nivea lotion and is now using vitamin E oil.  She has also stopped using Olay soap and is now using [employer].  She is not certain what helped stop the itching.  Moderate persistent asthma: She continues to take Symbicort 160/4.5 mcg 2 puffs a day without a spacer and albuterol as needed.  She has not had to use her albuterol since we last saw her.  She does not want a spacer to use for her Symbicort because she has been doing her inhaler forever that way.  Discussed the benefits of getting the Symbicort down in her lungs rather than some of the medication hitting the back of her throat and she is still not interested.  She reports a cough due to having to clear her throat.  The cough is nonproductive.  She denies wheezing, tightness in her chest, shortness of breath, and nocturnal awakenings due to breathing problems.  She has not required any systemic steroids since her last office visit.  She has not had to use her albuterol inhaler since we last saw her.  She has also not made any trips to the emergency room  or urgent care due to breathing problems.  Chronic rhinitis: She reports clear rhinorrhea and postnasal drip.  She denies nasal congestion.  She has not had any sinus infections since we last saw her.  She does have 2 cats that live in the house with her and sleep in her bedroom.  She reports that they do not cause her any allergy symptoms.  For her allergies she is currently taking Mucinex twice a day.  She was previously on levocetirizine by her primary care physician, but stopped this medication 3 days prior to today's appointment.  She does not use any nose sprays.  When she did use the levocetirizine she thought that it did help.  Anaphylactic shock due to food: She reports that she cannot eat home grown tomatoes because they cause her to develop hives, but she can eat tomatoes that are not home grown.  In the past she ate a salad from [facility] and had throat swelling.  She has eaten this salad since then without any problems.  She also mentions that she bought a mixed solid with cabbage and almost made her use her EpiPen.  She had hives and took Benadryl.  The salad contained purple cabbage, regular cabbage, salsa, and onions.  She is able to eat cooked cabbage without any problems.  When asked if she is currently avoiding tree nuts, lettuce, cranberries, pumpkin seeds and  sesame she reports that she is able to eat lettuce.   Drug Allergies:  No Known Allergies  Review of Systems: Review of Systems  Constitutional:  Negative for chills and fever.  HENT:         Reports clear rhinorrhea and postnasal drip.  Denies nasal Congestion she also reports a sensation of something being stuck in her throat feeling of a furball in her throat.  She has seen ear nose and throat and was told that they did not see anything.  She thinks that she needs to go back to GI  Eyes:        Denies itchy watery eyes  Respiratory:  Positive for cough. Negative for shortness of breath and wheezing.        Reports daily  cough that she feels is due to clearing her throat.  Cough is no worse since her last office visit.  She denies wheezing, tightness in her chest, shortness of breath, and nocturnal awakenings due to breathing problems  Cardiovascular:  Negative for chest pain and palpitations.  Gastrointestinal:        Reports reflux symptoms despite being on medication  Genitourinary:  Negative for frequency.  Skin:  Negative for itching and rash.  Neurological:  Positive for headaches.       Currently taking Topamax for migraines     Physical Exam: BP 122/76   Temp 98 F (36.7 C)   Resp 18   Ht '5\' 8"'$  (1.727 m)   LMP 12/13/2015   BMI 26.00 kg/m    Physical Exam Constitutional:      Appearance: Normal appearance.  HENT:     Head: Normocephalic and atraumatic.     Comments: Pharynx normal, eyes normal, ears normal, nose normal    Right Ear: Tympanic membrane, ear canal and external ear normal.     Left Ear: Tympanic membrane, ear canal and external ear normal.     Nose: Nose normal.     Mouth/Throat:     Mouth: Mucous membranes are moist.     Pharynx: Oropharynx is clear.  Eyes:     Conjunctiva/sclera: Conjunctivae normal.  Cardiovascular:     Rate and Rhythm: Regular rhythm.     Heart sounds: Normal heart sounds.  Pulmonary:     Effort: Pulmonary effort is normal.     Breath sounds: Normal breath sounds.     Comments: Lungs clear to auscultation Musculoskeletal:     Cervical back: Neck supple.  Skin:    General: Skin is warm.  Neurological:     Mental Status: She is alert and oriented to person, place, and time.  Psychiatric:        Mood and Affect: Mood normal.        Behavior: Behavior normal.        Thought Content: Thought content normal.        Judgment: Judgment normal.     Diagnostics: percutaneous skin testing today was positive to grass pollen, 1 weed pollen, cat, and horse a good histamine response.  Skin testing was also  to pistachio, she cannot remember when she  last had a pistachio.   Airborne Adult Perc - 05/09/22 0951     Time Antigen Placed 5809    Allergen Manufacturer Lavella Hammock    Location Back    Number of Test 59    1. Control-Buffer 50% Glycerol Negative    2. Control-Histamine 1 mg/ml 2+    3. Albumin saline Negative  4. Weldon Negative    5. [place] Negative    6. Johnson Negative    7. Kentucky Blue 2+    8. Meadow Fescue Negative    9. Perennial Rye Negative    10. Sweet Vernal 2+    11. Timothy 2+    12. Cocklebur Negative    13. Burweed Marshelder Negative    14. Ragweed, short Negative    15. Ragweed, Giant Negative    16. Plantain,  English 2+    17. Lamb's Quarters Negative    18. Sheep Sorrell Negative    19. Rough Pigweed Negative    20. Marsh Elder, Rough Negative    21. Mugwort, Common Negative    22. Ash mix Negative    23. Birch mix Negative    24. Beech American Negative    25. Box, Elder Negative    26. Cedar, red Negative    27. Cottonwood, [place] Negative    28. Elm mix Negative    29. Hickory Negative    30. Maple mix Negative    31. Oak, [place] mix Negative    32. Pecan Pollen Negative    33. Pine mix Negative    34. Sycamore Eastern Negative    35. Bellflower, Black Pollen Negative    36. Alternaria alternata Negative    37. Cladosporium Herbarum Negative    38. Aspergillus mix Negative    39. Penicillium mix Negative    40. Bipolaris sorokiniana (Helminthosporium) Negative    41. Drechslera spicifera (Curvularia) Negative    42. Mucor plumbeus Negative    43. Fusarium moniliforme Negative    44. Aureobasidium pullulans (pullulara) Negative    45. Rhizopus oryzae Negative    46. Botrytis cinera Negative    47. Epicoccum nigrum Negative    48. Phoma betae Negative    49. Candida Albicans Negative    50. Trichophyton mentagrophytes Negative    51. Mite, D Farinae  5,000 AU/ml Negative    52. Mite, D Pteronyssinus  5,000 AU/ml Negative    53. Cat Hair 10,000 BAU/ml 3+    54.  Dog Epithelia  Negative    55. Mixed Feathers Negative    56. Horse Epithelia 3+    57. Cockroach, German Negative    58. Mouse Negative    59. Tobacco Leaf Negative             Food Adult Perc - 05/09/22 0900     Time Antigen Placed 9983    Allergen Manufacturer Lavella Hammock    Location Back    1. Peanut Negative    2. Soybean Negative    3. Wheat Negative    4. Sesame Negative    7. Casein Negative    8. Shellfish Mix Negative    9. Fish Mix Negative    10. Cashew Negative    11. Pecan Food Negative    12. Valley Negative    13. Almond Negative    14. Hazelnut Negative    15. [place] nut Negative    16. Coconut Negative    17. Pistachio --   +/-   18. Catfish Negative    19. Bass Negative    20. Trout Negative    21. Tuna Negative    22. Salmon Negative    23. Flounder Negative    24. Codfish Negative    25. Shrimp Negative    26. Crab Negative    27. Lobster Negative    28. Pulte Homes  Negative    29. Scallops Negative    30. Barley Negative    31. Oat  Negative    32. Rye  Negative    33. Hops Negative    34. Rice Negative    35. Cottonseed Negative    36. Saccharomyces Cerevisiae  Negative    37. Pork Negative    38. [place] Meat Negative    40. Beef Negative    41. Lamb Negative    50. Cabbage Negative              Assessment and Plan: 1. Seasonal and perennial allergic rhinitis   2. Anaphylactic shock due to food, subsequent encounter   3. Moderate persistent asthma, uncomplicated   4. Itching     No orders of the defined types were placed in this encounter.   Patient Instructions  1. Itching ( stable now) - Continue to avoid all red meat including bacon due to your slightly elevated alpha gal. Have access to your epinephrine auto injector device at all times  2. Moderate persistent asthma, uncomplicated - Daily controller medication(s): Symbicort 160/4.83mg two puffs twice daily with spacer - Prior to physical activity: albuterol 2 puffs 10-15 minutes  before physical activity. - Rescue medications: albuterol 4 puffs every 4-6 hours as needed - Asthma control goals:  * Full participation in all desired activities (may need albuterol before activity) * Albuterol use two time or less a week on average (not counting use with activity) * Cough interfering with sleep two time or less a month * Oral steroids no more than once a year * No hospitalizations  3. Chronic rhinitis Continue levocetirizne as prescribed by your primary care phyician Skin testing was positive to grass pollen, one weed pollen,cat and horse. Start avoidance measures as below.  4. Anaphylactic shock due to food, subsequent encounter Skin testing today was slightly positive to pistachio. You can try avoiding it to see if it changes any of your symptoms. If your symptoms re-occur, begin a journal of events that occurred for up to 6 hours before your symptoms began including foods and beverages consumed, soaps or perfumes you had contact with, and medications.  Have access to your epinephrine autoinjector device   5. Schedule a follow up appointment in 2 months or sooner if needed  Control of Dog or Cat Allergen Avoidance is the best way to manage a dog or cat allergy. If you have a dog or cat and are allergic to dog or cats, consider removing the dog or cat from the home. If you have a dog or cat but don't want to find it a new home, or if your family wants a pet even though someone in the household is allergic, here are some strategies that may help keep symptoms at bay:  Keep the pet out of your bedroom and restrict it to only a few rooms. Be advised that keeping the dog or cat in only one room will not limit the allergens to that room. Don't pet, hug or kiss the dog or cat; if you do, wash your hands with soap and water. High-efficiency particulate air (HEPA) cleaners run continuously in a bedroom or living room can reduce allergen levels over time. Regular use of a  high-efficiency vacuum cleaner or a central vacuum can reduce allergen levels. Giving your dog or cat a bath at least once a week can reduce airborne allergen.   Reducing Pollen Exposure The American Academy of Allergy, Asthma and Immunology suggests  the following steps to reduce your exposure to pollen during allergy seasons. Do not hang sheets or clothing out to dry; pollen may collect on these items. Do not mow lawns or spend time around freshly cut grass; mowing stirs up pollen. Keep windows closed at night.  Keep car windows closed while driving. Minimize morning activities outdoors, a time when pollen counts are usually at their highest. Stay indoors as much as possible when pollen counts or humidity is high and on windy days when pollen tends to remain in the air longer. Use air conditioning when possible.  Many air conditioners have filters that trap the pollen spores. Use a HEPA room air filter to remove pollen form the indoor air you breathe.      Return in about 2 months (around 07/09/2022), or if symptoms worsen or fail to improve.    Thank you for the opportunity to care for this patient.  Please do not hesitate to contact me with questions.  Althea Charon, FNP [facility]

## 2022-05-25 ENCOUNTER — Ambulatory Visit: Payer: Medicare Other | Admitting: Allergy & Immunology

## 2022-05-25 DIAGNOSIS — M7061 Trochanteric bursitis, right hip: Secondary | ICD-10-CM | POA: Diagnosis not present

## 2022-05-30 ENCOUNTER — Ambulatory Visit: Payer: Medicare Other | Admitting: Allergy & Immunology

## 2022-06-01 DIAGNOSIS — H348122 Central retinal vein occlusion, left eye, stable: Secondary | ICD-10-CM | POA: Diagnosis not present

## 2022-06-01 DIAGNOSIS — Z961 Presence of intraocular lens: Secondary | ICD-10-CM | POA: Diagnosis not present

## 2022-06-01 DIAGNOSIS — H524 Presbyopia: Secondary | ICD-10-CM | POA: Diagnosis not present

## 2022-06-07 ENCOUNTER — Ambulatory Visit (HOSPITAL_COMMUNITY)
Admission: RE | Admit: 2022-06-07 | Discharge: 2022-06-07 | Disposition: A | Discharge status: Home / Self Care | Payer: Medicare Other | Source: Ambulatory Visit | Attending: Adult Health | Admitting: Adult Health

## 2022-06-07 DIAGNOSIS — Z1231 Encounter for screening mammogram for malignant neoplasm of breast: Secondary | ICD-10-CM | POA: Insufficient documentation

## 2022-06-12 ENCOUNTER — Ambulatory Visit: Payer: Medicare Other | Admitting: Adult Health

## 2022-06-12 ENCOUNTER — Encounter: Payer: Self-pay | Admitting: Adult Health

## 2022-06-12 VITALS — BP 142/78 | HR 63 | Ht 68.0 in | Wt 177.0 lb

## 2022-06-12 DIAGNOSIS — R1031 Right lower quadrant pain: Secondary | ICD-10-CM

## 2022-06-12 NOTE — Progress Notes (Signed)
  Subjective:     Patient ID: [name], female   DOB: [birth year], 66 y.o.   MRN: [id number]  HPI [name] is a 66 year old white female, married, PM in complaining of RLQ pain for several months. 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 Then she saw her back doctor and has had 2 steroid injections 04/13/22, and 05/25/22 with out relief.  Last pap was 07/27/21 and negative malignancy and HPV.  PCP is Dr Willey Blade  Review of Systems +RLQ pain for months Reviewed past medical,surgical, social and family history. Reviewed medications and allergies.     Objective:   Physical Exam BP (!) 142/78 (BP Location: Left Arm, Patient Position: Sitting, Cuff Size: Normal)   Pulse 63   Ht '5\' 8"'$  (1.727 m)   Wt 177 lb (80.3 kg)   LMP 12/13/2015   BMI 26.91 kg/m     Skin warm and dry.Pelvic: external genitalia is normal in appearance no lesions, vagina:pale,urethra has no lesions or masses noted, cervix:smooth, no CMT, uterus: normal size, shape and contour, non tender, no masses felt, adnexa: no masses felt, +RLQ tenderness noted. Bladder is non tender and no masses felt. No hernias felt.   Upstream - 06/12/22 1134       Pregnancy Intention Screening   Does the patient want to become pregnant in the next year? No    Does the patient's partner want to become pregnant in the next year? No    Would the patient like to discuss contraceptive options today? No      Contraception Wrap Up   Current Method Female Sterilization   postmenopausal   End Method Female Sterilization   postmenopausal   Contraception Counseling Provided No            Examination chaperoned by Levy Pupa LPN  Assessment:     1. RLQ abdominal pain +RLQ pain for several months Will get pelvic US to assess uterus and ovaries at [facility] 06/19/22 at 1:30 pm  Will talk when results back - US PELVIC COMPLETE WITH TRANSVAGINAL; Future     Plan:    If pain increases go to ER  Follow up  TBD

## 2022-06-14 ENCOUNTER — Telehealth: Payer: Self-pay | Admitting: Adult Health

## 2022-06-14 ENCOUNTER — Emergency Department (HOSPITAL_COMMUNITY): Payer: Medicare Other

## 2022-06-14 ENCOUNTER — Other Ambulatory Visit: Payer: Self-pay

## 2022-06-14 ENCOUNTER — Encounter (HOSPITAL_COMMUNITY): Payer: Self-pay | Admitting: *Deleted

## 2022-06-14 ENCOUNTER — Emergency Department (HOSPITAL_COMMUNITY)
Admission: EM | Admit: 2022-06-14 | Discharge: 2022-06-14 | Disposition: A | Discharge status: Home / Self Care | Payer: Medicare Other | Attending: Emergency Medicine | Admitting: Emergency Medicine

## 2022-06-14 DIAGNOSIS — Z79899 Other long term (current) drug therapy: Secondary | ICD-10-CM | POA: Diagnosis not present

## 2022-06-14 DIAGNOSIS — R1031 Right lower quadrant pain: Secondary | ICD-10-CM | POA: Diagnosis not present

## 2022-06-14 DIAGNOSIS — J45909 Unspecified asthma, uncomplicated: Secondary | ICD-10-CM | POA: Diagnosis not present

## 2022-06-14 DIAGNOSIS — Z7951 Long term (current) use of inhaled steroids: Secondary | ICD-10-CM | POA: Diagnosis not present

## 2022-06-14 DIAGNOSIS — I10 Essential (primary) hypertension: Secondary | ICD-10-CM | POA: Diagnosis not present

## 2022-06-14 DIAGNOSIS — R109 Unspecified abdominal pain: Secondary | ICD-10-CM | POA: Diagnosis not present

## 2022-06-14 DIAGNOSIS — I700 Atherosclerosis of aorta: Secondary | ICD-10-CM | POA: Diagnosis not present

## 2022-06-14 LAB — COMPREHENSIVE METABOLIC PANEL
ALT: 22 U/L (ref 0–44)
AST: 24 U/L (ref 15–41)
Albumin: 4.3 g/dL (ref 3.5–5.0)
Alkaline Phosphatase: 56 U/L (ref 38–126)
Anion gap: 9 (ref 5–15)
BUN: 11 mg/dL (ref 8–23)
CO2: 23 mmol/L (ref 22–32)
Calcium: 9.2 mg/dL (ref 8.9–10.3)
Chloride: 106 mmol/L (ref 98–111)
Creatinine, Ser: 1.17 mg/dL — ABNORMAL HIGH (ref 0.44–1.00)
GFR, Estimated: 52 mL/min — ABNORMAL LOW (ref >60)
Glucose, Bld: 97 mg/dL (ref 70–99)
Potassium: 3.7 mmol/L (ref 3.5–5.1)
Sodium: 138 mmol/L (ref 135–145)
Total Bilirubin: 0.6 mg/dL (ref 0.3–1.2)
Total Protein: 7.5 g/dL (ref 6.5–8.1)

## 2022-06-14 LAB — LIPASE, BLOOD: Lipase: 37 U/L (ref 11–51)

## 2022-06-14 LAB — CBC
HCT: 45.4 % (ref 36.0–46.0)
Hemoglobin: 14 g/dL (ref 12.0–15.0)
MCH: 30.4 pg (ref 26.0–34.0)
MCHC: 30.8 g/dL (ref 30.0–36.0)
MCV: 98.5 fL (ref 80.0–100.0)
Platelets: 251 10*3/uL (ref 150–400)
RBC: 4.61 MIL/uL (ref 3.87–5.11)
RDW: 14.3 % (ref 11.5–15.5)
WBC: 6.6 10*3/uL (ref 4.0–10.5)
nRBC: 0 % (ref 0.0–0.2)

## 2022-06-14 MED ORDER — HYDROMORPHONE HCL 4 MG PO TABS: ORAL_TABLET | ORAL | 0 refills | Status: DC

## 2022-06-14 MED ORDER — IOHEXOL 300 MG/ML  SOLN
100.0000 mL | Freq: Once | INTRAMUSCULAR | Status: AC | PRN
  Administered 2022-06-14: 100 mL via INTRAVENOUS

## 2022-06-14 MED ORDER — OXYCODONE-ACETAMINOPHEN 5-325 MG PO TABS
1.0000 | ORAL_TABLET | Freq: Once | ORAL | Status: DC
  Filled 2022-06-14: qty 1

## 2022-06-14 MED ORDER — OXYCODONE HCL 5 MG PO TABS
5.0000 mg | ORAL_TABLET | Freq: Once | ORAL | Status: AC
  Administered 2022-06-14: 5 mg via ORAL
  Filled 2022-06-14: qty 1

## 2022-06-14 NOTE — Telephone Encounter (Signed)
Pt states her pain is much worse and would like the [place] sooner. She is scheduled for 10/31. She has nausea now. Spoke with JAG. Pt advised to go to ER for evaluation. Pt voiced understanding. [place]

## 2022-06-14 NOTE — ED Provider Notes (Signed)
[facility] EMERGENCY DEPARTMENT Provider Note   CSN: [id number] Arrival date & time: 06/14/22  1010     History  Chief Complaint  Patient presents with   Groin Pain    [name] is a 66 y.o. female.  Patient complains of right groin pain.  She has a history of asthma and hypertension.  She has gotten 2 injections in her hip has helped some  The history is provided by the patient and medical records. No language interpreter was used.  Groin Pain This is a new problem. The problem occurs constantly. The problem has not changed since onset.Pertinent negatives include no chest pain, no abdominal pain and no headaches. Nothing aggravates the symptoms. She has tried nothing for the symptoms.       Home Medications Prior to Admission medications   Medication Sig Start Date End Date Taking? Authorizing Provider  amLODipine (NORVASC) 5 MG tablet Take 5 mg by mouth daily. 07/04/17  Yes [provider]  budesonide-formoterol (SYMBICORT) 160-4.5 MCG/ACT inhaler Inhale 2 puffs into the lungs 2 (two) times daily.   Yes [provider]  celecoxib (CELEBREX) 200 MG capsule Take 200 mg by mouth daily. 04/13/22  Yes [provider]  EPINEPHrine 0.3 mg/0.3 mL IJ SOAJ injection Inject 0.3 mg into the muscle as needed for anaphylaxis.   Yes [provider]  guaiFENesin (MUCINEX) 600 MG 12 hr tablet Take 600 mg by mouth 2 (two) times daily.   Yes [provider]  HYDROmorphone (DILAUDID) 4 MG tablet Take 1 every 6-8 hours for pain not relieved by Motrin alone 06/14/22  Yes Milton Ferguson, MD  levocetirizine (XYZAL) 5 MG tablet Take 5 mg by mouth daily. 04/06/22  Yes [provider]  lubiprostone (AMITIZA) 24 MCG capsule Take 24 mcg by mouth at bedtime. 07/11/20  Yes [provider]  PARoxetine (PAXIL) 10 MG tablet Take 10 mg by mouth daily. 06/12/21  Yes [provider]  Prenatal Vit-Fe Fumarate-FA  (MULTIVITAMIN-PRENATAL) 27-0.8 MG TABS tablet Take 1 tablet by mouth daily at 12 noon.   Yes [provider]  PROAIR HFA 108 (90 Base) MCG/ACT inhaler Inhale 2 puffs into the lungs every 6 (six) hours as needed for wheezing or shortness of breath.   Yes [provider]  RABEprazole (ACIPHEX) 20 MG tablet Take 1 tablet (20 mg total) by mouth 2 (two) times daily before a meal. 04/09/22  Yes Jodi Mourning, Kristen S, PA-C  rosuvastatin (CRESTOR) 20 MG tablet Take 20 mg by mouth at bedtime. 03/26/22  Yes [provider]  topiramate (TOPAMAX) 200 MG tablet Take 200 mg by mouth 2 (two) times daily.    Yes [provider]  traZODone (DESYREL) 50 MG tablet Take 25-50 mg by mouth at bedtime as needed for sleep. 04/06/22  Yes [provider]  VITAMIN D PO Take 1 tablet by mouth daily.   Yes [provider]      Allergies    Patient has no known allergies.    Review of Systems   Review of Systems  Constitutional:  Negative for appetite change and fatigue.  HENT:  Negative for congestion, ear discharge and sinus pressure.   Eyes:  Negative for discharge.  Respiratory:  Negative for cough.   Cardiovascular:  Negative for chest pain.  Gastrointestinal:  Negative for abdominal pain and diarrhea.       Right groin pain  Genitourinary:  Negative for frequency and hematuria.  Musculoskeletal:  Negative for back pain.  Skin:  Negative for rash.  Neurological:  Negative for seizures and headaches.  Psychiatric/Behavioral:  Negative for hallucinations.     Physical Exam Updated Vital Signs BP (!) 150/80 (BP Location: Left Arm)   Pulse 64   Temp 98.2 F (36.8 C) (Oral)   Resp 18   Ht '5\' 8"'$  (1.727 m)   Wt 80.3 kg   LMP 12/13/2015   SpO2 99%   BMI 26.91 kg/m  Physical Exam Vitals and nursing note reviewed.  Constitutional:      Appearance: She is well-developed.  HENT:     Head: Normocephalic.     Mouth/Throat:     Mouth: Mucous membranes are moist.   Eyes:     General: No scleral icterus.    Conjunctiva/sclera: Conjunctivae normal.  Neck:     Thyroid: No thyromegaly.  Cardiovascular:     Rate and Rhythm: Normal rate and regular rhythm.     Heart sounds: No murmur heard.    No friction rub. No gallop.  Pulmonary:     Breath sounds: No stridor. No wheezing or rales.  Chest:     Chest wall: No tenderness.  Abdominal:     General: There is no distension.     Tenderness: There is no abdominal tenderness. There is no rebound.     Comments: Tender right groin  Musculoskeletal:        General: Normal range of motion.     Cervical back: Neck supple.  Lymphadenopathy:     Cervical: No cervical adenopathy.  Skin:    Findings: No erythema or rash.  Neurological:     Mental Status: She is alert and oriented to person, place, and time.     Motor: No abnormal muscle tone.     Coordination: Coordination normal.  Psychiatric:        Behavior: Behavior normal.     ED Results / Procedures / Treatments   Labs (all labs ordered are listed, but only abnormal results are displayed) Labs Reviewed  COMPREHENSIVE METABOLIC PANEL - Abnormal; Notable for the following components:      Result Value   Creatinine, Ser 1.17 (*)    GFR, Estimated 52 (*)    All other components within normal limits  LIPASE, BLOOD  CBC    EKG None  Radiology CT ABDOMEN PELVIS W CONTRAST  Result Date: 06/14/2022 CLINICAL DATA:  Abdominal pain, right groin pain. EXAM: CT ABDOMEN AND PELVIS WITH CONTRAST TECHNIQUE: Multidetector CT imaging of the abdomen and pelvis was performed using the standard protocol following bolus administration of intravenous contrast. RADIATION DOSE REDUCTION: This exam was performed according to the departmental dose-optimization program which includes automated exposure control, adjustment of the mA and/or kV according to patient size and/or use of iterative reconstruction technique. CONTRAST:  185m OMNIPAQUE IOHEXOL 300 MG/ML  SOLN  COMPARISON:  12/05/2021 FINDINGS: Lower chest: No acute abnormality Hepatobiliary: No focal hepatic abnormality. Gallbladder unremarkable. Pancreas: No focal abnormality or ductal dilatation. Spleen: No focal abnormality.  Normal size. Adrenals/Urinary Tract: No adrenal abnormality. No focal renal abnormality. No stones or hydronephrosis. Urinary bladder is unremarkable. Stomach/Bowel: Scattered colonic diverticulosis. No active diverticulitis. Stomach and small bowel decompressed, unremarkable. Prior appendectomy. Vascular/Lymphatic: Aortic atherosclerosis. No evidence of aneurysm or adenopathy. Reproductive: Uterus and adnexa unremarkable.  No mass. Other: No free fluid or free air. Musculoskeletal: Prior right hip replacement. No acute bony abnormality. IMPRESSION: No acute findings in the abdomen or pelvis. Colonic diverticulosis. Aortic atherosclerosis. Electronically Signed   By: KLennette Bihari  [place] M.D.   On: 06/14/2022 13:49    Procedures Procedures    Medications Ordered in ED Medications  oxyCODONE-acetaminophen (PERCOCET/ROXICET) 5-325 MG per tablet 1 tablet (1 tablet Oral Not Given 06/14/22 1406)  iohexol (OMNIPAQUE) 300 MG/ML solution 100 mL (100 mLs Intravenous Contrast Given 06/14/22 1321)  oxyCODONE (Oxy IR/ROXICODONE) immediate release tablet 5 mg (5 mg Oral Given 06/14/22 1427)    ED Course/ Medical Decision Making/ A&P                           Medical Decision Making Amount and/or Complexity of Data Reviewed Labs: ordered. Radiology: ordered.  Risk Prescription drug management.  This patient presents to the ED for concern of right groin pain, this involves an extensive number of treatment options, and is a complaint that carries with it a high risk of complications and morbidity.  The differential diagnosis includes groin injury or hernia   Co morbidities that complicate the patient evaluation  Hypertension   Additional history obtained:  Additional history obtained  from patient External records from outside source obtained and reviewed including hospital record   Lab Tests:  I Ordered, and personally interpreted labs.  The pertinent results include: CBC and chemistries negative   Imaging Studies ordered:  I ordered imaging studies including CT abdomen I independently visualized and interpreted imaging which showed negative I agree with the radiologist interpretation   Cardiac Monitoring: / EKG:  The patient was maintained on a cardiac monitor.  I personally viewed and interpreted the cardiac monitored which showed an underlying rhythm of: Normal sinus rhythm   Consultations Obtained:  No consultant  Problem List / ED Course / Critical interventions / Medication management  Groin pain I ordered medication including oxycodone for pain Reevaluation of the patient after these medicines showed that the patient improved I have reviewed the patients home medicines and have made adjustments as needed   Social Determinants of Health:  None   Test / Admission - Considered:  None  Right groin pain and possible hernia.  She is referred to general surgery        Final Clinical Impression(s) / ED Diagnoses Final diagnoses:  Right inguinal pain    Rx / DC Orders ED Discharge Orders          Ordered    HYDROmorphone (DILAUDID) 4 MG tablet        06/14/22 1442              Milton Ferguson, MD 06/17/22 1203

## 2022-06-14 NOTE — ED Triage Notes (Signed)
Pt c/o right groin pain x 4 months but the pain has gotten worse in the last week; pt was seeing her orthopedic doctor and getting steroid injections in the right hit thinking the pain was coming from a hip replacement she had done but pt states the pain was not getting any better and her GYN doctor thinks it may be related to a hernia

## 2022-06-14 NOTE — Telephone Encounter (Signed)
Pt states she is in a lot of pain. Pt states she would like the [place] sooner.

## 2022-06-14 NOTE — Discharge Instructions (Signed)
Follow up with dr. Derryl Harbor or one of her colleagues in the next 1 to 2 weeks to check out your groin discomfort

## 2022-06-14 NOTE — ED Notes (Signed)
Pt departed before signature pad was ready

## 2022-06-19 ENCOUNTER — Ambulatory Visit (HOSPITAL_COMMUNITY)
Admission: RE | Admit: 2022-06-19 | Discharge: 2022-06-19 | Disposition: A | Discharge status: Home / Self Care | Payer: Medicare Other | Source: Ambulatory Visit | Attending: Adult Health | Admitting: Adult Health

## 2022-06-19 DIAGNOSIS — R1031 Right lower quadrant pain: Secondary | ICD-10-CM | POA: Insufficient documentation

## 2022-06-19 DIAGNOSIS — Z780 Asymptomatic menopausal state: Secondary | ICD-10-CM | POA: Diagnosis not present

## 2022-07-09 DIAGNOSIS — N1831 Chronic kidney disease, stage 3a: Secondary | ICD-10-CM | POA: Diagnosis not present

## 2022-07-09 DIAGNOSIS — G4700 Insomnia, unspecified: Secondary | ICD-10-CM | POA: Diagnosis not present

## 2022-07-09 DIAGNOSIS — E785 Hyperlipidemia, unspecified: Secondary | ICD-10-CM | POA: Diagnosis not present

## 2022-07-09 DIAGNOSIS — Z79899 Other long term (current) drug therapy: Secondary | ICD-10-CM | POA: Diagnosis not present

## 2022-07-09 DIAGNOSIS — I10 Essential (primary) hypertension: Secondary | ICD-10-CM | POA: Diagnosis not present

## 2022-07-11 DIAGNOSIS — Z96641 Presence of right artificial hip joint: Secondary | ICD-10-CM | POA: Diagnosis not present

## 2022-07-11 DIAGNOSIS — M7061 Trochanteric bursitis, right hip: Secondary | ICD-10-CM | POA: Diagnosis not present

## 2022-07-11 DIAGNOSIS — M25551 Pain in right hip: Secondary | ICD-10-CM | POA: Diagnosis not present

## 2022-07-30 ENCOUNTER — Encounter: Payer: Self-pay | Admitting: *Deleted

## 2022-07-30 ENCOUNTER — Ambulatory Visit: Payer: Medicare Other | Admitting: Gastroenterology

## 2022-07-30 ENCOUNTER — Encounter: Payer: Self-pay | Admitting: Gastroenterology

## 2022-07-30 VITALS — BP 127/84 | HR 71 | Temp 98.3°F | Ht 68.0 in | Wt 183.4 lb

## 2022-07-30 DIAGNOSIS — R1031 Right lower quadrant pain: Secondary | ICD-10-CM

## 2022-07-30 MED ORDER — PEG 3350-KCL-NA BICARB-NACL 420 G PO SOLR: 4000.0000 mL | Freq: Once | ORAL | 0 refills | Status: AC

## 2022-07-30 NOTE — Patient Instructions (Signed)
Colonoscopy to be scheduled. See separate instructions. For constipation, try taking Amitiza 32mg daily and then 2106m every other night. If you still do not tolerate that then we can try switching you to another medication.

## 2022-07-30 NOTE — Progress Notes (Signed)
GI Office Note    Referring Provider: Asencion Noble, MD Primary Care Physician:  Asencion Noble, MD  Primary Gastroenterologist: Elon Alas. Abbey Chatters, DO   Chief Complaint   Chief Complaint  Patient presents with   Follow-up    Bad prep, states she failed her colonoscopy.     History of Present Illness   [name] is a 66 y.o. female presenting today for follow up due to incomplete colonoscopy. She was last seen in the office in 03/2022. H/o GERD, constipation, Alpha-Gal (diagnosed 04/2022).   She we last saw her she has been having some right groin pain. She thought it could be due to previous hip replacement in 2021. She has seen gyn and completed pelvic u/s without a source for her pain. She also was seen in the ED. She had a CT without no explanation for the pain. She has received two shots in the right hip for this groin pain. States it did seem to help. Today she denies any pain. She is having daily BMs, but not complete. Takes Amitiza 25mg every morning but does not take at night because she will have accidents. No melena, brbpr. Avoiding red meats since diagnosis of Alpha-Gal in 04/2022. Seems to note improvement in itching.    U/S pelvic 05/2022: IMPRESSION: -LEFT lateral uterine leiomyoma 2.6 cm greatest size. -Trace nonspecific endometrial fluid. -Remainder of exam unremarkable.  CT A/P with contrast 05/2022 IMPRESSION: -No acute findings in the abdomen or pelvis. -Colonic diverticulosis. -Aortic atherosclerosis.   Colonoscopy 04/2022 INCOMPLETE - Preparation of the colon was inadequate. - Non-bleeding internal hemorrhoids. - Stool in the transverse colon, in the ascending colon and in the cecum. - No specimens collected. -recommended colonoscopy in six months.   Medications   Current Outpatient Medications  Medication Sig Dispense Refill   ALPRAZolam (XANAX) 1 MG tablet Take 1 mg by mouth daily as needed.     amLODipine (NORVASC) 5 MG tablet Take 5 mg by  mouth daily.  0   budesonide-formoterol (SYMBICORT) 160-4.5 MCG/ACT inhaler Inhale 2 puffs into the lungs 2 (two) times daily.     celecoxib (CELEBREX) 200 MG capsule Take 200 mg by mouth daily.     EPINEPHrine 0.3 mg/0.3 mL IJ SOAJ injection Inject 0.3 mg into the muscle as needed for anaphylaxis.     guaiFENesin (MUCINEX) 600 MG 12 hr tablet Take 600 mg by mouth 2 (two) times daily.     HYDROmorphone (DILAUDID) 4 MG tablet Take 1 every 6-8 hours for pain not relieved by Motrin alone 15 tablet 0   levocetirizine (XYZAL) 5 MG tablet Take 5 mg by mouth daily.     lubiprostone (AMITIZA) 24 MCG capsule Take 24 mcg by mouth daily with breakfast.     PARoxetine (PAXIL) 10 MG tablet Take 10 mg by mouth daily.     Prenatal Vit-Fe Fumarate-FA (MULTIVITAMIN-PRENATAL) 27-0.8 MG TABS tablet Take 1 tablet by mouth daily at 12 noon.     PROAIR HFA 108 (90 Base) MCG/ACT inhaler Inhale 2 puffs into the lungs every 6 (six) hours as needed for wheezing or shortness of breath.  1   RABEprazole (ACIPHEX) 20 MG tablet Take 1 tablet (20 mg total) by mouth 2 (two) times daily before a meal. 60 tablet 5   rosuvastatin (CRESTOR) 20 MG tablet Take 20 mg by mouth at bedtime.     topiramate (TOPAMAX) 200 MG tablet Take 200 mg by mouth 2 (two) times daily.  traZODone (DESYREL) 50 MG tablet Take 25-50 mg by mouth at bedtime as needed for sleep.     VITAMIN D PO Take 1 tablet by mouth daily.     No current facility-administered medications for this visit.    Allergies   Allergies as of 07/30/2022 - Review Complete 07/30/2022  Allergen Reaction Noted   Alpha-gal  07/30/2022   Percocet [oxycodone-acetaminophen] Nausea And Vomiting 06/14/2022    Past Medical History   Past Medical History:  Diagnosis Date   Abnormal Pap smear of cervix    Anxiety    Arthritis    Asthma    Depression    Essential hypertension    Fibromyalgia    GERD (gastroesophageal reflux disease)    History of stroke    PVC's  (premature ventricular contractions)    Sciatica of right side    Stroke ([place])    STROKE IN RIGHT EYE 20 YEAS AGO O PROBLEMS SINCE     Past Surgical History   Past Surgical History:  Procedure Laterality Date   24 HOUR [place] STUDY  10/31/2020   Procedure: 24 HOUR [place] STUDY;  Surgeon: Mauri Pole, MD;  Location: [facility];  Service: Endoscopy;;   ADENOIDECTOMY     APPENDECTOMY     BUNIONECTOMY Bilateral    CHONDROPLASTY Right 06/19/2018   Procedure: KNEE ARTHROSCOPY WITH chondroplasty;  Surgeon: Carole Civil, MD;  Location: [facility];  Service: Orthopedics;  Laterality: Right;   COLONOSCOPY  10/08/2011   diverticulosis, hyperplastic polyp removed. next tcs 2023.   COLONOSCOPY WITH PROPOFOL N/A 04/30/2022   Procedure: COLONOSCOPY WITH PROPOFOL;  Surgeon: Eloise Harman, DO;  Location: [facility];  Service: Endoscopy;  Laterality: N/A;  1:15 pm   ESOPHAGEAL MANOMETRY N/A 10/31/2020   Procedure: ESOPHAGEAL MANOMETRY (EM);  Surgeon: Mauri Pole, MD;  Location: [facility];  Service: Endoscopy;  Laterality: N/A;   ESOPHAGOGASTRODUODENOSCOPY N/A 03/12/2019   (EGD) with pyloric dilation;  Surgeon: Danie Binder, MD; nonobstructing Schatzki ring s/p dilation, moderate sized hiatal hernia, mild gastritis s/p biopsy, single gastric polyp s/p biopsy, possible gastroparesis with retained gastric contents.  Pathology benign with no H. pylori.   ESOPHAGOGASTRODUODENOSCOPY (EGD) WITH PROPOFOL N/A 08/24/2019   normal stomach, low-grade Schatzki ring s/p dilation, LA Grade A reflux esophagitis, s/p biopsy. Normal esophageal biopsies   FOOT SURGERY  08/2018   FOOT SURGERY  05/14/2019   KNEE SURGERY  05/2018   PH IMPEDANCE STUDY N/A 10/31/2020   Procedure: [place] IMPEDANCE STUDY;  Surgeon: Mauri Pole, MD;  Location: [facility];  Service: Endoscopy;  Laterality: N/A;   POLYPECTOMY  03/12/2019   Procedure: POLYPECTOMY;  Surgeon: Danie Binder, MD;  Location: [facility];  Service: Endoscopy;;   TONSILLECTOMY     TOTAL HIP ARTHROPLASTY Right 07/08/2020   Procedure: TOTAL HIP ARTHROPLASTY;  Surgeon: Earlie Server, MD;  Location: [facility];  Service: Orthopedics;  Laterality: Right;   TUBAL LIGATION     WRIST SURGERY      Past Family History   Family History  Problem Relation Age of Onset   Hypertension Mother    Diverticulosis Mother    Hypertension Father    AAA (abdominal aortic aneurysm) Father    Cancer Brother        not sure where primary, was metastatic. was jaundice.    COPD Brother    Angioedema Neg Hx    Atopy Neg Hx    Eczema Neg Hx  Immunodeficiency Neg Hx    Urticaria Neg Hx    Asthma Neg Hx    Allergic rhinitis Neg Hx    Colon cancer Neg Hx     Past Social History   Social History   Socioeconomic History   Marital status: Married    Spouse name: Not on file   Number of children: Not on file   Years of education: Not on file   Highest education level: Not on file  Occupational History   Not on file  Tobacco Use   Smoking status: Former    Packs/day: 0.25    Years: 1.00    Total pack years: 0.25    Types: Cigarettes    Quit date: 06/17/1975    Years since quitting: 47.1   Smokeless tobacco: Never  Vaping Use   Vaping Use: Never used  Substance and Sexual Activity   Alcohol use: No   Drug use: No   Sexual activity: Not Currently    Birth control/protection: Surgical, Post-menopausal    Comment: tubal  Other Topics Concern   Not on file  Social History Narrative   Not on file   Social Determinants of Health   Financial Resource Strain: Low Risk  (07/27/2021)   Overall Financial Resource Strain (CARDIA)    Difficulty of Paying Living Expenses: Not very hard  Food Insecurity: No Food Insecurity (07/27/2021)   Hunger Vital Sign    Worried About Running Out of Food in the Last Year: Never true    [place] in the Last Year: Never true  Transportation Needs: No Transportation Needs (07/27/2021)    PRAPARE - Hydrologist (Medical): No    Lack of Transportation (Non-Medical): No  Physical Activity: Insufficiently Active (07/27/2021)   Exercise Vital Sign    Days of Exercise per Week: 3 days    Minutes of Exercise per Session: 20 min  Stress: No Stress Concern Present (07/27/2021)   [place]    Feeling of Stress : Only a little  Social Connections: Moderately Integrated (07/27/2021)   Social Connection and Isolation Panel [NHANES]    Frequency of Communication with Friends and Family: More than three times a week    Frequency of Social Gatherings with Friends and Family: Twice a week    Attends Religious Services: More than 4 times per year    Active Member of [employer] or Organizations: Yes    Attends Archivist Meetings: Never    Marital Status: Divorced  Human resources officer Violence: Not At Risk (07/27/2021)   Humiliation, Afraid, Rape, and Kick questionnaire    Fear of Current or Ex-Partner: No    Emotionally Abused: No    Physically Abused: No    Sexually Abused: No    Review of Systems   General: Negative for anorexia, weight loss, fever, chills, fatigue, weakness. Eyes: Negative for vision changes.  ENT: Negative for hoarseness, difficulty swallowing , nasal congestion. CV: Negative for chest pain, angina, palpitations, dyspnea on exertion, peripheral edema.  Respiratory: Negative for dyspnea at rest, dyspnea on exertion, cough, sputum, wheezing.  GI: See history of present illness. No heartburn, vomiting, dysphagia.  GU:  Negative for dysuria, hematuria, urinary incontinence, urinary frequency, nocturnal urination.  MS: Negative for joint pain, low back pain.  Derm: Negative for rash or itching.  Neuro: Negative for weakness, abnormal sensation, seizure, frequent headaches, memory loss,  confusion.  Psych: Negative for anxiety, depression,  suicidal ideation,  hallucinations.  Endo: Negative for unusual weight change.  Heme: Negative for bruising or bleeding. Allergy: Negative for rash or hives.  Physical Exam   BP 127/84 (BP Location: Right Arm, Patient Position: Sitting, Cuff Size: Large)   Pulse 71   Temp 98.3 F (36.8 C) (Oral)   Ht '5\' 8"'$  (1.727 m)   Wt 183 lb 6.4 oz (83.2 kg)   LMP 12/13/2015   SpO2 96%   BMI 27.89 kg/m    General: Well-nourished, well-developed in no acute distress.  Head: Normocephalic, atraumatic.   Eyes: Conjunctiva pink, no icterus. Mouth: Oropharyngeal mucosa moist and pink , no lesions erythema or exudate. Neck: Supple without thyromegaly, masses, or lymphadenopathy.  Lungs: Clear to auscultation bilaterally.  Heart: Regular rate and rhythm, no murmurs rubs or gallops.  Abdomen: Bowel sounds are normal, nontender, nondistended, no hepatosplenomegaly or masses,  no abdominal bruits or hernia, no rebound or guarding.   Rectal: not performed Extremities: No lower extremity edema. No clubbing or deformities.  Neuro: Alert and oriented x 4 , grossly normal neurologically.  Skin: Warm and dry, no rash or jaundice.   Psych: Alert and cooperative, normal mood and affect.  Labs   Lab Results  Component Value Date   CREATININE 1.17 (H) 06/14/2022   BUN 11 06/14/2022   NA 138 06/14/2022   K 3.7 06/14/2022   CL 106 06/14/2022   CO2 23 06/14/2022   Lab Results  Component Value Date   WBC 6.6 06/14/2022   HGB 14.0 06/14/2022   HCT 45.4 06/14/2022   MCV 98.5 06/14/2022   PLT 251 06/14/2022   Lab Results  Component Value Date   ALT 22 06/14/2022   AST 24 06/14/2022   ALKPHOS 56 06/14/2022   BILITOT 0.6 06/14/2022   Lab Results  Component Value Date   LIPASE 37 06/14/2022    Imaging Studies   No results found.  Assessment   RLQ (right groin) pain: noted improvement with shots to the right hip area. Previously right hip replacement. Pelvic U/S and CT A/P findings reassuring. Continue to  monitor for now. Complete colonoscopy.  Incomplete colonoscopy: plan for modified 2 day bowel prep.  Constipation: stools more regular on Amitiza but unable to take night time dose due to stools becoming more loose and episodes of incontinence. On once a day, stools incomplete. She will tray taking night time dose every other not but if still does not tolerate than switch medication.    PLAN   Colonoscopy. ASA 3.  I have discussed the risks, alternatives, benefits with regards to but not limited to the risk of reaction to medication, bleeding, infection, perforation and the patient is agreeable to proceed. Written consent to be obtained. Amitiza 30mg every morning, 271m every other night.    LeLaureen OchsLeBobby RumpfMHPanama City BeachPASonoraastroenterology Associates

## 2022-07-31 NOTE — Patient Instructions (Incomplete)
1. Itching ( stable now) - Continue to avoid all red meat including bacon due to your slightly elevated alpha gal. Have access to your epinephrine auto injector device at all times  2. Moderate persistent asthma, uncomplicated - Daily controller medication(s): Symbicort 160/4.32mg two puffs twice daily with spacer. Spacer demonstration given. Spacer will be sent to the pharmacy - Prior to physical activity: albuterol 2 puffs 10-15 minutes before physical activity. - Rescue medications: albuterol 4 puffs every 4-6 hours as needed - Asthma control goals:  * Full participation in all desired activities (may need albuterol before activity) * Albuterol use two time or less a week on average (not counting use with activity) * Cough interfering with sleep two time or less a month * Oral steroids no more than once a year * No hospitalizations  3. Allergic rhinitis Continue levocetirizne as prescribed by your primary care phyician Skin testing  on 05/09/2022 was positive to grass pollen, one weed pollen,cat and horse.  Continue avoidance measures as below.  4. Anaphylactic shock due to food, subsequent encounter Skin testing on 05/09/22 was slightly positive to pistachio. You can try avoiding it to see if it changes any of your symptoms. If your symptoms re-occur, begin a journal of events that occurred for up to 6 hours before your symptoms began including foods and beverages consumed, soaps or perfumes you had contact with, and medications.  Have access to your epinephrine autoinjector device at all times Time if you are having a reaction make sure you stop eating the food item you are eating when the reaction   5.  Reflux Continue Aciphex twice a day as per your gastroenterologist Continue dietary and lifestyle modifications.   Schedule a follow up appointment in 4-6 months or sooner if needed  Control of Dog or Cat Allergen Avoidance is the best way to manage a dog or cat allergy. If you  have a dog or cat and are allergic to dog or cats, consider removing the dog or cat from the home. If you have a dog or cat but don't want to find it a new home, or if your family wants a pet even though someone in the household is allergic, here are some strategies that may help keep symptoms at bay:  Keep the pet out of your bedroom and restrict it to only a few rooms. Be advised that keeping the dog or cat in only one room will not limit the allergens to that room. Don't pet, hug or kiss the dog or cat; if you do, wash your hands with soap and water. High-efficiency particulate air (HEPA) cleaners run continuously in a bedroom or living room can reduce allergen levels over time. Regular use of a high-efficiency vacuum cleaner or a central vacuum can reduce allergen levels. Giving your dog or cat a bath at least once a week can reduce airborne allergen.   Reducing Pollen Exposure The American Academy of Allergy, Asthma and Immunology suggests the following steps to reduce your exposure to pollen during allergy seasons. Do not hang sheets or clothing out to dry; pollen may collect on these items. Do not mow lawns or spend time around freshly cut grass; mowing stirs up pollen. Keep windows closed at night.  Keep car windows closed while driving. Minimize morning activities outdoors, a time when pollen counts are usually at their highest. Stay indoors as much as possible when pollen counts or humidity is high and on windy days when pollen tends to remain in  the air longer. Use air conditioning when possible.  Many air conditioners have filters that trap the pollen spores. Use a HEPA room air filter to remove pollen form the indoor air you breathe.

## 2022-08-01 ENCOUNTER — Ambulatory Visit: Payer: Medicare Other | Admitting: Family

## 2022-08-01 ENCOUNTER — Other Ambulatory Visit: Payer: Self-pay

## 2022-08-01 ENCOUNTER — Encounter: Payer: Self-pay | Admitting: Family

## 2022-08-01 VITALS — BP 126/80 | HR 70 | Temp 97.2°F | Resp 18 | Ht 68.0 in | Wt 183.4 lb

## 2022-08-01 DIAGNOSIS — K219 Gastro-esophageal reflux disease without esophagitis: Secondary | ICD-10-CM

## 2022-08-01 DIAGNOSIS — J3089 Other allergic rhinitis: Secondary | ICD-10-CM | POA: Diagnosis not present

## 2022-08-01 DIAGNOSIS — L299 Pruritus, unspecified: Secondary | ICD-10-CM

## 2022-08-01 DIAGNOSIS — J302 Other seasonal allergic rhinitis: Secondary | ICD-10-CM

## 2022-08-01 DIAGNOSIS — J4540 Moderate persistent asthma, uncomplicated: Secondary | ICD-10-CM | POA: Diagnosis not present

## 2022-08-01 DIAGNOSIS — T7800XD Anaphylactic reaction due to unspecified food, subsequent encounter: Secondary | ICD-10-CM

## 2022-08-01 NOTE — Progress Notes (Signed)
[place], [address] [place] [address] Dept: [phone]  FOLLOW UP NOTE  Patient ID: [name], female    DOB: [birth year]  Age: 66 y.o. MRN: [id number] Date of Office Visit: 08/01/2022  Assessment  Chief Complaint: Allergic Rhinitis , Asthma (No issues ), and Allergic Reaction (Had a reaction to a sub - itching in her hands and hives all over )  HPI [name] is a 66 year old female who presents today for follow-up of seasonal and perennial allergic rhinitis, anaphylactic shock due to food, moderate persistent asthma, and itching.  She was last seen on May 09, 2022 by myself.  She denies any new diagnosis or surgeries.  Anaphylactic shock due to food: She reports that she was at a new restaurant in [place] that sells sub sandwiches and had a reaction.  She thinks the restaurant was [employer].  She is not certain what was in her sandwich and does not think that she will be able to find out.  She had eaten half of the sandwich and had a "big reaction."  It felt like it was "running in her blood in her right hand."  It starts with tingling/itching and then it went to her left hand.  She reports this is how she knows if it is going to be a bad reaction.  She then started chugging water to get rid of it.  Discussed that chugging water would not stop the reaction.  She then developed hives on her abdomen and legs.  She took Benadryl.  She denies any concomitant cardiorespiratory and gastrointestinal symptoms.  She then proceeded to finish eating the rest of the sandwich because it tasted good.  Discussed the importance of not continuing to eat the food that she possibly had the reaction to.  She reports that she had just 2 bites left and did not want to throw away.  She did not use her EpiPen.  She thinks that her EpiPen is up-to-date.  Itching is reported as stable except for with this recent reaction with a sub sandwich.  She is avoiding red meat, but does  mention that she can eat bacon without any problems.  Discussed that with an elevated alpha gal even though slightly elevated, she needs to continue to avoid all red meat.  Moderate persistent asthma: She continues to take Symbicort 160/4.5 mcg 2 puffs twice a day without a spacer.  She is interested in having a spacer.  She also has albuterol to use as needed.  She reports shortness of breath if she does heavy lifting only.  She denies coughing, wheezing, tightness in her chest, and nocturnal awakenings due to breathing problems.  Since her last office visit she has not required systemic steroids or made any trips to the emergency room or urgent care due to breathing problems.  She randomly uses her albuterol inhaler.  Chronic rhinitis: She reports a drippy runny nose with clear rhinorrhea.  She also reports postnasal drip and feeling like a furball is in her throat.  She denies nasal congestion.  She has not had any sinus infections since we last saw her.  She is currently taking reflux medicine twice a day and continues to have her reflux symptoms every day.  She last saw GI on Monday and reports in the past she has had her esophagus stretched.  Discussed referring her to ear nose and throat to have them take a look at her vocal cords.  She reports that she has  seen ENT in the past and there is nothing wrong and she does not wish another referral.She  reports that she has had the sensation of something being in her throat for a long time.   Drug Allergies:  Allergies  Allergen Reactions   Alpha-Gal    Percocet [Oxycodone-Acetaminophen] Nausea And Vomiting    Pt states she can take oxycodone and tylenol, but percocet makes her throw up    Review of Systems: Review of Systems  Constitutional:  Negative for chills and fever.  HENT:         Reports clear drippy rhinorrhea and post nasal drip. Denies nasal congestion  Eyes:        Denies itchy watery eyes  Respiratory:  Positive for shortness of  breath. Negative for cough and wheezing.        Reports shortness of breath with heavy lifting. Denies cough, wheeze, tightness in chest, and nocturnal awakenings due to breathing problems  Gastrointestinal:        Reports reflux  symptoms daily   Genitourinary:  Negative for frequency.  Skin:  Positive for rash.       Hives with sub sandwich reaction  Neurological:  Positive for headaches.       Takes Topamax for headaches  Endo/Heme/Allergies:  Positive for environmental allergies.     Physical Exam: BP 126/80   Pulse 70   Temp (!) 97.2 F (36.2 C)   Resp 18   Ht '5\' 8"'$  (1.727 m)   Wt 183 lb 6.4 oz (83.2 kg)   LMP 12/13/2015   SpO2 97%   BMI 27.89 kg/m    Physical Exam Constitutional:      Appearance: Normal appearance.  HENT:     Head: Normocephalic and atraumatic.     Comments: Pharynx normal, eyes normal, ears normal, nose normal    Right Ear: Tympanic membrane, ear canal and external ear normal.     Left Ear: Tympanic membrane, ear canal and external ear normal.     Nose: Nose normal.     Mouth/Throat:     Mouth: Mucous membranes are moist.     Pharynx: Oropharynx is clear.  Eyes:     Conjunctiva/sclera: Conjunctivae normal.  Cardiovascular:     Rate and Rhythm: Normal rate and regular rhythm.     Heart sounds: Normal heart sounds.  Pulmonary:     Effort: Pulmonary effort is normal.     Breath sounds: Normal breath sounds.     Comments: Lungs clear to auscultation Musculoskeletal:     Cervical back: Neck supple.  Skin:    General: Skin is warm.     Comments: No rashes or urticarial lesions present  Neurological:     Mental Status: She is alert and oriented to person, place, and time.  Psychiatric:        Mood and Affect: Mood normal.        Behavior: Behavior normal.        Thought Content: Thought content normal.        Judgment: Judgment normal.     Diagnostics: FVC 3.53 L (103%), FEV1 2.50 L (94%).  Predicted FVC 3.42 L, predicted FEV1 2.65 L.   Spirometry indicates normal respiratory function.  Assessment and Plan: 1. Moderate persistent asthma, uncomplicated   2. Seasonal and perennial allergic rhinitis   3. Anaphylactic shock due to food, subsequent encounter   4. Itching   5. Gastroesophageal reflux disease, unspecified whether esophagitis present     No  orders of the defined types were placed in this encounter.   Patient Instructions  1. Itching ( stable now) - Continue to avoid all red meat including bacon due to your slightly elevated alpha gal. Have access to your epinephrine auto injector device at all times  2. Moderate persistent asthma, uncomplicated - Daily controller medication(s): Symbicort 160/4.39mg two puffs twice daily with spacer. Spacer demonstration given. Spacer will be sent to the pharmacy - Prior to physical activity: albuterol 2 puffs 10-15 minutes before physical activity. - Rescue medications: albuterol 4 puffs every 4-6 hours as needed - Asthma control goals:  * Full participation in all desired activities (may need albuterol before activity) * Albuterol use two time or less a week on average (not counting use with activity) * Cough interfering with sleep two time or less a month * Oral steroids no more than once a year * No hospitalizations  3. Allergic rhinitis Continue levocetirizne as prescribed by your primary care phyician Skin testing  on 05/09/2022 was positive to grass pollen, one weed pollen,cat and horse.  Continue avoidance measures as below.  4. Anaphylactic shock due to food, subsequent encounter Skin testing on 05/09/22 was slightly positive to pistachio. You can try avoiding it to see if it changes any of your symptoms. If your symptoms re-occur, begin a journal of events that occurred for up to 6 hours before your symptoms began including foods and beverages consumed, soaps or perfumes you had contact with, and medications.  Have access to your epinephrine autoinjector device  at all times Time if you are having a reaction make sure you stop eating the food item you are eating when the reaction   5.  Reflux Continue Aciphex twice a day as per your gastroenterologist Continue dietary and lifestyle modifications.   Schedule a follow up appointment in 4-6 months or sooner if needed  Control of Dog or Cat Allergen Avoidance is the best way to manage a dog or cat allergy. If you have a dog or cat and are allergic to dog or cats, consider removing the dog or cat from the home. If you have a dog or cat but don't want to find it a new home, or if your family wants a pet even though someone in the household is allergic, here are some strategies that may help keep symptoms at bay:  Keep the pet out of your bedroom and restrict it to only a few rooms. Be advised that keeping the dog or cat in only one room will not limit the allergens to that room. Don't pet, hug or kiss the dog or cat; if you do, wash your hands with soap and water. High-efficiency particulate air (HEPA) cleaners run continuously in a bedroom or living room can reduce allergen levels over time. Regular use of a high-efficiency vacuum cleaner or a central vacuum can reduce allergen levels. Giving your dog or cat a bath at least once a week can reduce airborne allergen.   Reducing Pollen Exposure The American Academy of Allergy, Asthma and Immunology suggests the following steps to reduce your exposure to pollen during allergy seasons. Do not hang sheets or clothing out to dry; pollen may collect on these items. Do not mow lawns or spend time around freshly cut grass; mowing stirs up pollen. Keep windows closed at night.  Keep car windows closed while driving. Minimize morning activities outdoors, a time when pollen counts are usually at their highest. Stay indoors as much as possible when pollen  counts or humidity is high and on windy days when pollen tends to remain in the air longer. Use air  conditioning when possible.  Many air conditioners have filters that trap the pollen spores. Use a HEPA room air filter to remove pollen form the indoor air you breathe.     Return in about 4 months (around 12/01/2022), or if symptoms worsen or fail to improve.    Thank you for the opportunity to care for this patient.  Please do not hesitate to contact me with questions.  Althea Charon, FNP [facility]

## 2022-08-02 ENCOUNTER — Telehealth: Payer: Self-pay

## 2022-08-02 NOTE — Telephone Encounter (Addendum)
Per provider - Called patient - DOB verified - advised of provider notation.   Patient advised Lab test requisition will be up front for her to pick up in [facility] on tomorrow, Friday, 07/31/22 to take to [facility] to have additional blood work done.  Patient verbalized understanding, no further questions.  Lab test requisition given to [facility] to take to [facility].   ----- Message from Valentina Shaggy, MD sent at 08/02/2022  6:05 AM EST ----- Can someone call the patient to let her know that we ordered another lab to rule out a rare disorder called mastocytosis? This can be drawn at a [facility]

## 2022-08-02 NOTE — Addendum Note (Signed)
Addended by: Valentina Shaggy on: 08/02/2022 06:04 AM   Modules accepted: Orders

## 2022-08-03 DIAGNOSIS — L299 Pruritus, unspecified: Secondary | ICD-10-CM | POA: Diagnosis not present

## 2022-08-03 DIAGNOSIS — T7800XD Anaphylactic reaction due to unspecified food, subsequent encounter: Secondary | ICD-10-CM | POA: Diagnosis not present

## 2022-08-08 DIAGNOSIS — N1831 Chronic kidney disease, stage 3a: Secondary | ICD-10-CM | POA: Diagnosis not present

## 2022-08-08 DIAGNOSIS — E875 Hyperkalemia: Secondary | ICD-10-CM | POA: Diagnosis not present

## 2022-08-08 DIAGNOSIS — I10 Essential (primary) hypertension: Secondary | ICD-10-CM | POA: Diagnosis not present

## 2022-08-10 LAB — KIT (D816V) DIGITAL PCR: CKIT Result: NEGATIVE

## 2022-08-14 NOTE — Progress Notes (Signed)
Thanks for the update

## 2022-08-21 ENCOUNTER — Telehealth: Payer: Self-pay | Admitting: *Deleted

## 2022-08-21 NOTE — Telephone Encounter (Signed)
PA approved via [facility]. Auth# [id number], DOS: Sep 10, 2022 - Dec 09, 2022

## 2022-08-22 DIAGNOSIS — M7061 Trochanteric bursitis, right hip: Secondary | ICD-10-CM | POA: Diagnosis not present

## 2022-09-05 ENCOUNTER — Encounter (HOSPITAL_COMMUNITY)
Admission: RE | Admit: 2022-09-05 | Discharge: 2022-09-05 | Disposition: A | Discharge status: Home / Self Care | Payer: Medicare Other | Source: Ambulatory Visit | Attending: Internal Medicine | Admitting: Internal Medicine

## 2022-09-10 ENCOUNTER — Ambulatory Visit (HOSPITAL_COMMUNITY)
Admission: RE | Admit: 2022-09-10 | Discharge: 2022-09-10 | Disposition: A | Discharge status: Home / Self Care | Payer: Medicare Other | Attending: Internal Medicine | Admitting: Internal Medicine

## 2022-09-10 ENCOUNTER — Encounter (HOSPITAL_COMMUNITY): Payer: Self-pay

## 2022-09-10 ENCOUNTER — Encounter (HOSPITAL_COMMUNITY)
Admission: RE | Disposition: A | Discharge status: Home / Self Care | Payer: Self-pay | Source: Home / Self Care | Attending: Internal Medicine

## 2022-09-10 ENCOUNTER — Ambulatory Visit (HOSPITAL_COMMUNITY): Payer: Medicare Other | Admitting: Anesthesiology

## 2022-09-10 ENCOUNTER — Ambulatory Visit (HOSPITAL_BASED_OUTPATIENT_CLINIC_OR_DEPARTMENT_OTHER): Payer: Medicare Other | Admitting: Anesthesiology

## 2022-09-10 DIAGNOSIS — M797 Fibromyalgia: Secondary | ICD-10-CM | POA: Diagnosis not present

## 2022-09-10 DIAGNOSIS — R1031 Right lower quadrant pain: Secondary | ICD-10-CM

## 2022-09-10 DIAGNOSIS — K5730 Diverticulosis of large intestine without perforation or abscess without bleeding: Secondary | ICD-10-CM

## 2022-09-10 DIAGNOSIS — D123 Benign neoplasm of transverse colon: Secondary | ICD-10-CM

## 2022-09-10 DIAGNOSIS — K219 Gastro-esophageal reflux disease without esophagitis: Secondary | ICD-10-CM | POA: Diagnosis not present

## 2022-09-10 DIAGNOSIS — I10 Essential (primary) hypertension: Secondary | ICD-10-CM | POA: Insufficient documentation

## 2022-09-10 DIAGNOSIS — Z1211 Encounter for screening for malignant neoplasm of colon: Secondary | ICD-10-CM

## 2022-09-10 DIAGNOSIS — F32A Depression, unspecified: Secondary | ICD-10-CM | POA: Insufficient documentation

## 2022-09-10 DIAGNOSIS — K648 Other hemorrhoids: Secondary | ICD-10-CM | POA: Diagnosis not present

## 2022-09-10 DIAGNOSIS — J45909 Unspecified asthma, uncomplicated: Secondary | ICD-10-CM | POA: Insufficient documentation

## 2022-09-10 DIAGNOSIS — F419 Anxiety disorder, unspecified: Secondary | ICD-10-CM | POA: Insufficient documentation

## 2022-09-10 DIAGNOSIS — Z87891 Personal history of nicotine dependence: Secondary | ICD-10-CM | POA: Insufficient documentation

## 2022-09-10 DIAGNOSIS — Z8673 Personal history of transient ischemic attack (TIA), and cerebral infarction without residual deficits: Secondary | ICD-10-CM | POA: Diagnosis not present

## 2022-09-10 DIAGNOSIS — D120 Benign neoplasm of cecum: Secondary | ICD-10-CM

## 2022-09-10 DIAGNOSIS — K635 Polyp of colon: Secondary | ICD-10-CM | POA: Diagnosis not present

## 2022-09-10 DIAGNOSIS — K6389 Other specified diseases of intestine: Secondary | ICD-10-CM | POA: Diagnosis not present

## 2022-09-10 DIAGNOSIS — K5900 Constipation, unspecified: Secondary | ICD-10-CM

## 2022-09-10 HISTORY — PX: POLYPECTOMY: SHX5525

## 2022-09-10 HISTORY — PX: COLONOSCOPY WITH PROPOFOL: SHX5780

## 2022-09-10 SURGERY — COLONOSCOPY WITH PROPOFOL
Anesthesia: General

## 2022-09-10 MED ORDER — LACTATED RINGERS IV SOLN: INTRAVENOUS | Status: DC

## 2022-09-10 MED ORDER — PROPOFOL 10 MG/ML IV BOLUS
INTRAVENOUS | Status: DC | PRN
  Administered 2022-09-10: 50 mg via INTRAVENOUS
  Administered 2022-09-10: 100 mg via INTRAVENOUS
  Administered 2022-09-10 (×2): 50 mg via INTRAVENOUS

## 2022-09-10 MED ORDER — LIDOCAINE HCL (CARDIAC) PF 100 MG/5ML IV SOSY
PREFILLED_SYRINGE | INTRAVENOUS | Status: DC | PRN
  Administered 2022-09-10: 50 mg via INTRAVENOUS

## 2022-09-10 NOTE — Op Note (Signed)
[facility] Patient Name: [name] Procedure Date: 09/10/2022 7:36 AM MRN: [id number] Date of Birth: 08-Oct-1955 Attending MD: Elon Alas. Abbey Chatters , [place], [id number] CSN: [id number] Age: 67 Admit Type: Outpatient Procedure:                Colonoscopy Indications:              Screening for colorectal malignant neoplasm Providers:                Elon Alas. Abbey Chatters, DO, Tammy Vaught, RN, Raphael Gibney, Technician Referring MD:              Medicines:                See the Anesthesia note for documentation of the                            administered medications Complications:            No immediate complications. Estimated Blood Loss:     Estimated blood loss was minimal. Procedure:                Pre-Anesthesia Assessment:                           - The anesthesia plan was to use monitored                            anesthesia care (MAC).                           After obtaining informed consent, the colonoscope                            was passed under direct vision. Throughout the                            procedure, the patient's blood pressure, pulse, and                            oxygen saturations were monitored continuously. The                            [id number]-[id number] ([id number]) scope was introduced through                            the anus and advanced to the the cecum, identified                            by appendiceal orifice and ileocecal valve. The                            colonoscopy was performed without difficulty. The                            patient tolerated the procedure well.  The quality                            of the bowel preparation was evaluated using the                            BBPS [facility] Bowel Preparation Scale) with scores                            of: Right Colon = 3, Transverse Colon = 3 and Left                            Colon = 3 (entire mucosa seen well with no residual                             staining, small fragments of stool or opaque                            liquid). The total BBPS score equals 9. Scope In: 7:40:34 AM Scope Out: 7:59:28 AM Scope Withdrawal Time: 0 hours 14 minutes 7 seconds  Total Procedure Duration: 0 hours 18 minutes 54 seconds  Findings:      Non-bleeding internal hemorrhoids were found during endoscopy.      Multiple large-mouthed and small-mouthed diverticula were found in the       sigmoid colon and descending colon.      A 3 mm polyp was found in the cecum. The polyp was sessile. The polyp       was removed with a cold snare. Resection and retrieval were complete.      A 3 mm polyp was found in the transverse colon. The polyp was sessile.       The polyp was removed with a cold snare. Resection and retrieval were       complete.      The exam was otherwise without abnormality. Impression:               - Non-bleeding internal hemorrhoids.                           - Diverticulosis in the sigmoid colon and in the                            descending colon.                           - One 3 mm polyp in the cecum, removed with a cold                            snare. Resected and retrieved.                           - One 3 mm polyp in the transverse colon, removed                            with a cold snare. Resected and retrieved.                           -  The examination was otherwise normal. Moderate Sedation:      Per Anesthesia Care Recommendation:           - Patient has a contact number available for                            emergencies. The signs and symptoms of potential                            delayed complications were discussed with the                            patient. Return to normal activities tomorrow.                            Written discharge instructions were provided to the                            patient.                           - Resume previous diet.                           - Continue present  medications.                           - Await pathology results.                           - Repeat colonoscopy in 7-10 years for surveillance.                           - Return to GI clinic in 3 months. Procedure Code(s):        --- Professional ---                           [phone], Colonoscopy, flexible; with removal of                            tumor(s), polyp(s), or other lesion(s) by snare                            technique Diagnosis Code(s):        --- Professional ---                           Z12.11, Encounter for screening for malignant                            neoplasm of colon                           K64.8, Other hemorrhoids                           D12.0, Benign neoplasm of cecum  D12.3, Benign neoplasm of transverse colon (hepatic                            flexure or splenic flexure)                           K57.30, Diverticulosis of large intestine without                            perforation or abscess without bleeding CPT copyright 2022 American Medical Association. All rights reserved. The codes documented in this report are preliminary and upon coder review may  be revised to meet current compliance requirements. Elon Alas. Abbey Chatters, DO [place] Abbey Chatters, DO 09/10/2022 8:09:53 AM This report has been signed electronically. Number of Addenda: 0

## 2022-09-10 NOTE — H&P (Signed)
Primary Care Physician:  Asencion Noble, MD Primary Gastroenterologist:  Dr. Abbey Chatters  Pre-Procedure History & Physical: HPI:  [name] is a 67 y.o. female is here for a colonoscopy for colon cancer screening purposes.  Patient denies any family history of colorectal cancer.  No melena or hematochezia.  No abdominal pain or unintentional weight loss.  No change in bowel habits.  Overall feels well from a GI standpoint.  Past Medical History:  Diagnosis Date   Abnormal Pap smear of cervix    Anxiety    Arthritis    Asthma    Depression    Essential hypertension    Fibromyalgia    GERD (gastroesophageal reflux disease)    History of stroke    PVC's (premature ventricular contractions)    Sciatica of right side    Stroke ([place])    STROKE IN RIGHT EYE 20 YEAS AGO O PROBLEMS SINCE     Past Surgical History:  Procedure Laterality Date   24 HOUR [place] STUDY  10/31/2020   Procedure: 24 HOUR [place] STUDY;  Surgeon: Mauri Pole, MD;  Location: [facility];  Service: Endoscopy;;   ADENOIDECTOMY     APPENDECTOMY     BUNIONECTOMY Bilateral    CHONDROPLASTY Right 06/19/2018   Procedure: KNEE ARTHROSCOPY WITH chondroplasty;  Surgeon: Carole Civil, MD;  Location: [facility];  Service: Orthopedics;  Laterality: Right;   COLONOSCOPY  10/08/2011   diverticulosis, hyperplastic polyp removed. next tcs 2023.   COLONOSCOPY WITH PROPOFOL N/A 04/30/2022   Procedure: COLONOSCOPY WITH PROPOFOL;  Surgeon: Eloise Harman, DO;  Location: [facility];  Service: Endoscopy;  Laterality: N/A;  1:15 pm   ESOPHAGEAL MANOMETRY N/A 10/31/2020   Procedure: ESOPHAGEAL MANOMETRY (EM);  Surgeon: Mauri Pole, MD;  Location: [facility];  Service: Endoscopy;  Laterality: N/A;   ESOPHAGOGASTRODUODENOSCOPY N/A 03/12/2019   (EGD) with pyloric dilation;  Surgeon: Danie Binder, MD; nonobstructing Schatzki ring s/p dilation, moderate sized hiatal hernia, mild gastritis s/p biopsy, single gastric polyp  s/p biopsy, possible gastroparesis with retained gastric contents.  Pathology benign with no H. pylori.   ESOPHAGOGASTRODUODENOSCOPY (EGD) WITH PROPOFOL N/A 08/24/2019   normal stomach, low-grade Schatzki ring s/p dilation, LA Grade A reflux esophagitis, s/p biopsy. Normal esophageal biopsies   FOOT SURGERY  08/2018   FOOT SURGERY  05/14/2019   KNEE SURGERY  05/2018   PH IMPEDANCE STUDY N/A 10/31/2020   Procedure: [place] IMPEDANCE STUDY;  Surgeon: Mauri Pole, MD;  Location: [facility];  Service: Endoscopy;  Laterality: N/A;   POLYPECTOMY  03/12/2019   Procedure: POLYPECTOMY;  Surgeon: Danie Binder, MD;  [facility]: [facility];  Service: Endoscopy;;   TONSILLECTOMY     TOTAL HIP ARTHROPLASTY Right 07/08/2020   Procedure: TOTAL HIP ARTHROPLASTY;  Surgeon: Earlie Server, MD;  Location: [facility];  Service: Orthopedics;  Laterality: Right;   TUBAL LIGATION     WRIST SURGERY      Prior to Admission medications   Medication Sig Start Date End Date Taking? Authorizing Provider  ALPRAZolam Duanne Moron) 1 MG tablet Take 1 mg by mouth at bedtime. 06/15/22  Yes [provider]  amLODipine (NORVASC) 5 MG tablet Take 5 mg by mouth daily. 07/04/17  Yes [provider]  budesonide-formoterol (SYMBICORT) 160-4.5 MCG/ACT inhaler Inhale 2 puffs into the lungs 2 (two) times daily.   Yes [provider]  EPINEPHrine 0.3 mg/0.3 mL IJ SOAJ injection Inject 0.3 mg into the muscle as needed for anaphylaxis.  Yes [provider]  guaiFENesin (MUCINEX) 600 MG 12 hr tablet Take 600 mg by mouth 2 (two) times daily.   Yes [provider]  levocetirizine (XYZAL) 5 MG tablet Take 5 mg by mouth daily. 04/06/22  Yes [provider]  lubiprostone (AMITIZA) 24 MCG capsule Take 24 mcg by mouth daily with breakfast. 07/11/20  Yes [provider]  pantoprazole (PROTONIX) 40 MG tablet Take 40 mg by mouth 2 (two) times daily. 05/21/22  Yes [provider]   PARoxetine (PAXIL) 10 MG tablet Take 10 mg by mouth daily. 06/12/21  Yes [provider]  Prenatal Vit-Fe Fumarate-FA (MULTIVITAMIN-PRENATAL) 27-0.8 MG TABS tablet Take 1 tablet by mouth daily at 12 noon.   Yes [provider]  PROAIR HFA 108 (90 Base) MCG/ACT inhaler Inhale 2 puffs into the lungs every 6 (six) hours as needed for wheezing or shortness of breath.   Yes [provider]  RABEprazole (ACIPHEX) 20 MG tablet Take 1 tablet (20 mg total) by mouth 2 (two) times daily before a meal. 04/09/22  Yes Jodi Mourning, Kristen S, PA-C  rosuvastatin (CRESTOR) 20 MG tablet Take 20 mg by mouth at bedtime. 03/26/22  Yes [provider]  topiramate (TOPAMAX) 200 MG tablet Take 200 mg by mouth 2 (two) times daily.    Yes [provider]  traZODone (DESYREL) 50 MG tablet Take 25-50 mg by mouth at bedtime as needed for sleep. 04/06/22  Yes [provider]  VITAMIN D PO Take 1 tablet by mouth daily.   Yes [provider]  HYDROmorphone (DILAUDID) 4 MG tablet Take 1 every 6-8 hours for pain not relieved by Motrin alone Patient not taking: Reported on 08/30/2022 06/14/22   Milton Ferguson, MD    Allergies as of 07/30/2022 - Review Complete 07/30/2022  Allergen Reaction Noted   Alpha-gal  07/30/2022   Percocet [oxycodone-acetaminophen] Nausea And Vomiting 06/14/2022    Family History  Problem Relation Age of Onset   Hypertension Mother    Diverticulosis Mother    Hypertension Father    AAA (abdominal aortic aneurysm) Father    Cancer Brother        not sure where primary, was metastatic. was jaundice.    COPD Brother    Angioedema Neg Hx    Atopy Neg Hx    Eczema Neg Hx    Immunodeficiency Neg Hx    Urticaria Neg Hx    Asthma Neg Hx    Allergic rhinitis Neg Hx    Colon cancer Neg Hx     Social History   Socioeconomic History   Marital status: Married    Spouse name: Not on file   Number of children: Not on file   Years of education:  Not on file   Highest education level: Not on file  Occupational History   Not on file  Tobacco Use   Smoking status: Former    Packs/day: 0.25    Years: 1.00    Total pack years: 0.25    Types: Cigarettes    Quit date: 06/17/1975    Years since quitting: 47.2   Smokeless tobacco: Never  Vaping Use   Vaping Use: Never used  Substance and Sexual Activity   Alcohol use: No   Drug use: No   Sexual activity: Not Currently    Birth control/protection: Surgical, Post-menopausal    Comment: tubal  Other Topics Concern   Not on file  Social History Narrative   Not on file  Social Determinants of Health   Financial Resource Strain: Low Risk  (07/27/2021)   Overall Financial Resource Strain (CARDIA)    Difficulty of Paying Living Expenses: Not very hard  Food Insecurity: No Food Insecurity (07/27/2021)   Hunger Vital Sign    Worried About Running Out of Food in the Last Year: Never true    Ran Out of Food in the Last Year: Never true  Transportation Needs: No Transportation Needs (07/27/2021)   PRAPARE - Hydrologist (Medical): No    Lack of Transportation (Non-Medical): No  Physical Activity: Insufficiently Active (07/27/2021)   Exercise Vital Sign    Days of Exercise per Week: 3 days    Minutes of Exercise per Session: 20 min  Stress: No Stress Concern Present (07/27/2021)   [place]    Feeling of Stress : Only a little  Social Connections: Moderately Integrated (07/27/2021)   Social Connection and Isolation Panel [NHANES]    Frequency of Communication with Friends and Family: More than three times a week    Frequency of Social Gatherings with Friends and Family: Twice a week    Attends Religious Services: More than 4 times per year    Active Member of Clubs or Organizations: Yes    Attends Archivist Meetings: Never    Marital Status: Divorced  Human resources officer Violence:  Not At Risk (07/27/2021)   Humiliation, Afraid, Rape, and Kick questionnaire    Fear of Current or Ex-Partner: No    Emotionally Abused: No    Physically Abused: No    Sexually Abused: No    Review of Systems: See HPI, otherwise negative ROS  Physical Exam: Vital signs in last 24 hours: Temp:  [97.9 F (36.6 C)] 97.9 F (36.6 C) (01/22 0706) Pulse Rate:  [88-101] 88 (01/22 0714) Resp:  [20] 20 (01/22 0706) BP: (154-169)/(70-105) 169/105 (01/22 0714) SpO2:  [98 %] 98 % (01/22 0706) Weight:  [79.8 kg] 79.8 kg (01/22 0706)   General:   Alert,  Well-developed, well-nourished, pleasant and cooperative in NAD Head:  Normocephalic and atraumatic. Eyes:  Sclera clear, no icterus.   Conjunctiva pink. Ears:  Normal auditory acuity. Nose:  No deformity, discharge,  or lesions. Msk:  Symmetrical without gross deformities. Normal posture. Extremities:  Without clubbing or edema. Neurologic:  Alert and  oriented x4;  grossly normal neurologically. Skin:  Intact without significant lesions or rashes. Psych:  Alert and cooperative. Normal mood and affect.  Impression/Plan: [name] is here for a colonoscopy to be performed for colon cancer screening purposes.  The risks of the procedure including infection, bleed, or perforation as well as benefits, limitations, alternatives and imponderables have been reviewed with the patient. Questions have been answered. All parties agreeable.

## 2022-09-10 NOTE — Anesthesia Postprocedure Evaluation (Signed)
Anesthesia Post Note  Patient: [name]  Procedure(s) Performed: COLONOSCOPY WITH PROPOFOL POLYPECTOMY  Patient location during evaluation: Phase II Anesthesia Type: General Level of consciousness: awake and alert and oriented Pain management: pain level controlled Vital Signs Assessment: post-procedure vital signs reviewed and stable Respiratory status: spontaneous breathing, nonlabored ventilation and respiratory function stable Cardiovascular status: blood pressure returned to baseline and stable Postop Assessment: no apparent nausea or vomiting Anesthetic complications: no  No notable events documented.   Last Vitals:  Vitals:   09/10/22 0714 09/10/22 0800  BP: (!) 169/105 (!) 137/97  Pulse: 88 85  Resp:  16  Temp:  36.6 C  SpO2:  97%    Last Pain:  Vitals:   09/10/22 0800  TempSrc: Oral  PainSc: 0-No pain                 Eulala Newcombe C Jadeyn Hargett

## 2022-09-10 NOTE — Anesthesia Preprocedure Evaluation (Signed)
Anesthesia Evaluation  Patient identified by MRN, date of birth, ID band Patient awake    Reviewed: Allergy & Precautions, NPO status , Patient's Chart, lab work & pertinent test results  Airway Mallampati: II  TM Distance: >3 FB Neck ROM: Full    Dental  (+) Dental Advisory Given, Missing,    Pulmonary asthma , former smoker   Pulmonary exam normal breath sounds clear to auscultation       Cardiovascular hypertension, Pt. on medications Normal cardiovascular exam Rhythm:Regular Rate:Normal     Neuro/Psych  PSYCHIATRIC DISORDERS Anxiety Depression     Neuromuscular disease CVA (right vision loss), Residual Symptoms    GI/Hepatic Neg liver ROS,GERD  Medicated and Controlled,,  Endo/Other  negative endocrine ROS    Renal/GU negative Renal ROS  negative genitourinary   Musculoskeletal  (+) Arthritis , Osteoarthritis,  Fibromyalgia -  Abdominal   Peds negative pediatric ROS (+)  Hematology negative hematology ROS (+)   Anesthesia Other Findings   Reproductive/Obstetrics negative OB ROS                             Anesthesia Physical Anesthesia Plan  ASA: 3  Anesthesia Plan: General   Post-op Pain Management: Minimal or no pain anticipated   Induction: Intravenous  PONV Risk Score and Plan: Propofol infusion  Airway Management Planned: Nasal Cannula and Natural Airway  Additional Equipment:   Intra-op Plan:   Post-operative Plan:   Informed Consent: I have reviewed the patients History and Physical, chart, labs and discussed the procedure including the risks, benefits and alternatives for the proposed anesthesia with the patient or authorized representative who has indicated his/her understanding and acceptance.     Dental advisory given  Plan Discussed with: CRNA and Surgeon  Anesthesia Plan Comments:         Anesthesia Quick Evaluation

## 2022-09-10 NOTE — Transfer of Care (Signed)
Immediate Anesthesia Transfer of Care Note  Patient: [name]  Procedure(s) Performed: COLONOSCOPY WITH PROPOFOL POLYPECTOMY  Patient Location: Short Stay  Anesthesia Type:General  Level of Consciousness: awake, alert , and oriented  Airway & Oxygen Therapy: Patient Spontanous Breathing  Post-op Assessment: Report given to RN and Post -op Vital signs reviewed and stable  Post vital signs: Reviewed and stable  Last Vitals:  Vitals Value Taken Time  BP 137/97 09/10/22 0800  Temp 36.6 C 09/10/22 0800  Pulse 85 09/10/22 0800  Resp 16 09/10/22 0800  SpO2 97 % 09/10/22 0800    Last Pain:  Vitals:   09/10/22 0800  TempSrc: Oral  PainSc: 0-No pain         Complications: No notable events documented.

## 2022-09-10 NOTE — Discharge Instructions (Addendum)
  Colonoscopy Discharge Instructions  Read the instructions outlined below and refer to this sheet in the next few weeks. These discharge instructions provide you with general information on caring for yourself after you leave the hospital. Your doctor may also give you specific instructions. While your treatment has been planned according to the most current medical practices available, unavoidable complications occasionally occur.   ACTIVITY You may resume your regular activity, but move at a slower pace for the next 24 hours.  Take frequent rest periods for the next 24 hours.  Walking will help get rid of the air and reduce the bloated feeling in your belly (abdomen).  No driving for 24 hours (because of the medicine (anesthesia) used during the test).   Do not sign any important legal documents or operate any machinery for 24 hours (because of the anesthesia used during the test).  NUTRITION Drink plenty of fluids.  You may resume your normal diet as instructed by your doctor.  Begin with a light meal and progress to your normal diet. Heavy or fried foods are harder to digest and may make you feel sick to your stomach (nauseated).  Avoid alcoholic beverages for 24 hours or as instructed.  MEDICATIONS You may resume your normal medications unless your doctor tells you otherwise.  WHAT YOU CAN EXPECT TODAY Some feelings of bloating in the abdomen.  Passage of more gas than usual.  Spotting of blood in your stool or on the toilet paper.  IF YOU HAD POLYPS REMOVED DURING THE COLONOSCOPY: No aspirin products for 7 days or as instructed.  No alcohol for 7 days or as instructed.  Eat a soft diet for the next 24 hours.  FINDING OUT THE RESULTS OF YOUR TEST Not all test results are available during your visit. If your test results are not back during the visit, make an appointment with your caregiver to find out the results. Do not assume everything is normal if you have not heard from your  caregiver or the medical facility. It is important for you to follow up on all of your test results.  SEEK IMMEDIATE MEDICAL ATTENTION IF: You have more than a spotting of blood in your stool.  Your belly is swollen (abdominal distention).  You are nauseated or vomiting.  You have a temperature over 101.  You have abdominal pain or discomfort that is severe or gets worse throughout the day.   Your colonoscopy revealed 2 polyp(s) which I removed successfully. Await pathology results, my office will contact you. I recommend repeating colonoscopy in 7-10 years for surveillance purposes.   You also have diverticulosis and internal hemorrhoids. I would recommend increasing fiber in your diet or adding OTC Benefiber/Metamucil. Be sure to drink at least 4 to 6 glasses of water daily. Follow-up with GI in 3-4 months   I hope you have a great rest of your week!  Elon Alas. Abbey Chatters, D.O. Gastroenterology and Hepatology [facility]

## 2022-09-11 LAB — SURGICAL PATHOLOGY

## 2022-09-14 ENCOUNTER — Encounter (HOSPITAL_COMMUNITY): Payer: Self-pay | Admitting: Internal Medicine

## 2022-10-03 DIAGNOSIS — M7061 Trochanteric bursitis, right hip: Secondary | ICD-10-CM | POA: Diagnosis not present

## 2022-10-25 ENCOUNTER — Encounter: Payer: Self-pay | Admitting: Internal Medicine

## 2022-10-31 DIAGNOSIS — I10 Essential (primary) hypertension: Secondary | ICD-10-CM | POA: Diagnosis not present

## 2022-10-31 DIAGNOSIS — E785 Hyperlipidemia, unspecified: Secondary | ICD-10-CM | POA: Diagnosis not present

## 2022-10-31 DIAGNOSIS — G4700 Insomnia, unspecified: Secondary | ICD-10-CM | POA: Diagnosis not present

## 2022-10-31 DIAGNOSIS — N1831 Chronic kidney disease, stage 3a: Secondary | ICD-10-CM | POA: Diagnosis not present

## 2022-10-31 DIAGNOSIS — Z79899 Other long term (current) drug therapy: Secondary | ICD-10-CM | POA: Diagnosis not present

## 2022-11-07 ENCOUNTER — Other Ambulatory Visit (HOSPITAL_COMMUNITY): Payer: Self-pay | Admitting: Internal Medicine

## 2022-11-07 DIAGNOSIS — R7989 Other specified abnormal findings of blood chemistry: Secondary | ICD-10-CM

## 2022-11-07 DIAGNOSIS — I10 Essential (primary) hypertension: Secondary | ICD-10-CM | POA: Diagnosis not present

## 2022-11-07 DIAGNOSIS — N1831 Chronic kidney disease, stage 3a: Secondary | ICD-10-CM | POA: Diagnosis not present

## 2022-11-07 DIAGNOSIS — R5383 Other fatigue: Secondary | ICD-10-CM | POA: Diagnosis not present

## 2022-11-07 DIAGNOSIS — N181 Chronic kidney disease, stage 1: Secondary | ICD-10-CM | POA: Diagnosis not present

## 2022-11-12 ENCOUNTER — Other Ambulatory Visit: Payer: Self-pay | Admitting: Gastroenterology

## 2022-11-12 ENCOUNTER — Other Ambulatory Visit: Payer: Self-pay | Admitting: Adult Health

## 2022-11-12 DIAGNOSIS — K219 Gastro-esophageal reflux disease without esophagitis: Secondary | ICD-10-CM

## 2022-11-19 ENCOUNTER — Ambulatory Visit (HOSPITAL_COMMUNITY)
Admission: RE | Admit: 2022-11-19 | Discharge: 2022-11-19 | Disposition: A | Discharge status: Home / Self Care | Payer: Medicare Other | Source: Ambulatory Visit | Attending: Internal Medicine | Admitting: Internal Medicine

## 2022-11-19 DIAGNOSIS — R7989 Other specified abnormal findings of blood chemistry: Secondary | ICD-10-CM | POA: Insufficient documentation

## 2022-11-19 DIAGNOSIS — R944 Abnormal results of kidney function studies: Secondary | ICD-10-CM | POA: Diagnosis not present

## 2022-11-19 DIAGNOSIS — N2889 Other specified disorders of kidney and ureter: Secondary | ICD-10-CM | POA: Diagnosis not present

## 2022-11-19 DIAGNOSIS — R748 Abnormal levels of other serum enzymes: Secondary | ICD-10-CM | POA: Insufficient documentation

## 2022-11-20 ENCOUNTER — Encounter: Payer: Self-pay | Admitting: Adult Health

## 2022-11-20 ENCOUNTER — Ambulatory Visit: Payer: Medicare Other | Admitting: Adult Health

## 2022-11-20 VITALS — BP 156/96 | HR 69 | Ht 68.0 in | Wt 183.0 lb

## 2022-11-20 DIAGNOSIS — M797 Fibromyalgia: Secondary | ICD-10-CM | POA: Diagnosis not present

## 2022-11-20 DIAGNOSIS — Z713 Dietary counseling and surveillance: Secondary | ICD-10-CM

## 2022-11-20 DIAGNOSIS — R635 Abnormal weight gain: Secondary | ICD-10-CM | POA: Diagnosis not present

## 2022-11-20 NOTE — Progress Notes (Signed)
  Subjective:     Patient ID: [name], female   DOB: 03-Apr-1956, 67 y.o.   MRN: [id number]  HPI [name] is a 67 year old white female, married, PM in complaining of weight gain and more body aches due to fibromyalgia when weight is up, she requests phentermine. She has seen Dr Willey Blade recently she says and BP was good, but she did not take meds this morning and was rushing.  Last pap was negative HPV, NILM 07/27/21.  PCP is Dr Willey Blade   Review of Systems +weight gain +body aches Reviewed past medical,surgical, social and family history. Reviewed medications and allergies.     Objective:   Physical Exam BP (!) 156/96 (BP Location: Right Arm, Patient Position: Sitting, Cuff Size: Normal)   Pulse 69   Ht 5\' 8"  (1.727 m)   Wt 183 lb (83 kg)   LMP 12/13/2015   BMI 27.83 kg/m     Skin warm and dry.  Lungs: clear to ausculation bilaterally. Cardiovascular: regular rate and rhythm.  Fall risk is low  Upstream - 11/20/22 0841       Pregnancy Intention Screening   Does the patient want to become pregnant in the next year? No    Does the patient's partner want to become pregnant in the next year? No    Would the patient like to discuss contraceptive options today? No      Contraception Wrap Up   Current Method Female Sterilization    End Method Female Sterilization    Contraception Counseling Provided No             Assessment:     1. Weight gain Has gained some weight She requests phentermine, but BP was elevated today Keep BP log for 1 week and let me know next week BP numbers May rx phentermine then   2. Fibromyalgia Has more body aches when weight up  3. Weight loss counseling, encounter for Watch carbs Increase water Walk every day     Plan:     Follow up prn

## 2022-11-30 ENCOUNTER — Ambulatory Visit (INDEPENDENT_AMBULATORY_CARE_PROVIDER_SITE_OTHER): Payer: Medicare Other | Admitting: *Deleted

## 2022-11-30 VITALS — BP 143/87 | HR 74

## 2022-11-30 DIAGNOSIS — M25551 Pain in right hip: Secondary | ICD-10-CM | POA: Diagnosis not present

## 2022-11-30 DIAGNOSIS — Z96641 Presence of right artificial hip joint: Secondary | ICD-10-CM | POA: Diagnosis not present

## 2022-11-30 DIAGNOSIS — I10 Essential (primary) hypertension: Secondary | ICD-10-CM

## 2022-11-30 NOTE — Progress Notes (Signed)
   NURSE VISIT- BLOOD PRESSURE CHECK  SUBJECTIVE:  [name] is a 67 y.o. [id number] female here for BP check. She is a GYN patient. States she does not have a way to check her blood pressure at home and just left a funeral and thought she would "stop by real quick" to have [place] check it. Says when she goes to her PCP, blood pressure is always good.  HYPERTENSION ROS:   GYN patient: Taking medicines as instructed yes Headaches  No Chest pain No Shortness of breath No Swelling in legs/ankles No  OBJECTIVE:  BP (!) 143/87 (BP Location: Right Arm, Patient Position: Sitting, Cuff Size: Normal)   Pulse 74   LMP 12/13/2015   Appearance alert, well appearing, and in no distress and oriented to person, place, and time.  ASSESSMENT: GYN  blood pressure check  PLAN: Discussed with Cyril Mourning, AGNP   Recommendations: Reduce salt and red bull.  Can have Dr Alonza Smoker office check several times and send [place] readings and can make f/u bp check here in our office in 2 weeks.    Debbe Odea Sathvik Tiedt  11/30/2022 12:21 PM

## 2022-12-05 ENCOUNTER — Other Ambulatory Visit (HOSPITAL_COMMUNITY): Payer: Self-pay | Admitting: Orthopedic Surgery

## 2022-12-05 DIAGNOSIS — Z96641 Presence of right artificial hip joint: Secondary | ICD-10-CM

## 2022-12-11 ENCOUNTER — Encounter (HOSPITAL_COMMUNITY)
Admission: RE | Admit: 2022-12-11 | Discharge: 2022-12-11 | Disposition: A | Discharge status: Home / Self Care | Payer: Medicare Other | Source: Ambulatory Visit | Attending: Orthopedic Surgery | Admitting: Orthopedic Surgery

## 2022-12-11 DIAGNOSIS — Z96641 Presence of right artificial hip joint: Secondary | ICD-10-CM | POA: Diagnosis not present

## 2022-12-11 MED ORDER — TECHNETIUM TC 99M MEDRONATE IV KIT
20.0000 | PACK | Freq: Once | INTRAVENOUS | Status: AC | PRN
  Administered 2022-12-11: 20 via INTRAVENOUS

## 2023-01-04 ENCOUNTER — Telehealth: Payer: Self-pay | Admitting: Adult Health

## 2023-01-04 MED ORDER — PHENTERMINE HCL 37.5 MG PO TABS: 37.5000 mg | ORAL_TABLET | Freq: Every day | ORAL | 0 refills | Status: DC

## 2023-01-04 NOTE — Telephone Encounter (Signed)
BP is 130/80 on several checks, will rx phentermine

## 2023-01-04 NOTE — Telephone Encounter (Signed)
Left message to call me back.

## 2023-03-22 ENCOUNTER — Encounter: Payer: Self-pay | Admitting: Gastroenterology

## 2023-03-22 ENCOUNTER — Ambulatory Visit (INDEPENDENT_AMBULATORY_CARE_PROVIDER_SITE_OTHER): Payer: Medicare Other | Admitting: Gastroenterology

## 2023-03-22 VITALS — BP 111/76 | HR 94 | Temp 97.5°F | Ht 68.0 in | Wt 192.0 lb

## 2023-03-22 DIAGNOSIS — A09 Infectious gastroenteritis and colitis, unspecified: Secondary | ICD-10-CM | POA: Diagnosis not present

## 2023-03-22 DIAGNOSIS — K2100 Gastro-esophageal reflux disease with esophagitis, without bleeding: Secondary | ICD-10-CM

## 2023-03-22 MED ORDER — ESOMEPRAZOLE MAGNESIUM 40 MG PO CPDR: 40.0000 mg | DELAYED_RELEASE_CAPSULE | Freq: Two times a day (BID) | ORAL | 5 refills | Status: DC

## 2023-03-22 NOTE — Progress Notes (Signed)
GI Office Note    Referring Provider: Carylon Perches, MD Primary Care Physician:  Carylon Perches, MD  Primary Gastroenterologist: Hennie Duos. Marletta Lor, DO   Chief Complaint   Chief Complaint  Patient presents with   Diarrhea   Gastroesophageal Reflux    History of Present Illness   [name] is a 67 y.o. female presenting today for  Last seen in 07/2022. She has h/o GERD, constipation, Alpha-Gal (diagnosed in 04/2022).   Patient was doing well up until about 2 weeks ago.  She started having frequent heartburn, indigestion.  She has been maintained on rabeprazole 20 mg twice daily chronically.  States she has been compliant with medication.  Denies any other recent medication changes or dietary changes.  She continues to avoid red meat.  She feels like she has a blowtorch in her chest.  She denies any shortness of breath or diaphoresis.  Denies abdominal pain.  No dysphagia.  No vomiting.  She does note that her stools have changed over the same period of time.  She is having constant stools.  Really cannot quantify how many per day.  Every time she eats she has diarrhea.  No solid stools.  Complains of nocturnal stools.  No melena or rectal bleeding.  Currently Amitiza on hold.  Denies any recent travel, ill contacts, antibiotics.  No weight loss.  She notes that she needs to lose some weight.  She currently takes phentermine off and on for her fibromyalgia.  States her retired gynecologist gave it to her for this purpose but she does not have ongoing RX so she cannot take daily for weight loss.   Labs from March 2024: TSH 4.5, total bilirubin 0.3, alkaline phosphatase 66, AST 19, ALT 17, creatinine 1.42, albumin 4.1, white blood cell count 6800, hemoglobin 12.2, hematocrit 32.5.     CT A/P with contrast 05/2022 IMPRESSION: -No acute findings in the abdomen or pelvis. -Colonic diverticulosis. -Aortic atherosclerosis.  EGD 08/2019: -low grade narrowing schatzi ring s/p  dilation -LA grade A reflux esophagigits s/p bx benign  Colonoscopy 08/2022: -diverticulosis -one 3mm polyp cecum -one 3mm polyp transverse colon -sessile serrated polyp -colonoscopy in 7 years   Colonoscopy 04/2022 INCOMPLETE - Preparation of the colon was inadequate. - Non-bleeding internal hemorrhoids. - Stool in the transverse colon, in the ascending colon and in the cecum. - No specimens collected. -recommended colonoscopy in six months.   Wt Readings from Last 3 Encounters:  03/22/23 192 lb (87.1 kg)  11/20/22 183 lb (83 kg)  09/10/22 176 lb (79.8 kg)    Medications   Current Outpatient Medications  Medication Sig Dispense Refill   ALPRAZolam (XANAX) 1 MG tablet Take 1 mg by mouth at bedtime.     amLODipine (NORVASC) 5 MG tablet Take 5 mg by mouth daily.  0   budesonide-formoterol (SYMBICORT) 160-4.5 MCG/ACT inhaler Inhale 2 puffs into the lungs 2 (two) times daily.     EPINEPHrine 0.3 mg/0.3 mL IJ SOAJ injection Inject 0.3 mg into the muscle as needed for anaphylaxis.     guaiFENesin (MUCINEX) 600 MG 12 hr tablet Take 600 mg by mouth 2 (two) times daily.     levocetirizine (XYZAL) 5 MG tablet Take 5 mg by mouth daily.     PARoxetine (PAXIL) 10 MG tablet Take 10 mg by mouth daily.     phentermine (ADIPEX-P) 37.5 MG tablet Take 1 tablet (37.5 mg total) by mouth daily before breakfast. Take 1 daily 30 tablet 0  Prenatal Vit-Fe Fumarate-FA (MULTIVITAMIN-PRENATAL) 27-0.8 MG TABS tablet Take 1 tablet by mouth daily at 12 noon.     PROAIR HFA 108 (90 Base) MCG/ACT inhaler Inhale 2 puffs into the lungs every 6 (six) hours as needed for wheezing or shortness of breath.  1   RABEprazole (ACIPHEX) 20 MG tablet TAKE ONE TABLET (20 MG TOTAL) BY MOUTH TWO TIMES DAILY,BEFORE A MEAL. 60 tablet 3   rosuvastatin (CRESTOR) 20 MG tablet Take 20 mg by mouth at bedtime.     topiramate (TOPAMAX) 200 MG tablet Take 200 mg by mouth 2 (two) times daily.      traZODone (DESYREL) 50 MG tablet Take  50 mg by mouth at bedtime.     VITAMIN D PO Take 1 tablet by mouth daily.     lubiprostone (AMITIZA) 24 MCG capsule Take 24 mcg by mouth daily with breakfast. (Patient not taking: Reported on 03/22/2023)     No current facility-administered medications for this visit.    Allergies   Allergies as of 03/22/2023 - Review Complete 03/22/2023  Allergen Reaction Noted   Alpha-gal  07/30/2022   Percocet [oxycodone-acetaminophen] Nausea And Vomiting 06/14/2022     Review of Systems   General: Negative for anorexia, weight loss, fever, chills, fatigue, weakness. ENT: Negative for hoarseness, difficulty swallowing , nasal congestion. CV: Negative for chest pain, angina, palpitations, dyspnea on exertion, peripheral edema.  Respiratory: Negative for dyspnea at rest, dyspnea on exertion, cough, sputum, wheezing.  GI: See history of present illness. GU:  Negative for dysuria, hematuria, urinary incontinence, urinary frequency, nocturnal urination.  Endo: Negative for unusual weight change.     Physical Exam   BP 111/76 (BP Location: Right Arm, Patient Position: Sitting, Cuff Size: Large)   Pulse 94   Temp (!) 97.5 F (36.4 C) (Temporal)   Ht 5\' 8"  (1.727 m)   Wt 192 lb (87.1 kg)   LMP 12/13/2015   SpO2 96%   BMI 29.19 kg/m    General: Well-nourished, well-developed in no acute distress.  Eyes: No icterus. Mouth: Oropharyngeal mucosa moist and pink  Lungs: Clear to auscultation bilaterally.  Heart: Regular rate and rhythm, no murmurs rubs or gallops.  Abdomen: Bowel sounds are normal, nontender, nondistended, no hepatosplenomegaly or masses,  no abdominal bruits or hernia , no rebound or guarding.  Rectal: not performed Extremities: No lower extremity edema. No clubbing or deformities. Neuro: Alert and oriented x 4   Skin: Warm and dry, no jaundice.   Psych: Alert and cooperative, normal mood and affect.  Labs   See hpi  Imaging Studies   No results found.  Assessment    *GERD *Diarrhea  Recent flare of reflux symptoms, no alarm features at this time.  We will try switching medication.  Reinforced antireflux measures.  Acute diarrhea ongoing for more than a week.  Possibly infectious etiology.  Patient interested in weight loss medications. Suggested she followed by gynecology and on PCP to discuss medications used for weight loss as we do not follow patients routinely for weight loss management. I did offer her referral to [facility] but she is not interested at this time.   PLAN   Stool for GI panel and C.diff. Stop rabeprazole.  Start esomeprazole 40 mg twice daily before meals. Reinforced antireflux measures. Progress report next week.   Leanna Battles. Melvyn Neth, MHS, PA-C [facility]

## 2023-03-22 NOTE — Patient Instructions (Signed)
Please check with your gyn or PCP for weight loss management. If you need referral to [facility], I can do that for you! Just let me know! Switch from rabeprazole (Aciphex) to esomeprazole 40mg  twice daily before a meal.  Collect stool testing for infectious cause of your diarrhea.  Please refer to reflux information provided for additional tips to help control your symptoms.  Let us know next week how your symptoms are.  It was a pleasure to see you today. I want to create trusting relationships with patients and provide genuine, compassionate, and quality care. I truly value your feedback, so please be on the lookout for a survey regarding your visit with me today. I appreciate your time in completing this!

## 2023-03-26 DIAGNOSIS — R5383 Other fatigue: Secondary | ICD-10-CM | POA: Diagnosis not present

## 2023-03-26 DIAGNOSIS — N1832 Chronic kidney disease, stage 3b: Secondary | ICD-10-CM | POA: Diagnosis not present

## 2023-03-26 DIAGNOSIS — I10 Essential (primary) hypertension: Secondary | ICD-10-CM | POA: Diagnosis not present

## 2023-03-26 DIAGNOSIS — Z79899 Other long term (current) drug therapy: Secondary | ICD-10-CM | POA: Diagnosis not present

## 2023-04-02 ENCOUNTER — Emergency Department (HOSPITAL_COMMUNITY): Payer: Medicare Other

## 2023-04-02 ENCOUNTER — Other Ambulatory Visit: Payer: Self-pay

## 2023-04-02 ENCOUNTER — Encounter (HOSPITAL_COMMUNITY): Payer: Self-pay | Admitting: Pharmacy Technician

## 2023-04-02 ENCOUNTER — Emergency Department (HOSPITAL_COMMUNITY)
Admission: EM | Admit: 2023-04-02 | Discharge: 2023-04-02 | Disposition: A | Discharge status: Home / Self Care | Payer: Medicare Other | Attending: Emergency Medicine | Admitting: Emergency Medicine

## 2023-04-02 DIAGNOSIS — J40 Bronchitis, not specified as acute or chronic: Secondary | ICD-10-CM | POA: Insufficient documentation

## 2023-04-02 DIAGNOSIS — R0602 Shortness of breath: Secondary | ICD-10-CM | POA: Diagnosis not present

## 2023-04-02 DIAGNOSIS — Z7951 Long term (current) use of inhaled steroids: Secondary | ICD-10-CM | POA: Insufficient documentation

## 2023-04-02 DIAGNOSIS — D72829 Elevated white blood cell count, unspecified: Secondary | ICD-10-CM | POA: Insufficient documentation

## 2023-04-02 DIAGNOSIS — Z79899 Other long term (current) drug therapy: Secondary | ICD-10-CM | POA: Diagnosis not present

## 2023-04-02 DIAGNOSIS — R059 Cough, unspecified: Secondary | ICD-10-CM | POA: Diagnosis not present

## 2023-04-02 DIAGNOSIS — Z1152 Encounter for screening for COVID-19: Secondary | ICD-10-CM | POA: Insufficient documentation

## 2023-04-02 DIAGNOSIS — Z87891 Personal history of nicotine dependence: Secondary | ICD-10-CM | POA: Insufficient documentation

## 2023-04-02 DIAGNOSIS — I10 Essential (primary) hypertension: Secondary | ICD-10-CM | POA: Insufficient documentation

## 2023-04-02 LAB — COMPREHENSIVE METABOLIC PANEL
ALT: 16 U/L (ref 0–44)
AST: 21 U/L (ref 15–41)
Albumin: 3.8 g/dL (ref 3.5–5.0)
Alkaline Phosphatase: 62 U/L (ref 38–126)
Anion gap: 8 (ref 5–15)
BUN: 13 mg/dL (ref 8–23)
CO2: 18 mmol/L — ABNORMAL LOW (ref 22–32)
Calcium: 8.8 mg/dL — ABNORMAL LOW (ref 8.9–10.3)
Chloride: 109 mmol/L (ref 98–111)
Creatinine, Ser: 1.22 mg/dL — ABNORMAL HIGH (ref 0.44–1.00)
GFR, Estimated: 49 mL/min — ABNORMAL LOW (ref >60)
Glucose, Bld: 115 mg/dL — ABNORMAL HIGH (ref 70–99)
Potassium: 4.1 mmol/L (ref 3.5–5.1)
Sodium: 135 mmol/L (ref 135–145)
Total Bilirubin: 0.4 mg/dL (ref 0.3–1.2)
Total Protein: 6.6 g/dL (ref 6.5–8.1)

## 2023-04-02 LAB — CBC WITH DIFFERENTIAL/PLATELET
Abs Immature Granulocytes: 0.04 10*3/uL (ref 0.00–0.07)
Basophils Absolute: 0.1 10*3/uL (ref 0.0–0.1)
Basophils Relative: 1 %
Eosinophils Absolute: 0.6 10*3/uL — ABNORMAL HIGH (ref 0.0–0.5)
Eosinophils Relative: 6 %
HCT: 37.4 % (ref 36.0–46.0)
Hemoglobin: 11.7 g/dL — ABNORMAL LOW (ref 12.0–15.0)
Immature Granulocytes: 0 %
Lymphocytes Relative: 8 %
Lymphs Abs: 0.9 10*3/uL (ref 0.7–4.0)
MCH: 30.3 pg (ref 26.0–34.0)
MCHC: 31.3 g/dL (ref 30.0–36.0)
MCV: 96.9 fL (ref 80.0–100.0)
Monocytes Absolute: 0.8 10*3/uL (ref 0.1–1.0)
Monocytes Relative: 7 %
Neutro Abs: 9 10*3/uL — ABNORMAL HIGH (ref 1.7–7.7)
Neutrophils Relative %: 78 %
Platelets: 283 10*3/uL (ref 150–400)
RBC: 3.86 MIL/uL — ABNORMAL LOW (ref 3.87–5.11)
RDW: 15.3 % (ref 11.5–15.5)
WBC: 11.5 10*3/uL — ABNORMAL HIGH (ref 4.0–10.5)
nRBC: 0 % (ref 0.0–0.2)

## 2023-04-02 LAB — BLOOD GAS, VENOUS
Acid-base deficit: 5.4 mmol/L — ABNORMAL HIGH (ref 0.0–2.0)
Bicarbonate: 19.2 mmol/L — ABNORMAL LOW (ref 20.0–28.0)
Drawn by: 65579
O2 Saturation: 73.8 %
Patient temperature: 36.6
pCO2, Ven: 33 mmHg — ABNORMAL LOW (ref 44–60)
pH, Ven: 7.37 (ref 7.25–7.43)
pO2, Ven: 39 mmHg (ref 32–45)

## 2023-04-02 LAB — RESP PANEL BY RT-PCR (RSV, FLU A&B, COVID)  RVPGX2
Influenza A by PCR: NEGATIVE
Influenza B by PCR: NEGATIVE
Resp Syncytial Virus by PCR: NEGATIVE
SARS Coronavirus 2 by RT PCR: NEGATIVE

## 2023-04-02 LAB — BRAIN NATRIURETIC PEPTIDE: B Natriuretic Peptide: 55 pg/mL (ref 0.0–100.0)

## 2023-04-02 LAB — MAGNESIUM: Magnesium: 2.2 mg/dL (ref 1.7–2.4)

## 2023-04-02 MED ORDER — AZITHROMYCIN 250 MG PO TABS: 250.0000 mg | ORAL_TABLET | Freq: Every day | ORAL | 0 refills | Status: DC

## 2023-04-02 MED ORDER — ALBUTEROL SULFATE (2.5 MG/3ML) 0.083% IN NEBU
10.0000 mg/h | INHALATION_SOLUTION | Freq: Once | RESPIRATORY_TRACT | Status: AC
  Administered 2023-04-02: 10 mg/h via RESPIRATORY_TRACT
  Filled 2023-04-02: qty 3

## 2023-04-02 MED ORDER — PREDNISONE 10 MG PO TABS: 40.0000 mg | ORAL_TABLET | Freq: Every day | ORAL | 0 refills | Status: AC

## 2023-04-02 MED ORDER — METHYLPREDNISOLONE SODIUM SUCC 125 MG IJ SOLR
125.0000 mg | Freq: Once | INTRAMUSCULAR | Status: AC
  Administered 2023-04-02: 125 mg via INTRAVENOUS
  Filled 2023-04-02: qty 2

## 2023-04-02 MED ORDER — ALBUTEROL SULFATE (2.5 MG/3ML) 0.083% IN NEBU
INHALATION_SOLUTION | RESPIRATORY_TRACT | Status: AC
  Filled 2023-04-02: qty 12

## 2023-04-02 NOTE — ED Provider Notes (Signed)
[place] [facility] AT [facility] Provider Note   CSN: [id number] Arrival date & time: 04/02/23  4098     History  Chief Complaint  Patient presents with   Shortness of Breath    [name] is a 67 y.o. female.  HPI Patient presents for cough and shortness of breath.  Onset was 4 days ago.  She does have a history of asthma.  She has been using her home breathing treatments with mild relief.  Symptoms have worsened over the weekend and, for this reason, patient presents to the ED.  Other notable medical history includes anxiety, arthritis, HTN, GERD, fibromyalgia.  She has a remote smoking history.    Home Medications Prior to Admission medications   Medication Sig Start Date End Date Taking? Authorizing Provider  azithromycin (ZITHROMAX) 250 MG tablet Take 1 tablet (250 mg total) by mouth daily. Take first 2 tablets together, then 1 every day until finished. 04/02/23  Yes Gloris Manchester, MD  predniSONE (DELTASONE) 10 MG tablet Take 4 tablets (40 mg total) by mouth daily for 4 days. 04/02/23 04/06/23 Yes Gloris Manchester, MD  ALPRAZolam Prudy Feeler) 1 MG tablet Take 1 mg by mouth at bedtime. 06/15/22   [provider]  amLODipine (NORVASC) 5 MG tablet Take 5 mg by mouth daily. 07/04/17   [provider]  budesonide-formoterol (SYMBICORT) 160-4.5 MCG/ACT inhaler Inhale 2 puffs into the lungs 2 (two) times daily.    [provider]  EPINEPHrine 0.3 mg/0.3 mL IJ SOAJ injection Inject 0.3 mg into the muscle as needed for anaphylaxis.    [provider]  esomeprazole (NEXIUM) 40 MG capsule Take 1 capsule (40 mg total) by mouth 2 (two) times daily before a meal. 03/22/23   Tiffany Kocher, PA-C  guaiFENesin (MUCINEX) 600 MG 12 hr tablet Take 600 mg by mouth 2 (two) times daily.    [provider]  levocetirizine (XYZAL) 5 MG tablet Take 5 mg by mouth daily. 04/06/22   [provider]  lubiprostone (AMITIZA) 24 MCG capsule  Take 24 mcg by mouth daily with breakfast. 07/11/20   [provider]  PARoxetine (PAXIL) 10 MG tablet Take 10 mg by mouth daily. 06/12/21   [provider]  phentermine (ADIPEX-P) 37.5 MG tablet Take 1 tablet (37.5 mg total) by mouth daily before breakfast. Take 1 daily Patient not taking: Reported on 03/22/2023 01/04/23   Adline Potter, NP  Prenatal Vit-Fe Fumarate-FA (MULTIVITAMIN-PRENATAL) 27-0.8 MG TABS tablet Take 1 tablet by mouth daily at 12 noon.    [provider]  PROAIR HFA 108 [phone] Base) MCG/ACT inhaler Inhale 2 puffs into the lungs every 6 (six) hours as needed for wheezing or shortness of breath.    [provider]  rosuvastatin (CRESTOR) 20 MG tablet Take 20 mg by mouth at bedtime. 03/26/22   [provider]  topiramate (TOPAMAX) 200 MG tablet Take 200 mg by mouth 2 (two) times daily.     [provider]  traZODone (DESYREL) 50 MG tablet Take 50 mg by mouth at bedtime. 02/26/23   [provider]  VITAMIN D PO Take 1 tablet by mouth daily.    [provider]      Allergies    Alpha-gal and Percocet [oxycodone-acetaminophen]    Review of Systems   Review of Systems  Respiratory:  Positive for cough, chest tightness and shortness of breath.   All other systems reviewed and are negative.   Physical Exam  Updated Vital Signs BP (!) 178/138   Pulse 83   Temp 97.8 F (36.6 C) (Oral)   Resp (!) 21   LMP 12/13/2015   SpO2 96%  Physical Exam Vitals and nursing note reviewed.  Constitutional:      General: She is not in acute distress.    Appearance: She is well-developed. She is not ill-appearing, toxic-appearing or diaphoretic.  HENT:     Head: Normocephalic and atraumatic.     Mouth/Throat:     Mouth: Mucous membranes are moist.  Eyes:     Conjunctiva/sclera: Conjunctivae normal.  Cardiovascular:     Rate and Rhythm: Normal rate and regular rhythm.     Heart sounds: No murmur heard. Pulmonary:      Effort: Pulmonary effort is normal. Tachypnea present. No accessory muscle usage or respiratory distress.     Breath sounds: Wheezing and rhonchi present.  Chest:     Chest wall: No tenderness.  Abdominal:     Palpations: Abdomen is soft.     Tenderness: There is no abdominal tenderness.  Musculoskeletal:        General: No swelling. Normal range of motion.     Cervical back: Normal range of motion and neck supple.     Right lower leg: No edema.     Left lower leg: No edema.  Skin:    General: Skin is warm and dry.     Coloration: Skin is not cyanotic or pale.  Neurological:     General: No focal deficit present.     Mental Status: She is alert and oriented to person, place, and time.  Psychiatric:        Mood and Affect: Mood normal.        Behavior: Behavior normal.     ED Results / Procedures / Treatments   Labs (all labs ordered are listed, but only abnormal results are displayed) Labs Reviewed  COMPREHENSIVE METABOLIC PANEL - Abnormal; Notable for the following components:      Result Value   CO2 18 (*)    Glucose, Bld 115 (*)    Creatinine, Ser 1.22 (*)    Calcium 8.8 (*)    GFR, Estimated 49 (*)    All other components within normal limits  BLOOD GAS, VENOUS - Abnormal; Notable for the following components:   pCO2, Ven 33 (*)    Bicarbonate 19.2 (*)    Acid-base deficit 5.4 (*)    All other components within normal limits  CBC WITH DIFFERENTIAL/PLATELET - Abnormal; Notable for the following components:   WBC 11.5 (*)    RBC 3.86 (*)    Hemoglobin 11.7 (*)    Neutro Abs 9.0 (*)    Eosinophils Absolute 0.6 (*)    All other components within normal limits  RESP PANEL BY RT-PCR (RSV, FLU A&B, COVID)  RVPGX2  BRAIN NATRIURETIC PEPTIDE  MAGNESIUM    EKG EKG Interpretation Date/Time:  Tuesday April 02 2023 07:52:43 EDT Ventricular Rate:  88 PR Interval:  181 QRS Duration:  85 QT Interval:  348 QTC Calculation: 421 R Axis:   68  Text  Interpretation: Sinus rhythm Borderline T wave abnormalities Confirmed by Gloris Manchester [phone]) on 04/02/2023 8:45:35 AM  Radiology DG Chest Port 1 View  Result Date: 04/02/2023 CLINICAL DATA:  Cough. EXAM: PORTABLE CHEST 1 VIEW COMPARISON:  None Available. FINDINGS: Clear lungs. Normal heart size and mediastinal contours. No pleural effusion or pneumothorax. Visualized bones and upper abdomen are unremarkable. IMPRESSION: No evidence  of acute cardiopulmonary disease. Electronically Signed   By: Orvan Falconer M.D.   On: 04/02/2023 08:19    Procedures Procedures    Medications Ordered in ED Medications  methylPREDNISolone sodium succinate (SOLU-MEDROL) 125 mg/2 mL injection 125 mg (125 mg Intravenous Given 04/02/23 0814)  albuterol (PROVENTIL) (2.5 MG/3ML) 0.083% nebulizer solution (10 mg/hr Nebulization Given 04/02/23 0865)    ED Course/ Medical Decision Making/ A&P                                 Medical Decision Making Amount and/or Complexity of Data Reviewed Labs: ordered. Radiology: ordered.  Risk Prescription drug management.   This patient presents to the ED for concern of cough and shortness of breath, this involves an extensive number of treatment options, and is a complaint that carries with it a high risk of complications and morbidity.  The differential diagnosis includes asthma exacerbation, pneumonia, allergic reaction, URI, bronchitis   Co morbidities that complicate the patient evaluation  Asthma, anxiety, arthritis, HTN, GERD, fibromyalgia   Additional history obtained:  Additional history obtained from N/A External records from outside source obtained and reviewed including EMR   Lab Tests:  I Ordered, and personally interpreted labs.  The pertinent results include: Mild leukocytosis, baseline creatinine, non-anion gap metabolic acidosis (present on prior lab work)   Imaging Studies ordered:  I ordered imaging studies including chest x-ray I  independently visualized and interpreted imaging which showed no acute findings I agree with the radiologist interpretation   Cardiac Monitoring: / EKG:  The patient was maintained on a cardiac monitor.  I personally viewed and interpreted the cardiac monitored which showed an underlying rhythm of: Sinus rhythm   Problem List / ED Course / Critical interventions / Medication management  Patient presenting for cough and shortness of breath having worsening over the past 4 days.  Symptoms have been refractory to home nebulizer breathing treatments.  On arrival in the ED, patient has coarse cough present on exam.  Wheezing and rhonchi are present on lung auscultation.  When not coughing, patient's breathing is unlabored.  She is able speak in complete sentences.  Treatment was initiated with Solu-Medrol and continuous albuterol.  Workup was initiated.  Chest x-ray did not show acute findings.  Lab work is notable for mild leukocytosis.  SpO2 remained normal on room air.  Patient was prescribed ongoing prednisone and azithromycin for treatment of bronchitis.  She was advised to return for any worsening of symptoms.  She was discharged in stable condition. I ordered medication including albuterol and Solu-Medrol for asthma exacerbation/bronchitis Reevaluation of the patient after these medicines showed that the patient improved I have reviewed the patients home medicines and have made adjustments as needed   Social Determinants of Health:  Has PCP         Final Clinical Impression(s) / ED Diagnoses Final diagnoses:  Bronchitis    Rx / DC Orders ED Discharge Orders          Ordered    predniSONE (DELTASONE) 10 MG tablet  Daily        04/02/23 1011    azithromycin (ZITHROMAX) 250 MG tablet  Daily        04/02/23 1011              Gloris Manchester, MD 04/02/23 1012

## 2023-04-02 NOTE — Discharge Instructions (Signed)
A prescription for prednisone and azithromycin was sent to your pharmacy.  Take as prescribed.  Continue breathing treatments at home as needed.  Return to the emergency department for any worsening symptoms.

## 2023-04-02 NOTE — ED Triage Notes (Signed)
Pt here with shob and cough since Friday.  Hx asthma, has been using inhalers and nebulizer without relief.

## 2023-04-09 DIAGNOSIS — H9311 Tinnitus, right ear: Secondary | ICD-10-CM | POA: Diagnosis not present

## 2023-04-09 DIAGNOSIS — M791 Myalgia, unspecified site: Secondary | ICD-10-CM | POA: Diagnosis not present

## 2023-04-10 ENCOUNTER — Telehealth: Payer: Self-pay | Admitting: *Deleted

## 2023-04-10 NOTE — Telephone Encounter (Signed)
Transition Care Management Follow-up Telephone Call Date of discharge and from where: [facility] 04/02/[facility]23 How have you been since you were released from the hospital? Feeling better has bronchitis and knows how to work with that  Any questions or concerns? No  Items Reviewed: Did the pt receive and understand the discharge instructions provided? Yes  Medications obtained and verified? Yes  Other? No  Any new allergies since your discharge? No  Dietary orders reviewed? No Do you have support at home? Yes     Follow up appointments reviewed:  PCP Hospital f/u appt confirmed? Yes  Saw Dr Channing Mutters yesterday   Are transportation arrangements needed? Patient has transport and medications If their condition worsens, is the pt aware to call PCP or go to the Emergency Dept.? Yes Was the patient provided with contact information for the PCP's office or ED? Yes Was to pt encouraged to call back with questions or concerns? Yes

## 2023-04-10 NOTE — Telephone Encounter (Signed)
Transition Care Management Unsuccessful Follow-up Telephone Call  Date of discharge and from where:  [name] 04[facility]  Attempts:  1st Attempt  Reason for unsuccessful TCM follow-up call:  No answer/busy

## 2023-04-26 ENCOUNTER — Telehealth: Payer: Self-pay | Admitting: Orthopedic Surgery

## 2023-04-26 NOTE — Telephone Encounter (Signed)
Received call from Cinnamon at [facility] checking on records request from 02/04/23. I advised records faxed 02/26/23. She has not received. I verified fax number and refaxed records [phone]

## 2023-05-21 ENCOUNTER — Other Ambulatory Visit (HOSPITAL_COMMUNITY): Payer: Self-pay | Admitting: Adult Health

## 2023-05-21 DIAGNOSIS — Z1231 Encounter for screening mammogram for malignant neoplasm of breast: Secondary | ICD-10-CM

## 2023-05-28 DIAGNOSIS — R6889 Other general symptoms and signs: Secondary | ICD-10-CM | POA: Diagnosis not present

## 2023-05-28 DIAGNOSIS — Z87891 Personal history of nicotine dependence: Secondary | ICD-10-CM | POA: Diagnosis not present

## 2023-05-28 DIAGNOSIS — R059 Cough, unspecified: Secondary | ICD-10-CM | POA: Diagnosis not present

## 2023-05-28 DIAGNOSIS — R0600 Dyspnea, unspecified: Secondary | ICD-10-CM | POA: Diagnosis not present

## 2023-05-28 DIAGNOSIS — R079 Chest pain, unspecified: Secondary | ICD-10-CM | POA: Diagnosis not present

## 2023-05-28 DIAGNOSIS — Z743 Need for continuous supervision: Secondary | ICD-10-CM | POA: Diagnosis not present

## 2023-05-28 DIAGNOSIS — Z20822 Contact with and (suspected) exposure to covid-19: Secondary | ICD-10-CM | POA: Diagnosis not present

## 2023-05-28 DIAGNOSIS — R0602 Shortness of breath: Secondary | ICD-10-CM | POA: Diagnosis not present

## 2023-05-28 DIAGNOSIS — R0789 Other chest pain: Secondary | ICD-10-CM | POA: Diagnosis not present

## 2023-06-10 ENCOUNTER — Ambulatory Visit (HOSPITAL_COMMUNITY)
Admission: RE | Admit: 2023-06-10 | Discharge: 2023-06-10 | Disposition: A | Discharge status: Home / Self Care | Payer: Medicare Other | Source: Ambulatory Visit | Attending: Adult Health | Admitting: Adult Health

## 2023-06-10 DIAGNOSIS — Z1231 Encounter for screening mammogram for malignant neoplasm of breast: Secondary | ICD-10-CM | POA: Insufficient documentation

## 2023-06-13 DIAGNOSIS — Z961 Presence of intraocular lens: Secondary | ICD-10-CM | POA: Diagnosis not present

## 2023-06-13 DIAGNOSIS — H348122 Central retinal vein occlusion, left eye, stable: Secondary | ICD-10-CM | POA: Diagnosis not present

## 2023-06-13 DIAGNOSIS — H524 Presbyopia: Secondary | ICD-10-CM | POA: Diagnosis not present

## 2023-07-15 DIAGNOSIS — Z79899 Other long term (current) drug therapy: Secondary | ICD-10-CM | POA: Diagnosis not present

## 2023-07-15 DIAGNOSIS — N1832 Chronic kidney disease, stage 3b: Secondary | ICD-10-CM | POA: Diagnosis not present

## 2023-07-15 DIAGNOSIS — I10 Essential (primary) hypertension: Secondary | ICD-10-CM | POA: Diagnosis not present

## 2023-07-22 ENCOUNTER — Other Ambulatory Visit: Payer: Self-pay | Admitting: Adult Health

## 2023-07-22 DIAGNOSIS — G4700 Insomnia, unspecified: Secondary | ICD-10-CM | POA: Diagnosis not present

## 2023-07-22 DIAGNOSIS — N1831 Chronic kidney disease, stage 3a: Secondary | ICD-10-CM | POA: Diagnosis not present

## 2023-07-22 DIAGNOSIS — I10 Essential (primary) hypertension: Secondary | ICD-10-CM | POA: Diagnosis not present

## 2023-08-02 DIAGNOSIS — M9902 Segmental and somatic dysfunction of thoracic region: Secondary | ICD-10-CM | POA: Diagnosis not present

## 2023-08-02 DIAGNOSIS — M546 Pain in thoracic spine: Secondary | ICD-10-CM | POA: Diagnosis not present

## 2023-08-02 DIAGNOSIS — M542 Cervicalgia: Secondary | ICD-10-CM | POA: Diagnosis not present

## 2023-08-02 DIAGNOSIS — M9901 Segmental and somatic dysfunction of cervical region: Secondary | ICD-10-CM | POA: Diagnosis not present

## 2023-08-15 DIAGNOSIS — M5431 Sciatica, right side: Secondary | ICD-10-CM | POA: Diagnosis not present

## 2023-08-15 DIAGNOSIS — M545 Low back pain, unspecified: Secondary | ICD-10-CM | POA: Diagnosis not present

## 2023-08-15 DIAGNOSIS — R030 Elevated blood-pressure reading, without diagnosis of hypertension: Secondary | ICD-10-CM | POA: Diagnosis not present

## 2023-09-02 DIAGNOSIS — M79672 Pain in left foot: Secondary | ICD-10-CM | POA: Diagnosis not present

## 2023-09-02 DIAGNOSIS — M7742 Metatarsalgia, left foot: Secondary | ICD-10-CM | POA: Diagnosis not present

## 2023-09-04 ENCOUNTER — Ambulatory Visit: Payer: Medicare Other | Admitting: Adult Health

## 2023-09-04 ENCOUNTER — Encounter: Payer: Self-pay | Admitting: Adult Health

## 2023-09-04 ENCOUNTER — Other Ambulatory Visit (HOSPITAL_COMMUNITY)
Admission: RE | Admit: 2023-09-04 | Discharge: 2023-09-04 | Disposition: A | Discharge status: Home / Self Care | Payer: Medicare Other | Source: Ambulatory Visit | Attending: Adult Health | Admitting: Adult Health

## 2023-09-04 VITALS — BP 123/77 | HR 79 | Ht 68.0 in | Wt 186.5 lb

## 2023-09-04 DIAGNOSIS — Z1151 Encounter for screening for human papillomavirus (HPV): Secondary | ICD-10-CM | POA: Diagnosis not present

## 2023-09-04 DIAGNOSIS — Z1331 Encounter for screening for depression: Secondary | ICD-10-CM

## 2023-09-04 DIAGNOSIS — Z01419 Encounter for gynecological examination (general) (routine) without abnormal findings: Secondary | ICD-10-CM

## 2023-09-04 DIAGNOSIS — Z1211 Encounter for screening for malignant neoplasm of colon: Secondary | ICD-10-CM

## 2023-09-04 DIAGNOSIS — R1031 Right lower quadrant pain: Secondary | ICD-10-CM | POA: Diagnosis not present

## 2023-09-04 DIAGNOSIS — R5383 Other fatigue: Secondary | ICD-10-CM

## 2023-09-04 DIAGNOSIS — R631 Polydipsia: Secondary | ICD-10-CM | POA: Diagnosis not present

## 2023-09-04 LAB — HEMOCCULT GUIAC POC 1CARD (OFFICE): Fecal Occult Blood, POC: NEGATIVE

## 2023-09-04 NOTE — Progress Notes (Signed)
 Patient ID: [name], female   DOB: Jan 27, 1956, 68 y.o.   MRN: [id number] History of Present Illness: [name] is a 68 year old white female, married, PM in for a well woman gyn exam and pap.  She is complaining of always being thirsty, hungry for sweets, is tired no energy and has pain in RLQ, had normal CT and US  in 2023. She had spot on abdomen that burned last week like cigarette burn. She was seen at Urgent Care she says 08/15/23 and prescribed muscle relaxer and prednisone  for back pain, on the right. She feels like she has kidney issues.   PCP is Dr [name]    Current Medications, Allergies, Past Medical History, Past Surgical History, Family History and Social History were reviewed in [facility] electronic medical record.     Review of Systems: Patient denies any headaches, hearing loss,  blurred vision, shortness of breath, chest pain, abdominal pain, problems with bowel movements, urination(urine dark at times), or intercourse(not active). No joint pain or mood swings. Itches at clitoral area at times.  See HPI for positives.   Physical Exam:BP 123/77 (BP Location: Left Arm, Patient Position: Sitting, Cuff Size: Normal)   Pulse 79   Ht 5\' 8"  (1.727 m)   Wt 186 lb 8 oz (84.6 kg)   LMP 12/13/2015   BMI 28.36 kg/m   General:  Well developed, well nourished, no acute distress Skin:  Warm and dry Neck:  Midline trachea, normal thyroid , good ROM, no lymphadenopathy,no carotid bruits heard Lungs; Clear to auscultation bilaterally Breast:  No dominant palpable mass, retraction, or nipple discharge Cardiovascular: Regular rate and rhythm Abdomen:  Soft, non tender, no hepatosplenomegaly, no hernia felt  Pelvic:  External genitalia is normal in appearance, no lesions.  The vagina is pale. Urethra has no lesions or masses. The cervix is smooth, pap with HR HPV genotyping performed.  Uterus is felt to be normal size, shape, and contour.  No adnexal masses or tenderness  noted.Bladder is non tender, no masses felt. Rectal: Good sphincter tone, no polyps, or hemorrhoids felt.  Hemoccult negative. Extremities/musculoskeletal:  No swelling or varicosities noted, no clubbing or cyanosis Psych:  No mood changes, alert and cooperative,seems happy AA is 0 Fall risk is moderate    09/04/2023   11:22 AM 07/27/2021    9:08 AM 07/27/2021    8:53 AM  Depression screen PHQ 2/9  Decreased Interest 0 0 0  Down, Depressed, Hopeless 2 0 0  PHQ - 2 Score 2 0 0  Altered sleeping 3 0 0  Tired, decreased energy 3 0 0  Change in appetite 3 0 0  Feeling bad or failure about yourself  0 0 0  Trouble concentrating 0 0 0  Moving slowly or fidgety/restless 0 0 0  Suicidal thoughts 0 0 0  PHQ-9 Score 11 0 0    On meds    09/04/2023   11:23 AM 07/27/2021    9:08 AM 07/27/2021    8:53 AM  GAD 7 : Generalized Anxiety Score  Nervous, Anxious, on Edge 0 0 0  Control/stop worrying 0 0 0  Worry too much - different things 0 0 0  Trouble relaxing 0 0 0  Restless 0 0 0  Easily annoyed or irritable 0 0 0  Afraid - awful might happen 0 0 0  Total GAD 7 Score 0 0 0      Upstream - 09/04/23 1140       Pregnancy Intention Screening  Does the patient want to become pregnant in the next year? N/A    Does the patient's partner want to become pregnant in the next year? N/A    Would the patient like to discuss contraceptive options today? N/A      Contraception Wrap Up   Current Method Female Sterilization   PM   End Method Female Sterilization   PM   Contraception Counseling Provided No            Examination chaperoned by Lisa Aschoff LPN  Impression and plan: 1. Encounter for gynecological examination with Papanicolaou smear of cervix (Primary) Pap sent  If normal can be last pap Physical in 2 years Labs with PCP Mammogram was negative 06/10/23 Colonoscopy per GI  - Cytology - PAP( [facility])  2. Encounter for screening fecal occult blood testing Hemoccult was  negative  - POCT occult blood stool  3. Tired Tired, no energy Will check labs  - CBC - Comprehensive metabolic panel  4. Always thirsty Will check CMP and A1c  - Comprehensive metabolic panel - Hemoglobin A1c  5. RLQ abdominal pain Has more to groin area, chronic

## 2023-09-05 LAB — COMPREHENSIVE METABOLIC PANEL
ALT: 10 [IU]/L (ref 0–32)
AST: 16 [IU]/L (ref 0–40)
Albumin: 4.1 g/dL (ref 3.9–4.9)
Alkaline Phosphatase: 64 [IU]/L (ref 44–121)
BUN/Creatinine Ratio: 16 (ref 12–28)
BUN: 19 mg/dL (ref 8–27)
Bilirubin Total: <0.2 mg/dL (ref 0.0–1.2)
CO2: 21 mmol/L (ref 20–29)
Calcium: 9.3 mg/dL (ref 8.7–10.3)
Chloride: 108 mmol/L — ABNORMAL HIGH (ref 96–106)
Creatinine, Ser: 1.18 mg/dL — ABNORMAL HIGH (ref 0.57–1.00)
Globulin, Total: 2.1 g/dL (ref 1.5–4.5)
Glucose: 97 mg/dL (ref 70–99)
Potassium: 5.4 mmol/L — ABNORMAL HIGH (ref 3.5–5.2)
Sodium: 143 mmol/L (ref 134–144)
Total Protein: 6.2 g/dL (ref 6.0–8.5)
eGFR: 51 mL/min/{1.73_m2} — ABNORMAL LOW (ref >59)

## 2023-09-05 LAB — CBC
Hematocrit: 39.4 % (ref 34.0–46.6)
Hemoglobin: 12.5 g/dL (ref 11.1–15.9)
MCH: 28.3 pg (ref 26.6–33.0)
MCHC: 31.7 g/dL (ref 31.5–35.7)
MCV: 89 fL (ref 79–97)
Platelets: 332 10*3/uL (ref 150–450)
RBC: 4.42 x10E6/uL (ref 3.77–5.28)
RDW: 14.3 % (ref 11.7–15.4)
WBC: 9.9 10*3/uL (ref 3.4–10.8)

## 2023-09-05 LAB — HEMOGLOBIN A1C
Est. average glucose Bld gHb Est-mCnc: 120 mg/dL
Hgb A1c MFr Bld: 5.8 % — ABNORMAL HIGH (ref 4.8–5.6)

## 2023-09-06 LAB — CYTOLOGY - PAP
Comment: NEGATIVE
Diagnosis: NEGATIVE
High risk HPV: NEGATIVE

## 2023-09-10 DIAGNOSIS — H6691 Otitis media, unspecified, right ear: Secondary | ICD-10-CM | POA: Diagnosis not present

## 2023-09-28 ENCOUNTER — Other Ambulatory Visit: Payer: Self-pay | Admitting: Gastroenterology

## 2023-10-15 DIAGNOSIS — I10 Essential (primary) hypertension: Secondary | ICD-10-CM | POA: Diagnosis not present

## 2023-10-15 DIAGNOSIS — G4700 Insomnia, unspecified: Secondary | ICD-10-CM | POA: Diagnosis not present

## 2023-10-15 DIAGNOSIS — Z79899 Other long term (current) drug therapy: Secondary | ICD-10-CM | POA: Diagnosis not present

## 2023-10-15 DIAGNOSIS — N1831 Chronic kidney disease, stage 3a: Secondary | ICD-10-CM | POA: Diagnosis not present

## 2023-10-22 DIAGNOSIS — I10 Essential (primary) hypertension: Secondary | ICD-10-CM | POA: Diagnosis not present

## 2023-10-22 DIAGNOSIS — N1831 Chronic kidney disease, stage 3a: Secondary | ICD-10-CM | POA: Diagnosis not present

## 2023-10-22 DIAGNOSIS — E875 Hyperkalemia: Secondary | ICD-10-CM | POA: Diagnosis not present

## 2023-11-14 ENCOUNTER — Telehealth: Payer: Self-pay

## 2023-11-14 NOTE — Progress Notes (Signed)
   11/14/2023  Patient ID: [name], female   DOB: [birth year], 68 y.o.   MRN: [id number]   Patient appeared on insurance report for not passing the quality metrics in 2024:  Medication Adherence for Cholesterol (MAC)   Outreach to the patient was not needed today.  Rosuvastatin was stopped due to muscle aches last summer. Muscle aches resolved and rosuvastatin was never resumed. She does not fall into SUPD or SPC per my assessment. Has not had lipid panel since stopping therapy. Labs and follow up w/ PCP scheduled in June.  Plan:  Review labs/PCP visit on 6/19. If restarting statin follow up with adherence monitoring  Fayette Pho, PharmD

## 2024-01-20 DIAGNOSIS — L820 Inflamed seborrheic keratosis: Secondary | ICD-10-CM | POA: Diagnosis not present

## 2024-01-20 DIAGNOSIS — L821 Other seborrheic keratosis: Secondary | ICD-10-CM | POA: Diagnosis not present

## 2024-01-29 DIAGNOSIS — I10 Essential (primary) hypertension: Secondary | ICD-10-CM | POA: Diagnosis not present

## 2024-01-29 DIAGNOSIS — Z79899 Other long term (current) drug therapy: Secondary | ICD-10-CM | POA: Diagnosis not present

## 2024-01-29 DIAGNOSIS — N1831 Chronic kidney disease, stage 3a: Secondary | ICD-10-CM | POA: Diagnosis not present

## 2024-01-29 DIAGNOSIS — E875 Hyperkalemia: Secondary | ICD-10-CM | POA: Diagnosis not present

## 2024-02-03 DIAGNOSIS — L820 Inflamed seborrheic keratosis: Secondary | ICD-10-CM | POA: Diagnosis not present

## 2024-02-05 DIAGNOSIS — M797 Fibromyalgia: Secondary | ICD-10-CM | POA: Diagnosis not present

## 2024-02-05 DIAGNOSIS — E875 Hyperkalemia: Secondary | ICD-10-CM | POA: Diagnosis not present

## 2024-02-05 DIAGNOSIS — N1831 Chronic kidney disease, stage 3a: Secondary | ICD-10-CM | POA: Diagnosis not present

## 2024-02-05 DIAGNOSIS — G4700 Insomnia, unspecified: Secondary | ICD-10-CM | POA: Diagnosis not present

## 2024-03-16 DIAGNOSIS — Z96641 Presence of right artificial hip joint: Secondary | ICD-10-CM | POA: Diagnosis not present

## 2024-03-16 DIAGNOSIS — M25552 Pain in left hip: Secondary | ICD-10-CM | POA: Diagnosis not present

## 2024-04-22 ENCOUNTER — Other Ambulatory Visit: Payer: Self-pay | Admitting: Gastroenterology

## 2024-05-05 DIAGNOSIS — E875 Hyperkalemia: Secondary | ICD-10-CM | POA: Diagnosis not present

## 2024-05-05 DIAGNOSIS — N1831 Chronic kidney disease, stage 3a: Secondary | ICD-10-CM | POA: Diagnosis not present

## 2024-05-05 DIAGNOSIS — I10 Essential (primary) hypertension: Secondary | ICD-10-CM | POA: Diagnosis not present

## 2024-05-12 DIAGNOSIS — N1831 Chronic kidney disease, stage 3a: Secondary | ICD-10-CM | POA: Diagnosis not present

## 2024-05-12 DIAGNOSIS — Z23 Encounter for immunization: Secondary | ICD-10-CM | POA: Diagnosis not present

## 2024-05-12 DIAGNOSIS — I10 Essential (primary) hypertension: Secondary | ICD-10-CM | POA: Diagnosis not present

## 2024-05-12 DIAGNOSIS — E875 Hyperkalemia: Secondary | ICD-10-CM | POA: Diagnosis not present

## 2024-05-19 ENCOUNTER — Other Ambulatory Visit (HOSPITAL_COMMUNITY): Payer: Self-pay | Admitting: Adult Health

## 2024-05-19 DIAGNOSIS — Z1231 Encounter for screening mammogram for malignant neoplasm of breast: Secondary | ICD-10-CM

## 2024-06-11 ENCOUNTER — Ambulatory Visit (HOSPITAL_COMMUNITY)
Admission: RE | Admit: 2024-06-11 | Discharge: 2024-06-11 | Disposition: A | Discharge status: Home / Self Care | Source: Ambulatory Visit | Attending: Adult Health | Admitting: Adult Health

## 2024-06-11 ENCOUNTER — Encounter (HOSPITAL_COMMUNITY): Payer: Self-pay

## 2024-06-11 DIAGNOSIS — Z1231 Encounter for screening mammogram for malignant neoplasm of breast: Secondary | ICD-10-CM | POA: Insufficient documentation

## 2024-06-15 ENCOUNTER — Ambulatory Visit: Payer: Self-pay | Admitting: Adult Health

## 2024-06-15 DIAGNOSIS — H348112 Central retinal vein occlusion, right eye, stable: Secondary | ICD-10-CM | POA: Diagnosis not present

## 2024-06-15 DIAGNOSIS — H524 Presbyopia: Secondary | ICD-10-CM | POA: Diagnosis not present

## 2024-08-20 ENCOUNTER — Encounter: Payer: Self-pay | Admitting: Gastroenterology
